# Patient Record
Sex: Female | Born: 1945
Health system: Southern US, Community
[De-identification: ages and names within clinical notes are randomized; demographics above are authoritative.]

## PROBLEM LIST (undated history)

## (undated) DIAGNOSIS — C801 Malignant (primary) neoplasm, unspecified: Secondary | ICD-10-CM

## (undated) DIAGNOSIS — K219 Gastro-esophageal reflux disease without esophagitis: Secondary | ICD-10-CM

## (undated) DIAGNOSIS — F32A Depression, unspecified: Secondary | ICD-10-CM

## (undated) DIAGNOSIS — R7303 Prediabetes: Secondary | ICD-10-CM

## (undated) DIAGNOSIS — J449 Chronic obstructive pulmonary disease, unspecified: Secondary | ICD-10-CM

## (undated) DIAGNOSIS — N189 Chronic kidney disease, unspecified: Secondary | ICD-10-CM

## (undated) DIAGNOSIS — J40 Bronchitis, not specified as acute or chronic: Secondary | ICD-10-CM

## (undated) DIAGNOSIS — R0600 Dyspnea, unspecified: Secondary | ICD-10-CM

## (undated) DIAGNOSIS — M5432 Sciatica, left side: Principal | ICD-10-CM

## (undated) DIAGNOSIS — F329 Major depressive disorder, single episode, unspecified: Secondary | ICD-10-CM

## (undated) DIAGNOSIS — I7090 Unspecified atherosclerosis: Secondary | ICD-10-CM

## (undated) DIAGNOSIS — I341 Nonrheumatic mitral (valve) prolapse: Secondary | ICD-10-CM

## (undated) DIAGNOSIS — I10 Essential (primary) hypertension: Secondary | ICD-10-CM

## (undated) DIAGNOSIS — Z720 Tobacco use: Secondary | ICD-10-CM

## (undated) DIAGNOSIS — F419 Anxiety disorder, unspecified: Secondary | ICD-10-CM

## (undated) DIAGNOSIS — J189 Pneumonia, unspecified organism: Secondary | ICD-10-CM

## (undated) DIAGNOSIS — C3490 Malignant neoplasm of unspecified part of unspecified bronchus or lung: Secondary | ICD-10-CM

## (undated) DIAGNOSIS — A809 Acute poliomyelitis, unspecified: Secondary | ICD-10-CM

## (undated) HISTORY — PX: BACK SURGERY: SHX140

## (undated) HISTORY — DX: Sciatica, left side: M54.32

## (undated) HISTORY — PX: TONSILLECTOMY: SUR1361

## (undated) HISTORY — DX: Chronic obstructive pulmonary disease, unspecified: J44.9

## (undated) HISTORY — PX: EYE SURGERY: SHX253

## (undated) HISTORY — PX: OTHER SURGICAL HISTORY: SHX169

---

## 1983-05-24 HISTORY — PX: ABDOMINAL HYSTERECTOMY: SHX81

## 1998-12-14 ENCOUNTER — Emergency Department (HOSPITAL_COMMUNITY): Admission: EM | Admit: 1998-12-14 | Discharge: 1998-12-14 | Payer: Self-pay | Admitting: Emergency Medicine

## 1999-10-27 ENCOUNTER — Emergency Department (HOSPITAL_COMMUNITY): Admission: EM | Admit: 1999-10-27 | Discharge: 1999-10-27 | Payer: Self-pay | Admitting: Emergency Medicine

## 1999-10-28 ENCOUNTER — Encounter: Payer: Self-pay | Admitting: Emergency Medicine

## 2001-01-25 ENCOUNTER — Emergency Department (HOSPITAL_COMMUNITY): Admission: EM | Admit: 2001-01-25 | Discharge: 2001-01-25 | Payer: Self-pay | Admitting: Emergency Medicine

## 2001-10-19 ENCOUNTER — Encounter: Payer: Self-pay | Admitting: Family Medicine

## 2001-10-19 ENCOUNTER — Ambulatory Visit (HOSPITAL_COMMUNITY): Admission: RE | Admit: 2001-10-19 | Discharge: 2001-10-19 | Payer: Self-pay | Admitting: Family Medicine

## 2002-03-10 ENCOUNTER — Emergency Department (HOSPITAL_COMMUNITY): Admission: EM | Admit: 2002-03-10 | Discharge: 2002-03-10 | Payer: Self-pay | Admitting: Emergency Medicine

## 2002-04-26 ENCOUNTER — Ambulatory Visit (HOSPITAL_COMMUNITY): Admission: RE | Admit: 2002-04-26 | Discharge: 2002-04-26 | Payer: Self-pay | Admitting: Orthopedic Surgery

## 2002-04-26 ENCOUNTER — Encounter: Payer: Self-pay | Admitting: Orthopedic Surgery

## 2002-05-17 ENCOUNTER — Encounter: Payer: Self-pay | Admitting: Orthopedic Surgery

## 2002-05-17 ENCOUNTER — Encounter: Admission: RE | Admit: 2002-05-17 | Discharge: 2002-05-17 | Payer: Self-pay | Admitting: Orthopedic Surgery

## 2002-05-24 ENCOUNTER — Emergency Department (HOSPITAL_COMMUNITY): Admission: EM | Admit: 2002-05-24 | Discharge: 2002-05-24 | Payer: Self-pay | Admitting: Emergency Medicine

## 2002-05-24 ENCOUNTER — Encounter: Payer: Self-pay | Admitting: Emergency Medicine

## 2002-05-31 ENCOUNTER — Encounter: Admission: RE | Admit: 2002-05-31 | Discharge: 2002-05-31 | Payer: Self-pay | Admitting: Orthopedic Surgery

## 2002-11-22 ENCOUNTER — Encounter: Payer: Self-pay | Admitting: Orthopedic Surgery

## 2002-11-22 ENCOUNTER — Encounter: Admission: RE | Admit: 2002-11-22 | Discharge: 2002-11-22 | Payer: Self-pay | Admitting: Orthopedic Surgery

## 2002-12-15 ENCOUNTER — Ambulatory Visit (HOSPITAL_COMMUNITY): Admission: RE | Admit: 2002-12-15 | Discharge: 2002-12-15 | Payer: Self-pay | Admitting: Orthopedic Surgery

## 2002-12-15 ENCOUNTER — Encounter: Payer: Self-pay | Admitting: Orthopedic Surgery

## 2003-08-13 ENCOUNTER — Other Ambulatory Visit: Admission: RE | Admit: 2003-08-13 | Discharge: 2003-08-13 | Payer: Self-pay | Admitting: Family Medicine

## 2005-05-30 ENCOUNTER — Ambulatory Visit (HOSPITAL_COMMUNITY): Admission: RE | Admit: 2005-05-30 | Discharge: 2005-05-30 | Payer: Self-pay | Admitting: Family Medicine

## 2005-08-12 ENCOUNTER — Encounter: Admission: RE | Admit: 2005-08-12 | Discharge: 2005-08-12 | Payer: Self-pay | Admitting: Orthopedic Surgery

## 2008-02-14 ENCOUNTER — Encounter: Admission: RE | Admit: 2008-02-14 | Discharge: 2008-02-14 | Payer: Self-pay | Admitting: Orthopedic Surgery

## 2009-02-06 ENCOUNTER — Emergency Department (HOSPITAL_COMMUNITY): Admission: EM | Admit: 2009-02-06 | Discharge: 2009-02-06 | Payer: Self-pay | Admitting: Emergency Medicine

## 2009-02-16 ENCOUNTER — Encounter: Admission: RE | Admit: 2009-02-16 | Discharge: 2009-02-16 | Payer: Self-pay | Admitting: Orthopedic Surgery

## 2009-03-06 ENCOUNTER — Ambulatory Visit (HOSPITAL_COMMUNITY): Admission: RE | Admit: 2009-03-06 | Discharge: 2009-03-06 | Payer: Self-pay | Admitting: Orthopedic Surgery

## 2009-04-13 ENCOUNTER — Ambulatory Visit (HOSPITAL_COMMUNITY): Admission: RE | Admit: 2009-04-13 | Discharge: 2009-04-14 | Payer: Self-pay | Admitting: Orthopaedic Surgery

## 2009-08-10 ENCOUNTER — Encounter: Admission: RE | Admit: 2009-08-10 | Discharge: 2009-10-12 | Payer: Self-pay | Admitting: Orthopaedic Surgery

## 2010-08-25 LAB — COMPREHENSIVE METABOLIC PANEL
ALT: 13 U/L (ref 0–35)
Alkaline Phosphatase: 84 U/L (ref 39–117)
BUN: 18 mg/dL (ref 6–23)
Chloride: 102 mEq/L (ref 96–112)
Creatinine, Ser: 1.11 mg/dL (ref 0.4–1.2)
GFR calc Af Amer: 60 mL/min — ABNORMAL LOW (ref >60)
Potassium: 4.2 mEq/L (ref 3.5–5.1)

## 2010-08-25 LAB — CBC
HCT: 39.3 % (ref 36.0–46.0)
Hemoglobin: 13.5 g/dL (ref 12.0–15.0)
MCHC: 34.4 g/dL (ref 30.0–36.0)
Platelets: 292 10*3/uL (ref 150–400)
WBC: 7.2 10*3/uL (ref 4.0–10.5)

## 2010-08-25 LAB — DIFFERENTIAL
Eosinophils Absolute: 0.3 10*3/uL (ref 0.0–0.7)
Lymphocytes Relative: 23 % (ref 12–46)
Monocytes Absolute: 0.8 10*3/uL (ref 0.1–1.0)
Monocytes Relative: 11 % (ref 3–12)
Neutro Abs: 4.5 10*3/uL (ref 1.7–7.7)

## 2010-12-16 ENCOUNTER — Ambulatory Visit
Admission: RE | Admit: 2010-12-16 | Discharge: 2010-12-16 | Disposition: A | Discharge status: Home / Self Care | Payer: Medicare Other | Source: Ambulatory Visit | Attending: Family Medicine | Admitting: Family Medicine

## 2010-12-16 ENCOUNTER — Other Ambulatory Visit: Payer: Self-pay | Admitting: Family Medicine

## 2010-12-16 DIAGNOSIS — R29898 Other symptoms and signs involving the musculoskeletal system: Secondary | ICD-10-CM

## 2011-02-18 ENCOUNTER — Other Ambulatory Visit (HOSPITAL_COMMUNITY): Payer: Medicare Other

## 2011-02-18 ENCOUNTER — Emergency Department (HOSPITAL_COMMUNITY): Payer: Medicare Other

## 2011-02-18 ENCOUNTER — Inpatient Hospital Stay (HOSPITAL_COMMUNITY): Payer: Medicare Other

## 2011-02-18 ENCOUNTER — Inpatient Hospital Stay (HOSPITAL_COMMUNITY)
Admission: EM | Admit: 2011-02-18 | Discharge: 2011-02-18 | DRG: 313 | Disposition: A | Discharge status: Home / Self Care | Payer: Medicare Other | Attending: Internal Medicine | Admitting: Internal Medicine

## 2011-02-18 DIAGNOSIS — F3289 Other specified depressive episodes: Secondary | ICD-10-CM | POA: Diagnosis present

## 2011-02-18 DIAGNOSIS — I10 Essential (primary) hypertension: Secondary | ICD-10-CM | POA: Diagnosis present

## 2011-02-18 DIAGNOSIS — Z79899 Other long term (current) drug therapy: Secondary | ICD-10-CM

## 2011-02-18 DIAGNOSIS — Z7982 Long term (current) use of aspirin: Secondary | ICD-10-CM

## 2011-02-18 DIAGNOSIS — R0789 Other chest pain: Principal | ICD-10-CM | POA: Diagnosis present

## 2011-02-18 DIAGNOSIS — F17200 Nicotine dependence, unspecified, uncomplicated: Secondary | ICD-10-CM | POA: Diagnosis present

## 2011-02-18 DIAGNOSIS — K219 Gastro-esophageal reflux disease without esophagitis: Secondary | ICD-10-CM | POA: Diagnosis present

## 2011-02-18 DIAGNOSIS — Z8612 Personal history of poliomyelitis: Secondary | ICD-10-CM

## 2011-02-18 DIAGNOSIS — E669 Obesity, unspecified: Secondary | ICD-10-CM | POA: Diagnosis present

## 2011-02-18 DIAGNOSIS — F329 Major depressive disorder, single episode, unspecified: Secondary | ICD-10-CM | POA: Diagnosis present

## 2011-02-18 LAB — CBC
HCT: 34.5 % — ABNORMAL LOW (ref 36.0–46.0)
MCH: 29.8 pg (ref 26.0–34.0)
MCV: 86.5 fL (ref 78.0–100.0)
Platelets: 276 10*3/uL (ref 150–400)
RBC: 3.99 MIL/uL (ref 3.87–5.11)
RDW: 14.2 % (ref 11.5–15.5)

## 2011-02-18 LAB — CARDIAC PANEL(CRET KIN+CKTOT+MB+TROPI)
CK, MB: 3.5 ng/mL (ref 0.3–4.0)
Total CK: 113 U/L (ref 7–177)
Troponin I: <0.3 ng/mL (ref <0.30)

## 2011-02-18 LAB — DIFFERENTIAL
Eosinophils Absolute: 0.2 10*3/uL (ref 0.0–0.7)
Lymphocytes Relative: 30 % (ref 12–46)
Monocytes Absolute: 0.6 10*3/uL (ref 0.1–1.0)
Neutrophils Relative %: 57 % (ref 43–77)

## 2011-02-18 LAB — LIPID PANEL
LDL Cholesterol: 177 mg/dL — ABNORMAL HIGH (ref 0–99)
Total CHOL/HDL Ratio: 6 RATIO

## 2011-02-18 LAB — LIPASE, BLOOD: Lipase: 40 U/L (ref 11–59)

## 2011-02-18 LAB — POCT I-STAT TROPONIN I: Troponin i, poc: 0.02 ng/mL (ref 0.00–0.08)

## 2011-02-18 LAB — COMPREHENSIVE METABOLIC PANEL
Albumin: 3.5 g/dL (ref 3.5–5.2)
BUN: 20 mg/dL (ref 6–23)
Chloride: 104 mEq/L (ref 96–112)
Creatinine, Ser: 1.2 mg/dL — ABNORMAL HIGH (ref 0.50–1.10)
GFR calc Af Amer: 54 mL/min — ABNORMAL LOW (ref >60)
Glucose, Bld: 93 mg/dL (ref 70–99)

## 2011-02-18 LAB — CK TOTAL AND CKMB (NOT AT ARMC)
Relative Index: 3.1 — ABNORMAL HIGH (ref 0.0–2.5)
Total CK: 113 U/L (ref 7–177)

## 2011-02-18 LAB — D-DIMER, QUANTITATIVE: D-Dimer, Quant: 0.65 ug/mL-FEU — ABNORMAL HIGH (ref 0.00–0.48)

## 2011-02-18 MED ORDER — TECHNETIUM TO 99M ALBUMIN AGGREGATED
6.0000 | Freq: Once | INTRAVENOUS | Status: AC | PRN
  Administered 2011-02-18: 6 via INTRAVENOUS

## 2011-02-18 MED ORDER — XENON XE 133 GAS
10.0000 | GAS_FOR_INHALATION | Freq: Once | RESPIRATORY_TRACT | Status: AC | PRN
  Administered 2011-02-18: 10 via RESPIRATORY_TRACT

## 2011-03-03 NOTE — Discharge Summary (Signed)
  [name], [name]                ACCOUNT NO.:  [id number]  MEDICAL RECORD NO.:  [id number]  LOCATION:  3735                         FACILITY:  [facility]  PHYSICIAN:  Gordy Savers, MDDATE OF BIRTH:  1945/08/30  DATE OF ADMISSION:  02/18/2011 DATE OF DISCHARGE:  02/18/2011                              DISCHARGE SUMMARY   FINAL DIAGNOSIS:  Atypical chest pain.  ADDITIONAL DIAGNOSES: 1. Hypertension. 2. Tobacco abuse. 3. History of gastroesophageal reflux disease.  DISCHARGE MEDICATIONS: 1. Ambien 20 mg at bedtime p.r.n. sleep. 2. Aspirin 320 mg daily. 3. Lisinopril/hydrochlorothiazide 20/25 one daily. 4. Potassium 20 mEq daily. 5. Prozac 20 mg every morning. 6. NicoDerm patch 21 sq. cm daily.  HISTORY OF PRESENT ILLNESS:  The patient is a 65 year old African American female who has history of hypertension and ongoing tobacco use. She also has a history of gastroesophageal reflux disease.  The patient was stable until 5 p.m. of the day prior to admission when she began having intermittent left-sided chest pain.  This was described as very sharp, fleeting sensation that was brief, intermittent, and occurred without provocative factors.  Pain was not particularly worse with inspiration.  The pain occurred throughout the afternoon and evening to the point where she was very anxious about sleeping.  Denies any nausea, vomiting, or shortness of breath.  Because of persistent pain, she was seen in the ED for evaluation.  LABORATORY DATA AND HOSPITAL COURSE:  The patient was initially evaluated in the ED where D-dimer was slightly elevated at 0.65. Electrocardiogram revealed sinus bradycardia and no acute abnormalities. Chest x-ray revealed no active disease.  The patient underwent a V/Q lung scan that was very low probability for pulmonary embolism.  The patient was admitted to telemetry setting and cardiac enzymes were cycled.  These were unremarkable.  Throughout the day,  she did quite well and had no significant recurrent chest pain.  While in the hospital, she received tobacco cessation counseling.  Laboratory studies were fairly unremarkable.  Lipase was 40.  Chemistries were unremarkable, and as mentioned cardiac enzymes were normal.  Creatinine was slightly elevated 1.2 with a BUN of 20, hemoglobin 11.9, hematocrit 34.5, white count normal 6.1.  DISPOSITION:  The patient was discharged today.  She was instructed to follow up with her primary care provider next week.  Cessation of smoking encouraged.  She will be treated with a NicoDerm patch 21 sq. cm daily.  She will be discharged on her preadmission medications as mentioned above.  CONDITION ON DISCHARGE:  Stable.     Gordy Savers, MD     PFK/MEDQ  D:  02/18/2011  T:  02/18/2011  Job:  [id number]  Electronically Signed by Eleonore Chiquito MD on 03/03/2011 09:15:02 AM

## 2011-03-03 NOTE — H&P (Signed)
NAMEED, RAYSON NO.:  [id number]  MEDICAL RECORD NO.:  [id number]  LOCATION:  [facility]                         FACILITY:  [facility]  PHYSICIAN:  Gordy Savers, MDDATE OF BIRTH:  1946-01-21  DATE OF ADMISSION:  02/18/2011 DATE OF DISCHARGE:                             HISTORY & PHYSICAL   CHIEF COMPLAINTS:  Chest pain.  HISTORY OF PRESENT ILLNESS:  The patient is a 65 year old African American female who was stable until 5:00 p.m. yesterday at that time she started having intermittent left-sided chest pain.  Pain was described as very sharp, fleeting and intermittent.  Pain was fluctuating throughout the afternoon and evening.  The patient states that she slept very little through the night due to pain and also anxiety about the discomfort.  She denies any symptoms of a URI.  Denies any chest congestion, productive cough or recent fever.  Pain seems paroxysmal and is described as very sharp and fleeting.  Pain comes on at rest and is not definitely aggravated by deep inspiration.  Cardiovascular risk factors include hypertension and ongoing tobacco use.  ED evaluation included cardiac enzymes.  Initial troponin 1 normal.  D-dimer was slightly elevated at 0.65.  Electrocardiogram revealed no acute changes.  Chest x-ray revealed no active disease.  The patient is now admitted for further evaluation and treatment of her atypical left-sided chest pain.  PAST MEDICAL HISTORY:  Medical problems include treated hypertension. She has a history of depression, gastroesophageal reflux disease and also history of mitral valve prolapse.  She has a history of ongoing tobacco use and has a one and one half pack daily smoker.  She has a history of polio as a child and has had operations involving her distal lower extremities.  She has been hospitalized in the past for a community-acquired pneumonia.  She has a history also of exogenous obesity.  SURGICAL  PROCEDURES:  Have included the following, she is status post left L4-L5 herniated disk, status post laminectomy by Dr. Ophelia Charter in November 2010.  She has had remote hysterectomy for fibroids, as mentioned, she has had surgery involving both legs to correct complications of polio.  SOCIAL HISTORY:  The patient is married, one and one half pack per day smoker, retired.  No regular exercise.  No history of drug or alcohol use.  FAMILY HISTORY:  Her mother died at age 49 of complications of throat cancer.  Father died in his late 30s of atherosclerotic complications. She has three siblings.  One sister deceased from breast cancer. Another sister deceased from chronic kidney disease, one living sister also has end-stage renal disease.  REVIEW OF SYSTEMS:  No fever, chills, sweats or recent change in weight. HEENT:  No history of headaches, vertigo, vision or hearing loss.  She is edentulous.  SKIN:  No rash or lesions.  CARDIOPULMONARY:  See history of present illness for history of left-sided sharp, intermittent chest pain.  Denies any cough and shortness of breath, dyspnea on exertion.  Denies any palpitations, orthopnea or PND.  No history of syncope.  She does have a history of mitral valve prolapse.  GI:  No history of nausea, vomiting or change in  her bowel habits. GENITOURINARY:  No urinary frequency, urgency or dysuria.  No hematuria. ENDOCRINE:  No history of diabetes, heat or cold intolerance.  She does state, however, that her right foot due to complications of polio is always slightly cool to touch.  PHYSICAL EXAMINATION:  GENERAL:  Revealed a well-developed healthy- appearing female in no acute distress. VITAL SIGNS:  Temperature 98.1, blood pressure 125/70, heart rate 56-64, respiratory rate 16, O2 saturation 98-100% on room air.  SKIN:  Warm and dry without rash. HEENT:  Revealed normal pupil responses.  Conjunctiva clear.  ENT unremarkable.  The patient was  edentulous. NECK:  No bruits, adenopathy or neck vein distention. CHEST:  Clear.  The patient had some slight tenderness over the left costochondral junction.  This did not reproduce her more lateral sharp, intermittent chest pain. CARDIOVASCULAR:  Normal S1-S2.  No murmurs or gallops.  No clicks noted. No irregularity. ABDOMEN:  Soft, mildly obese, soft, nontender.  No organomegaly. EXTREMITIES:  Revealed some atrophy and joint deformity of her right foot.  Pedal pulses were full on the left, but not easily palpable on her slightly atrophic right foot.  Surgical scars has been noted involving the anterior right lower leg and foot.  MUSCULOSKELETAL: Otherwise unremarkable. SKIN:  No rashes or lesions. NEURO:  Alert and oriented with normal affect and normal speech. Strength was normal.  LABORATORY STUDIES:  Included a chest x-ray that revealed no active disease.  EKG no acute changes.  Laboratory studies revealed normal chemistries, creatinine was 1.2, BUN 20, white count normal at 6.1, hemoglobin 11.9, hematocrit 34.5, D-dimer slightly elevated at 0.65, lipase normal at 40.  Initial troponin 1 of 0.01.  ASSESSMENT/PLAN: 1. Atypical chest pain, this does not sound ischemic.  We will obtain     a VQ lung scan to rule out unlikely pulmonary embolism.  The     patient has a slightly elevated creatinine of 1.2.  We will     continue to cycle enzymes.  If the above is normal we will consider     for early discharge later today or in the a.m. if stable. 2. Hypertension.  We will continue present outpatient management with     combination of diuretic and ACE inhibition. 3. Tobacco abuse.  We will obtain inpatient counseling for smoking     cessation.     Gordy Savers, MD     PFK/MEDQ  D:  02/18/2011  T:  02/18/2011  Job:  [id number]  Electronically Signed by Eleonore Chiquito MD on 03/03/2011 09:15:05 AM

## 2011-03-09 ENCOUNTER — Inpatient Hospital Stay (INDEPENDENT_AMBULATORY_CARE_PROVIDER_SITE_OTHER)
Admission: RE | Admit: 2011-03-09 | Discharge: 2011-03-09 | Disposition: A | Discharge status: Home / Self Care | Payer: Medicare Other | Source: Ambulatory Visit | Attending: Emergency Medicine | Admitting: Emergency Medicine

## 2011-03-09 DIAGNOSIS — R071 Chest pain on breathing: Secondary | ICD-10-CM

## 2011-03-10 ENCOUNTER — Other Ambulatory Visit: Payer: Self-pay | Admitting: Gastroenterology

## 2011-03-16 ENCOUNTER — Ambulatory Visit
Admission: RE | Admit: 2011-03-16 | Discharge: 2011-03-16 | Disposition: A | Discharge status: Home / Self Care | Payer: Medicare Other | Source: Ambulatory Visit | Attending: Gastroenterology | Admitting: Gastroenterology

## 2012-03-09 ENCOUNTER — Telehealth: Payer: Self-pay | Admitting: Family Medicine

## 2012-03-09 NOTE — Telephone Encounter (Signed)
error 

## 2013-04-29 ENCOUNTER — Other Ambulatory Visit: Payer: Self-pay | Admitting: Family Medicine

## 2013-04-29 ENCOUNTER — Ambulatory Visit
Admission: RE | Admit: 2013-04-29 | Discharge: 2013-04-29 | Disposition: A | Discharge status: Home / Self Care | Payer: Medicare PPO | Source: Ambulatory Visit | Attending: Family Medicine | Admitting: Family Medicine

## 2013-04-29 DIAGNOSIS — J40 Bronchitis, not specified as acute or chronic: Secondary | ICD-10-CM

## 2014-01-01 ENCOUNTER — Other Ambulatory Visit: Payer: Self-pay | Admitting: Orthopaedic Surgery

## 2014-01-01 DIAGNOSIS — M545 Low back pain, unspecified: Secondary | ICD-10-CM

## 2014-01-10 ENCOUNTER — Ambulatory Visit
Admission: RE | Admit: 2014-01-10 | Discharge: 2014-01-10 | Disposition: A | Discharge status: Home / Self Care | Payer: Medicare PPO | Source: Ambulatory Visit | Attending: Orthopaedic Surgery | Admitting: Orthopaedic Surgery

## 2014-01-10 DIAGNOSIS — M545 Low back pain, unspecified: Secondary | ICD-10-CM

## 2014-01-10 MED ORDER — GADOBENATE DIMEGLUMINE 529 MG/ML IV SOLN
15.0000 mL | Freq: Once | INTRAVENOUS | Status: AC | PRN
  Administered 2014-01-10: 15 mL via INTRAVENOUS

## 2014-01-29 ENCOUNTER — Other Ambulatory Visit (HOSPITAL_COMMUNITY): Payer: Self-pay | Admitting: Orthopaedic Surgery

## 2014-02-18 ENCOUNTER — Encounter (HOSPITAL_COMMUNITY): Payer: Self-pay | Admitting: Pharmacy Technician

## 2014-02-18 NOTE — H&P (Signed)
[name] is an 68 y.o. female.   Chief Complaint: low back and left leg pain  HPI: Pt with  low back pain  and left buttocks pain that radiates down to her left foot.  It is a burning pain.  She had previous microdiskectomy back in 2010.  She got relief with a prednisone pack back in 2013.Recent prednisone pack did not give her as much relief as she had in the previous year.  She states the pain has gotten to the point where it is unbearable.  It wakes her up at night.  She has had weakness, burning in her feet.   IMAGING:  MRI scan on 01/13/2014 with and without contrast lumbar is reviewed.  This shows some tiny disk bulges at T11-12, L1-2.  A small protrusion at L2-3 without compression.  Previous left hemilaminotomy, a small amount of scar.  No evidence of a disk recurrence.  She does have either some tiny osteophyte or a small disk bulge adjacent to the nerve root but not causing compression.  She does have a synovial cyst present at L5-S1 on the left causing mass effect on the left side of the thecal sac with compression.  She has moderate to severe lateral recess stenosis, moderate left foraminal stenosis, and combination of a facet cyst as well as facet hypertrophy.  No past medical history on file.  No past surgical history on file.  No family history on file. Social History:  has no tobacco, alcohol, and drug history on file.  Allergies: No Known Allergies  No prescriptions prior to admission    No results found for this or any previous visit (from the past 48 hour(s)). No results found.  Review of Systems  Constitutional: Negative for fever and chills.  HENT: Negative for hearing loss.   Eyes:       Glasses  Musculoskeletal: Positive for back pain and neck pain.  Skin: Negative for itching and rash.  Psychiatric/Behavioral: Negative for suicidal ideas.    There were no vitals taken for this visit. Physical Exam  Constitutional: She is oriented to person, place, and  time. She appears well-developed and well-nourished.  HENT:  Head: Normocephalic and atraumatic.  Eyes: EOM are normal. Pupils are equal, round, and reactive to light.  Cardiovascular: Normal rate.   Respiratory: Effort normal.  GI: Soft.  Musculoskeletal:  The patient is 5 feet 2 inches, 170 pounds.  She has some pain with straight leg raising on the left.  No quad, anterior tib or gastrocsoleus weakness.  Normal heel/toe gaitReflexes are 2+ and symmetrical.  Lumbar incision is well healed.  She has some sciatic notch tenderness on the left.  Negative popliteal compression test.  Distal pulses are 2+.  Neurological: She is alert and oriented to person, place, and time.  Skin: Skin is warm and dry.     Assessment/Plan Synovial cyst left L5-S1, intraspinal, extradural with nerve root compression.    Discussed options of epidural steroid injection versus laminotomy and removal of the left extradural facet cyst for decompression of the nerve root. Discussed with her at this point with the absence of instability, not recommending a fusion procedure, and she should do well with lateral recess decompression and removal of the cyst.  We discussed there is a 10% to 20% chance that cysts can sometimes recur in the future, and if it did recur over a short period of time, then fusion would be recommended.    VERNON,SHEILA M 02/18/2014, 2:50 PM

## 2014-02-20 ENCOUNTER — Encounter (HOSPITAL_COMMUNITY)
Admission: RE | Admit: 2014-02-20 | Discharge: 2014-02-20 | Disposition: A | Discharge status: Home / Self Care | Payer: Medicare PPO | Source: Ambulatory Visit | Attending: Orthopaedic Surgery | Admitting: Orthopaedic Surgery

## 2014-02-20 ENCOUNTER — Ambulatory Visit (HOSPITAL_COMMUNITY)
Admission: RE | Admit: 2014-02-20 | Discharge: 2014-02-20 | Disposition: A | Discharge status: Home / Self Care | Payer: Medicare PPO | Source: Ambulatory Visit | Attending: Orthopaedic Surgery | Admitting: Orthopaedic Surgery

## 2014-02-20 ENCOUNTER — Encounter (HOSPITAL_COMMUNITY): Payer: Self-pay

## 2014-02-20 HISTORY — DX: Gastro-esophageal reflux disease without esophagitis: K21.9

## 2014-02-20 HISTORY — DX: Nonrheumatic mitral (valve) prolapse: I34.1

## 2014-02-20 HISTORY — DX: Acute poliomyelitis, unspecified: A80.9

## 2014-02-20 HISTORY — DX: Depression, unspecified: F32.A

## 2014-02-20 HISTORY — DX: Bronchitis, not specified as acute or chronic: J40

## 2014-02-20 HISTORY — DX: Pneumonia, unspecified organism: J18.9

## 2014-02-20 HISTORY — DX: Anxiety disorder, unspecified: F41.9

## 2014-02-20 HISTORY — DX: Tobacco use: Z72.0

## 2014-02-20 HISTORY — DX: Essential (primary) hypertension: I10

## 2014-02-20 HISTORY — DX: Major depressive disorder, single episode, unspecified: F32.9

## 2014-02-20 LAB — URINALYSIS, ROUTINE W REFLEX MICROSCOPIC
Bilirubin Urine: NEGATIVE
Glucose, UA: NEGATIVE mg/dL
Hgb urine dipstick: NEGATIVE
Ketones, ur: NEGATIVE mg/dL
Nitrite: NEGATIVE
Protein, ur: NEGATIVE mg/dL
SPECIFIC GRAVITY, URINE: 1.016 (ref 1.005–1.030)
UROBILINOGEN UA: 0.2 mg/dL (ref 0.0–1.0)
pH: 5.5 (ref 5.0–8.0)

## 2014-02-20 LAB — COMPREHENSIVE METABOLIC PANEL
ALT: 14 U/L (ref 0–35)
ANION GAP: 12 (ref 5–15)
AST: 17 U/L (ref 0–37)
Albumin: 3.8 g/dL (ref 3.5–5.2)
Alkaline Phosphatase: 94 U/L (ref 39–117)
BUN: 26 mg/dL — ABNORMAL HIGH (ref 6–23)
CO2: 25 mEq/L (ref 19–32)
Calcium: 9.7 mg/dL (ref 8.4–10.5)
Chloride: 107 mEq/L (ref 96–112)
Creatinine, Ser: 1.31 mg/dL — ABNORMAL HIGH (ref 0.50–1.10)
GFR calc Af Amer: 47 mL/min — ABNORMAL LOW (ref >90)
GFR calc non Af Amer: 41 mL/min — ABNORMAL LOW (ref >90)
Glucose, Bld: 72 mg/dL (ref 70–99)
Potassium: 4.3 mEq/L (ref 3.7–5.3)
SODIUM: 144 meq/L (ref 137–147)
TOTAL PROTEIN: 7.6 g/dL (ref 6.0–8.3)
Total Bilirubin: 0.2 mg/dL — ABNORMAL LOW (ref 0.3–1.2)

## 2014-02-20 LAB — CBC
HCT: 37.7 % (ref 36.0–46.0)
HEMOGLOBIN: 12.8 g/dL (ref 12.0–15.0)
MCH: 29.6 pg (ref 26.0–34.0)
MCHC: 34 g/dL (ref 30.0–36.0)
MCV: 87.1 fL (ref 78.0–100.0)
Platelets: 304 10*3/uL (ref 150–400)
RBC: 4.33 MIL/uL (ref 3.87–5.11)
RDW: 14.4 % (ref 11.5–15.5)
WBC: 5.9 10*3/uL (ref 4.0–10.5)

## 2014-02-20 LAB — SURGICAL PCR SCREEN
MRSA, PCR: NEGATIVE
STAPHYLOCOCCUS AUREUS: NEGATIVE

## 2014-02-20 LAB — URINE MICROSCOPIC-ADD ON

## 2014-02-20 MED ORDER — CEFAZOLIN SODIUM-DEXTROSE 2-3 GM-% IV SOLR
2.0000 g | INTRAVENOUS | Status: AC
  Administered 2014-02-21: 2 g via INTRAVENOUS
  Filled 2014-02-20: qty 50

## 2014-02-20 NOTE — Pre-Procedure Instructions (Signed)
Colleen LinerLinda S Bennett  02/20/2014   Your procedure is scheduled on:  Friday February 21, 2014 at 0958 AM  [facility] at [phone] AM.  Call this number if you have problems the morning of surgery: [phone]   Remember:   Do not eat food or drink liquids after midnight.   Take these medicines the morning of surgery with A SIP OF WATER: Tylenol-codeine if needed for pain, and Prilosec.  Stop Aspirin, Mobic, Naproxen, Nsaids and herbal meds 5 days prior to surgery.   Do not wear jewelry, make-up or nail polish.  Do not wear lotions, powders, or perfumes. You may wear deodorant.  Do not shave 48 hours prior to surgery.   Do not bring valuables to the hospital.  [facility] is not responsible for any belongings or valuables.               Contacts, dentures or bridgework may not be worn into surgery.  Leave suitcase in the car. After surgery it may be brought to your room.  For patients admitted to the hospital, discharge time is determined by your  treatment team.               Patients discharged the day of surgery will not be allowed to drive home.    Special Instructions: [place] - Preparing for Surgery  Before surgery, you can play an important role.  Because skin is not sterile, your skin needs to be as free of germs as possible.  You can reduce the number of germs on you skin by washing with CHG (chlorahexidine gluconate) soap before surgery.  CHG is an antiseptic cleaner which kills germs and bonds with the skin to continue killing germs even after washing.  Please DO NOT use if you have an allergy to CHG or antibacterial soaps.  If your skin becomes reddened/irritated stop using the CHG and inform your nurse when you arrive at Short Stay.  Do not shave (including legs and underarms) for at least 48 hours prior to the first CHG shower.  You may shave your face.  Please follow these instructions carefully:   1.  Shower with CHG Soap the night before  surgery and the                                morning of Surgery.  2.  If you choose to wash your hair, wash your hair first as usual with your       normal shampoo.  3.  After you shampoo, rinse your hair and body thoroughly to remove the                      Shampoo.  4.  Use CHG as you would any other liquid soap.  You can apply chg directly       to the skin and wash gently with scrungie or a clean washcloth.  5.  Apply the CHG Soap to your body ONLY FROM THE NECK DOWN.        Do not use on open wounds or open sores.  Avoid contact with your eyes,       ears, mouth and genitals (private parts).  Wash genitals (private parts)       with your normal soap.  6.  Wash thoroughly, paying special attention to the area where your surgery  will be performed.  7.  Thoroughly rinse your body with warm water from the neck down.  8.  DO NOT shower/wash with your normal soap after using and rinsing off       the CHG Soap.  9.  Pat yourself dry with a clean towel.            10.  Wear clean pajamas.            11.  Place clean sheets on your bed the night of your first shower and do not        sleep with pets.  Day of Surgery  Do not apply any lotions/deoderants the morning of surgery.  Please wear clean clothes to the hospital/surgery center.      Please read over the following fact sheets that you were given: Pain Booklet, Coughing and Deep Breathing, MRSA Information and Surgical Site Infection Prevention

## 2014-02-21 ENCOUNTER — Inpatient Hospital Stay (HOSPITAL_COMMUNITY): Payer: Medicare PPO | Admitting: Anesthesiology

## 2014-02-21 ENCOUNTER — Encounter (HOSPITAL_COMMUNITY)
Admission: RE | Disposition: A | Discharge status: Home Health | Payer: Self-pay | Source: Ambulatory Visit | Attending: Orthopaedic Surgery

## 2014-02-21 ENCOUNTER — Encounter (HOSPITAL_COMMUNITY): Payer: Self-pay | Admitting: *Deleted

## 2014-02-21 ENCOUNTER — Encounter (HOSPITAL_COMMUNITY): Payer: Medicare PPO | Admitting: Anesthesiology

## 2014-02-21 ENCOUNTER — Inpatient Hospital Stay (HOSPITAL_COMMUNITY)
Admission: RE | Admit: 2014-02-21 | Discharge: 2014-02-22 | DRG: 520 | Disposition: A | Discharge status: Home Health | Payer: Medicare PPO | Source: Ambulatory Visit | Attending: Orthopaedic Surgery | Admitting: Orthopaedic Surgery

## 2014-02-21 ENCOUNTER — Inpatient Hospital Stay (HOSPITAL_COMMUNITY): Payer: Medicare PPO

## 2014-02-21 DIAGNOSIS — M4807 Spinal stenosis, lumbosacral region: Secondary | ICD-10-CM | POA: Diagnosis present

## 2014-02-21 DIAGNOSIS — M7138 Other bursal cyst, other site: Secondary | ICD-10-CM | POA: Diagnosis present

## 2014-02-21 DIAGNOSIS — G9619 Other disorders of meninges, not elsewhere classified: Secondary | ICD-10-CM | POA: Diagnosis present

## 2014-02-21 DIAGNOSIS — M79605 Pain in left leg: Secondary | ICD-10-CM | POA: Diagnosis present

## 2014-02-21 DIAGNOSIS — M5126 Other intervertebral disc displacement, lumbar region: Secondary | ICD-10-CM | POA: Diagnosis present

## 2014-02-21 DIAGNOSIS — M5127 Other intervertebral disc displacement, lumbosacral region: Secondary | ICD-10-CM | POA: Diagnosis present

## 2014-02-21 DIAGNOSIS — M48061 Spinal stenosis, lumbar region without neurogenic claudication: Secondary | ICD-10-CM | POA: Diagnosis present

## 2014-02-21 DIAGNOSIS — IMO0002 Reserved for concepts with insufficient information to code with codable children: Secondary | ICD-10-CM | POA: Diagnosis present

## 2014-02-21 HISTORY — PX: LUMBAR LAMINECTOMY: SHX95

## 2014-02-21 SURGERY — MICRODISCECTOMY LUMBAR LAMINECTOMY
Anesthesia: General | Site: Back | Laterality: Left

## 2014-02-21 MED ORDER — METHOCARBAMOL 500 MG PO TABS
500.0000 mg | ORAL_TABLET | Freq: Four times a day (QID) | ORAL | Status: DC | PRN
  Administered 2014-02-21 – 2014-02-22 (×2): 500 mg via ORAL
  Filled 2014-02-21 (×2): qty 1

## 2014-02-21 MED ORDER — HYDROMORPHONE HCL 1 MG/ML IJ SOLN
INTRAMUSCULAR | Status: AC
  Filled 2014-02-21: qty 1

## 2014-02-21 MED ORDER — CEFAZOLIN SODIUM 1-5 GM-% IV SOLN
1.0000 g | Freq: Three times a day (TID) | INTRAVENOUS | Status: AC
  Administered 2014-02-21 – 2014-02-22 (×2): 1 g via INTRAVENOUS
  Filled 2014-02-21 (×2): qty 50

## 2014-02-21 MED ORDER — GLYCOPYRROLATE 0.2 MG/ML IJ SOLN
INTRAMUSCULAR | Status: DC | PRN
  Administered 2014-02-21: 0.6 mg via INTRAVENOUS

## 2014-02-21 MED ORDER — ARTIFICIAL TEARS OP OINT
TOPICAL_OINTMENT | OPHTHALMIC | Status: DC | PRN
  Administered 2014-02-21: 1 via OPHTHALMIC

## 2014-02-21 MED ORDER — LIDOCAINE HCL (CARDIAC) 20 MG/ML IV SOLN
INTRAVENOUS | Status: DC | PRN
  Administered 2014-02-21: 70 mg via INTRAVENOUS

## 2014-02-21 MED ORDER — SODIUM CHLORIDE 0.9 % IJ SOLN: 3.0000 mL | INTRAMUSCULAR | Status: DC | PRN

## 2014-02-21 MED ORDER — ROCURONIUM BROMIDE 50 MG/5ML IV SOLN
INTRAVENOUS | Status: AC
  Filled 2014-02-21: qty 1

## 2014-02-21 MED ORDER — PROPOFOL 10 MG/ML IV BOLUS
INTRAVENOUS | Status: DC | PRN
  Administered 2014-02-21: 30 mg via INTRAVENOUS
  Administered 2014-02-21: 160 mg via INTRAVENOUS
  Administered 2014-02-21: 40 mg via INTRAVENOUS

## 2014-02-21 MED ORDER — ACETAMINOPHEN 325 MG PO TABS: 650.0000 mg | ORAL_TABLET | ORAL | Status: DC | PRN

## 2014-02-21 MED ORDER — SODIUM CHLORIDE 0.9 % IV SOLN: 250.0000 mL | INTRAVENOUS | Status: DC

## 2014-02-21 MED ORDER — POLYETHYLENE GLYCOL 3350 17 G PO PACK: 17.0000 g | PACK | Freq: Every day | ORAL | Status: DC | PRN

## 2014-02-21 MED ORDER — FENTANYL CITRATE 0.05 MG/ML IJ SOLN
INTRAMUSCULAR | Status: AC
  Filled 2014-02-21: qty 5

## 2014-02-21 MED ORDER — ONDANSETRON HCL 4 MG/2ML IJ SOLN
INTRAMUSCULAR | Status: AC
  Filled 2014-02-21: qty 2

## 2014-02-21 MED ORDER — SODIUM CHLORIDE 0.9 % IJ SOLN
3.0000 mL | Freq: Two times a day (BID) | INTRAMUSCULAR | Status: DC
  Administered 2014-02-21 – 2014-02-22 (×2): 3 mL via INTRAVENOUS

## 2014-02-21 MED ORDER — ACETAMINOPHEN 650 MG RE SUPP: 650.0000 mg | RECTAL | Status: DC | PRN

## 2014-02-21 MED ORDER — DEXTROSE 5 % IV SOLN
500.0000 mg | Freq: Four times a day (QID) | INTRAVENOUS | Status: DC | PRN
  Filled 2014-02-21: qty 5

## 2014-02-21 MED ORDER — MIDAZOLAM HCL 5 MG/5ML IJ SOLN
INTRAMUSCULAR | Status: DC | PRN
  Administered 2014-02-21 (×2): 1 mg via INTRAVENOUS

## 2014-02-21 MED ORDER — ZOLPIDEM TARTRATE 5 MG PO TABS: 10.0000 mg | ORAL_TABLET | Freq: Every evening | ORAL | Status: DC | PRN

## 2014-02-21 MED ORDER — EPHEDRINE SULFATE 50 MG/ML IJ SOLN
INTRAMUSCULAR | Status: DC | PRN
  Administered 2014-02-21 (×2): 10 mg via INTRAVENOUS

## 2014-02-21 MED ORDER — KETOROLAC TROMETHAMINE 30 MG/ML IJ SOLN
INTRAMUSCULAR | Status: AC
  Filled 2014-02-21: qty 1

## 2014-02-21 MED ORDER — ARTIFICIAL TEARS OP OINT
TOPICAL_OINTMENT | OPHTHALMIC | Status: AC
  Filled 2014-02-21: qty 3.5

## 2014-02-21 MED ORDER — DEXAMETHASONE SODIUM PHOSPHATE 4 MG/ML IJ SOLN
INTRAMUSCULAR | Status: DC | PRN
  Administered 2014-02-21: 4 mg via INTRAVENOUS

## 2014-02-21 MED ORDER — ONDANSETRON HCL 4 MG/2ML IJ SOLN: 4.0000 mg | INTRAMUSCULAR | Status: DC | PRN

## 2014-02-21 MED ORDER — HYDROCHLOROTHIAZIDE 25 MG PO TABS
25.0000 mg | ORAL_TABLET | Freq: Every day | ORAL | Status: DC
  Administered 2014-02-21 – 2014-02-22 (×2): 25 mg via ORAL
  Filled 2014-02-21 (×2): qty 1

## 2014-02-21 MED ORDER — LIDOCAINE HCL (CARDIAC) 20 MG/ML IV SOLN
INTRAVENOUS | Status: AC
  Filled 2014-02-21: qty 5

## 2014-02-21 MED ORDER — LOSARTAN POTASSIUM 50 MG PO TABS
100.0000 mg | ORAL_TABLET | Freq: Every day | ORAL | Status: DC
  Administered 2014-02-21 – 2014-02-22 (×2): 100 mg via ORAL
  Filled 2014-02-21 (×2): qty 2

## 2014-02-21 MED ORDER — LACTATED RINGERS IV SOLN
INTRAVENOUS | Status: DC | PRN
  Administered 2014-02-21 (×2): via INTRAVENOUS

## 2014-02-21 MED ORDER — HYDROMORPHONE HCL 1 MG/ML IJ SOLN
0.2500 mg | INTRAMUSCULAR | Status: DC | PRN
  Administered 2014-02-21: 0.5 mg via INTRAVENOUS

## 2014-02-21 MED ORDER — MENTHOL 3 MG MT LOZG: 1.0000 | LOZENGE | OROMUCOSAL | Status: DC | PRN

## 2014-02-21 MED ORDER — KETOROLAC TROMETHAMINE 30 MG/ML IJ SOLN
30.0000 mg | Freq: Four times a day (QID) | INTRAMUSCULAR | Status: AC
  Administered 2014-02-21 – 2014-02-22 (×4): 30 mg via INTRAVENOUS
  Filled 2014-02-21 (×3): qty 1

## 2014-02-21 MED ORDER — ASPIRIN EC 81 MG PO TBEC
81.0000 mg | DELAYED_RELEASE_TABLET | Freq: Every day | ORAL | Status: DC
  Administered 2014-02-22: 81 mg via ORAL
  Filled 2014-02-21: qty 1

## 2014-02-21 MED ORDER — MORPHINE SULFATE 2 MG/ML IJ SOLN: 1.0000 mg | INTRAMUSCULAR | Status: DC | PRN

## 2014-02-21 MED ORDER — METHOCARBAMOL 500 MG PO TABS: 500.0000 mg | ORAL_TABLET | Freq: Four times a day (QID) | ORAL | Status: DC | PRN

## 2014-02-21 MED ORDER — LOSARTAN POTASSIUM-HCTZ 100-25 MG PO TABS: 1.0000 | ORAL_TABLET | Freq: Every day | ORAL | Status: DC

## 2014-02-21 MED ORDER — DOCUSATE SODIUM 100 MG PO CAPS
100.0000 mg | ORAL_CAPSULE | Freq: Two times a day (BID) | ORAL | Status: DC
  Administered 2014-02-21 – 2014-02-22 (×3): 100 mg via ORAL
  Filled 2014-02-21 (×3): qty 1

## 2014-02-21 MED ORDER — PROPOFOL 10 MG/ML IV BOLUS
INTRAVENOUS | Status: AC
  Filled 2014-02-21: qty 20

## 2014-02-21 MED ORDER — ONDANSETRON HCL 4 MG/2ML IJ SOLN
INTRAMUSCULAR | Status: DC | PRN
  Administered 2014-02-21: 4 mg via INTRAVENOUS

## 2014-02-21 MED ORDER — KCL IN DEXTROSE-NACL 20-5-0.45 MEQ/L-%-% IV SOLN
INTRAVENOUS | Status: DC
  Administered 2014-02-21: 16:00:00 via INTRAVENOUS
  Filled 2014-02-21 (×3): qty 1000

## 2014-02-21 MED ORDER — OXYCODONE-ACETAMINOPHEN 5-325 MG PO TABS
1.0000 | ORAL_TABLET | ORAL | Status: DC | PRN
  Administered 2014-02-21 – 2014-02-22 (×3): 2 via ORAL
  Filled 2014-02-21 (×3): qty 2

## 2014-02-21 MED ORDER — ROCURONIUM BROMIDE 100 MG/10ML IV SOLN
INTRAVENOUS | Status: DC | PRN
  Administered 2014-02-21: 40 mg via INTRAVENOUS

## 2014-02-21 MED ORDER — FLEET ENEMA 7-19 GM/118ML RE ENEM
1.0000 | ENEMA | Freq: Once | RECTAL | Status: AC | PRN
Start: 2014-02-21 — End: 2014-02-21

## 2014-02-21 MED ORDER — LACTATED RINGERS IV SOLN
INTRAVENOUS | Status: DC
  Administered 2014-02-21: 09:00:00 via INTRAVENOUS

## 2014-02-21 MED ORDER — MIDAZOLAM HCL 2 MG/2ML IJ SOLN
INTRAMUSCULAR | Status: AC
  Filled 2014-02-21: qty 2

## 2014-02-21 MED ORDER — PHENYLEPHRINE HCL 10 MG/ML IJ SOLN
INTRAMUSCULAR | Status: DC | PRN
  Administered 2014-02-21: 80 ug via INTRAVENOUS
  Administered 2014-02-21: 40 ug via INTRAVENOUS
  Administered 2014-02-21: 80 ug via INTRAVENOUS
  Administered 2014-02-21: 40 ug via INTRAVENOUS

## 2014-02-21 MED ORDER — FENTANYL CITRATE 0.05 MG/ML IJ SOLN
INTRAMUSCULAR | Status: DC | PRN
  Administered 2014-02-21: 25 ug via INTRAVENOUS
  Administered 2014-02-21: 100 ug via INTRAVENOUS
  Administered 2014-02-21: 25 ug via INTRAVENOUS
  Administered 2014-02-21 (×2): 50 ug via INTRAVENOUS

## 2014-02-21 MED ORDER — BISACODYL 10 MG RE SUPP: 10.0000 mg | Freq: Every day | RECTAL | Status: DC | PRN

## 2014-02-21 MED ORDER — BUPIVACAINE-EPINEPHRINE (PF) 0.25% -1:200000 IJ SOLN
INTRAMUSCULAR | Status: AC
  Filled 2014-02-21: qty 30

## 2014-02-21 MED ORDER — HYDROCODONE-ACETAMINOPHEN 5-325 MG PO TABS
1.0000 | ORAL_TABLET | ORAL | Status: DC | PRN
  Filled 2014-02-21: qty 2

## 2014-02-21 MED ORDER — PANTOPRAZOLE SODIUM 40 MG PO TBEC
40.0000 mg | DELAYED_RELEASE_TABLET | Freq: Every day | ORAL | Status: DC
  Administered 2014-02-21 – 2014-02-22 (×2): 40 mg via ORAL
  Filled 2014-02-21 (×2): qty 1

## 2014-02-21 MED ORDER — 0.9 % SODIUM CHLORIDE (POUR BTL) OPTIME
TOPICAL | Status: DC | PRN
  Administered 2014-02-21: 1000 mL

## 2014-02-21 MED ORDER — OXYCODONE-ACETAMINOPHEN 5-325 MG PO TABS: 1.0000 | ORAL_TABLET | ORAL | Status: DC | PRN

## 2014-02-21 MED ORDER — PHENOL 1.4 % MT LIQD: 1.0000 | OROMUCOSAL | Status: DC | PRN

## 2014-02-21 MED ORDER — ZOLPIDEM TARTRATE 5 MG PO TABS
5.0000 mg | ORAL_TABLET | Freq: Every evening | ORAL | Status: DC | PRN
  Administered 2014-02-22: 5 mg via ORAL
  Filled 2014-02-21: qty 1

## 2014-02-21 MED ORDER — PHENYLEPHRINE 40 MCG/ML (10ML) SYRINGE FOR IV PUSH (FOR BLOOD PRESSURE SUPPORT)
PREFILLED_SYRINGE | INTRAVENOUS | Status: AC
  Filled 2014-02-21: qty 10

## 2014-02-21 MED ORDER — NEOSTIGMINE METHYLSULFATE 10 MG/10ML IV SOLN
INTRAVENOUS | Status: DC | PRN
  Administered 2014-02-21: 5 mg via INTRAVENOUS

## 2014-02-21 SURGICAL SUPPLY — 45 items
BUR ROUND FLUTED 4 SOFT TCH (BURR) ×2 IMPLANT
CANISTER SUCTION WELLS/JOHNSON (MISCELLANEOUS) ×2 IMPLANT
CORDS BIPOLAR (ELECTRODE) ×2 IMPLANT
COVER SURGICAL LIGHT HANDLE (MISCELLANEOUS) ×2 IMPLANT
DERMABOND ADVANCED (GAUZE/BANDAGES/DRESSINGS) ×1
DERMABOND ADVANCED .7 DNX12 (GAUZE/BANDAGES/DRESSINGS) ×1 IMPLANT
DRAPE MICROSCOPE LEICA (MISCELLANEOUS) ×2 IMPLANT
DRAPE PROXIMA HALF (DRAPES) IMPLANT
DRSG EMULSION OIL 3X3 NADH (GAUZE/BANDAGES/DRESSINGS) IMPLANT
DRSG MEPILEX BORDER 4X4 (GAUZE/BANDAGES/DRESSINGS) ×2 IMPLANT
DRSG MEPILEX BORDER 4X8 (GAUZE/BANDAGES/DRESSINGS) IMPLANT
DURAPREP 26ML APPLICATOR (WOUND CARE) ×2 IMPLANT
DURASEAL SPINE SEALANT 3ML (MISCELLANEOUS) IMPLANT
ELECT REM PT RETURN 9FT ADLT (ELECTROSURGICAL) ×2
ELECTRODE REM PT RTRN 9FT ADLT (ELECTROSURGICAL) ×1 IMPLANT
GAUZE SPONGE 4X4 12PLY STRL (GAUZE/BANDAGES/DRESSINGS) IMPLANT
GLOVE BIOGEL PI IND STRL 7.5 (GLOVE) ×1 IMPLANT
GLOVE BIOGEL PI IND STRL 8 (GLOVE) ×1 IMPLANT
GLOVE BIOGEL PI INDICATOR 7.5 (GLOVE) ×1
GLOVE BIOGEL PI INDICATOR 8 (GLOVE) ×1
GLOVE ECLIPSE 7.0 STRL STRAW (GLOVE) ×2 IMPLANT
GLOVE ORTHO TXT STRL SZ7.5 (GLOVE) ×2 IMPLANT
GOWN STRL REUS W/ TWL LRG LVL3 (GOWN DISPOSABLE) ×3 IMPLANT
GOWN STRL REUS W/TWL LRG LVL3 (GOWN DISPOSABLE) ×3
KIT BASIN OR (CUSTOM PROCEDURE TRAY) ×2 IMPLANT
KIT ROOM TURNOVER OR (KITS) ×2 IMPLANT
MANIFOLD NEPTUNE II (INSTRUMENTS) IMPLANT
NDL SUT .5 MAYO 1.404X.05X (NEEDLE) IMPLANT
NEEDLE 22X1 1/2 (OR ONLY) (NEEDLE) ×2 IMPLANT
NEEDLE MAYO TAPER (NEEDLE)
NEEDLE SPNL 18GX3.5 QUINCKE PK (NEEDLE) ×2 IMPLANT
NS IRRIG 1000ML POUR BTL (IV SOLUTION) ×2 IMPLANT
PACK LAMINECTOMY ORTHO (CUSTOM PROCEDURE TRAY) ×2 IMPLANT
PAD ARMBOARD 7.5X6 YLW CONV (MISCELLANEOUS) ×4 IMPLANT
PATTIES SURGICAL .5 X.5 (GAUZE/BANDAGES/DRESSINGS) IMPLANT
PATTIES SURGICAL .75X.75 (GAUZE/BANDAGES/DRESSINGS) IMPLANT
SUT VIC AB 2-0 CT1 27 (SUTURE) ×1
SUT VIC AB 2-0 CT1 TAPERPNT 27 (SUTURE) ×1 IMPLANT
SUT VICRYL 0 TIES 12 18 (SUTURE) IMPLANT
SUT VICRYL 4-0 PS2 18IN ABS (SUTURE) ×2 IMPLANT
SUT VICRYL AB 2 0 TIES (SUTURE) IMPLANT
SYR 20ML ECCENTRIC (SYRINGE) IMPLANT
SYR CONTROL 10ML LL (SYRINGE) IMPLANT
TOWEL OR 17X24 6PK STRL BLUE (TOWEL DISPOSABLE) ×2 IMPLANT
TOWEL OR 17X26 10 PK STRL BLUE (TOWEL DISPOSABLE) ×2 IMPLANT

## 2014-02-21 NOTE — Anesthesia Preprocedure Evaluation (Signed)
Anesthesia Evaluation  Patient identified by MRN, date of birth, ID band Patient awake    Reviewed: Allergy & Precautions, H&P , NPO status , Patient's Chart, lab work & pertinent test results  Airway Mallampati: II      Dental   Pulmonary pneumonia -, former smoker,          Cardiovascular hypertension,     Neuro/Psych    GI/Hepatic Neg liver ROS, GERD-  ,  Endo/Other  negative endocrine ROS  Renal/GU negative Renal ROS     Musculoskeletal   Abdominal   Peds  Hematology   Anesthesia Other Findings   Reproductive/Obstetrics                           Anesthesia Physical Anesthesia Plan  ASA: III  Anesthesia Plan: General   Post-op Pain Management:    Induction: Intravenous  Airway Management Planned: Oral ETT  Additional Equipment:   Intra-op Plan:   Post-operative Plan: Extubation in OR  Informed Consent: I have reviewed the patients History and Physical, chart, labs and discussed the procedure including the risks, benefits and alternatives for the proposed anesthesia with the patient or authorized representative who has indicated his/her understanding and acceptance.   Dental advisory given  Plan Discussed with: CRNA and Anesthesiologist  Anesthesia Plan Comments:         Anesthesia Quick Evaluation

## 2014-02-21 NOTE — Brief Op Note (Signed)
02/21/2014  11:44 AM  PATIENT:  Colleen LinerLinda S Bennett  68 y.o. female  PRE-OPERATIVE DIAGNOSIS:  Left L5-S1 Foraminal Stenosis, Intraspinal Extradural Cyst  POST-OPERATIVE DIAGNOSIS:  Left L5-S1 Foraminal Stenosis, Intraspinal Extradural Cyst  PROCEDURE:  Procedure(s): Left L5-S1 Hemilaminectomy, Lateral Recess Decompression, Removal Synovial Cyst (Left)  SURGEON:  Surgeon(s) and Role:    * Eldred MangesMark C Yates, MD - Primary  PHYSICIAN ASSISTANT: Maud DeedSheila Solash Tullo  PAC  ASSISTANTS: none   ANESTHESIA:   general  EBL:  Total I/O In: 1600 [I.V.:1600] Out: 25 [Blood:25]  BLOOD ADMINISTERED:none  DRAINS: none   LOCAL MEDICATIONS USED:  NONE  SPECIMEN:  No Specimen  DISPOSITION OF SPECIMEN:  N/A  COUNTS:  YES  TOURNIQUET:  * No tourniquets in log *  DICTATION: .Note written in paper chart and Note written in EPIC  PLAN OF CARE: Admit for overnight observation  PATIENT DISPOSITION:  PACU - hemodynamically stable.   Delay start of Pharmacological VTE agent (>24hrs) due to surgical blood loss or risk of bleeding: yes

## 2014-02-21 NOTE — Progress Notes (Signed)
Utilization review completed.  

## 2014-02-21 NOTE — Anesthesia Postprocedure Evaluation (Signed)
  Anesthesia Post-op Note  Patient: Colleen LinerLinda S Brannum  Procedure(s) Performed: Procedure(s): Left L5-S1 Hemilaminectomy, Lateral Recess Decompression, Removal Synovial Cyst (Left)  Patient Location: PACU  Anesthesia Type:General  Level of Consciousness: awake  Airway and Oxygen Therapy: Patient Spontanous Breathing  Post-op Pain: mild  Post-op Assessment: Post-op Vital signs reviewed  Post-op Vital Signs: Reviewed  Last Vitals:  Filed Vitals:   02/21/14 1325  BP: 100/52  Pulse: 62  Temp: 36.4 C  Resp: 16    Complications: No apparent anesthesia complications

## 2014-02-21 NOTE — Interval H&P Note (Signed)
History and Physical Interval Note:  02/21/2014 9:30 AM  Colleen LinerLinda S Busbin  has presented today for surgery, with the diagnosis of Left L5-S1 Foraminal Stenosis, Intraspinal Extradural Cyst  The various methods of treatment have been discussed with the patient and family. After consideration of risks, benefits and other options for treatment, the patient has consented to  Procedure(s): Left L5-S1 Hemilaminectomy, Lateral Recess Decompression, Removal Synovial Cyst (Left) as a surgical intervention .  The patient's history has been reviewed, patient examined, no change in status, stable for surgery.  I have reviewed the patient's chart and labs.  Questions were answered to the patient's satisfaction.     YATES,MARK C

## 2014-02-21 NOTE — Anesthesia Procedure Notes (Signed)
Procedure Name: Intubation Date/Time: 02/21/2014 10:23 AM Performed by: Lovie CholOCK, Graylee Arutyunyan K Pre-anesthesia Checklist: Patient identified, Emergency Drugs available, Suction available, Patient being monitored and Timeout performed Patient Re-evaluated:Patient Re-evaluated prior to inductionOxygen Delivery Method: Circle system utilized Preoxygenation: Pre-oxygenation with 100% oxygen Intubation Type: IV induction Ventilation: Mask ventilation without difficulty and Oral airway inserted - appropriate to patient size Laryngoscope Size: Mac and 3 Grade View: Grade I Tube type: Oral Tube size: 7.0 mm Number of attempts: 1 Airway Equipment and Method: Stylet Placement Confirmation: ETT inserted through vocal cords under direct vision,  positive ETCO2,  CO2 detector and breath sounds checked- equal and bilateral Secured at: 22 cm Tube secured with: Tape Dental Injury: Teeth and Oropharynx as per pre-operative assessment  Comments: By Lorin PicketScott - paramedic student

## 2014-02-21 NOTE — Transfer of Care (Signed)
Immediate Anesthesia Transfer of Care Note  Patient: Colleen LinerLinda S Kashuba  Procedure(s) Performed: Procedure(s): Left L5-S1 Hemilaminectomy, Lateral Recess Decompression, Removal Synovial Cyst (Left)  Patient Location: PACU  Anesthesia Type:General  Level of Consciousness: awake, oriented and patient cooperative  Airway & Oxygen Therapy: Patient Spontanous Breathing and Patient connected to face mask oxygen  Post-op Assessment: Report given to PACU RN and Post -op Vital signs reviewed and stable  Post vital signs: Reviewed  Complications: No apparent anesthesia complications

## 2014-02-21 NOTE — Discharge Instructions (Signed)
No lifting greater than 10 lbs. Avoid bending, stooping and twisting. Walk in house for first week them may start to get out slowly increasing distance up to one mile by 3 weeks post op. Keep incision dry for 3 days, may use tegaderm or similar water impervious dressing. Change dressing daily or as needed. Ice packs daily or as needed for pain and swelling.

## 2014-02-22 MED ORDER — DIAZEPAM 5 MG PO TABS: 5.0000 mg | ORAL_TABLET | Freq: Two times a day (BID) | ORAL | Status: DC | PRN

## 2014-02-22 NOTE — Op Note (Signed)
NAMSkeet Simmer:  Friscia, Geanette                ACCOUNT NO.:  [id number]  MEDICAL RECORD NO.:  [id number]  LOCATION:  [id number]                        FACILITY:  [facility]  PHYSICIAN:  Marya Lowden C. Ophelia CharterYates, M.D.    DATE OF BIRTH:  [birth year]  DATE OF PROCEDURE:  02/21/2014 DATE OF DISCHARGE:                              OPERATIVE REPORT   PREOPERATIVE DIAGNOSIS:  L5-S1 disk protrusion with intraspinal extradural facet cyst and radiculopathy.  POSTOPERATIVE DIAGNOSIS:  L5-S1 disk protrusion with intraspinal extradural facet cyst and radiculopathy.  PROCEDURE:  Left L5 hemilaminectomy, excision of intraspinal extradural facet cyst, L5-S1 microdiskectomy.  SURGEON:  Amore Ackman C. Ophelia CharterYates, MD  ASSISTANT:  Maud DeedSheila Vernon, PA-C, medically necessary and present for the entire procedure.  ESTIMATED BLOOD LOSS:  Minimal.  COMPLICATIONS:  None.  DRAINS:  None.  DESCRIPTION OF PROCEDURE:  After induction of general anesthesia, the patient was placed prone on chest rolls, careful padding, ulnar nerve, shoulder rolls, and pillows underneath the ankles.  Calf bumpers were applied.  Back was prepped with DuraPrep.  Area was squared with towels, Betadine, Steri-Drape applied.  Needle localization, cross-table lateral confirmed, needle at the appropriate level at 5-1, she had already had previous procedure at L4-5.  MRI scan from last month showed a facet cyst that causing compression with chronic endplate changes and disk protrusion at L5-S1.  She recently had increased right leg symptoms.  After confirmational x-ray, time-out procedure, midline incision was made subperiosteal dissection out to the facet and placement of Taylor retractor laterally.  Ines BloomerBurr was used to thin the lamina of L5; 2 mm Kerrison, and 3 mm Kerrison was used to remove remaining deeper portion of the lamina performing a hemilaminectomy on the left at L5.  Top portion of S1 was removed.  There were overhanging facets, these were carefully removed.   Thick chunks of ligament which were removed.  Cyst was present more cephalad than the disk space and appeared yellowish brown.  It was attached to the dura and not attached to the facet. Overhanging spur has been removed at the level of the pedicle and __________ was visualized.  Some veins were coagulated with the bipolar. There was concern that the patient's previous injections that might have been durotomy with partial tear of the dura with small bubble present that appeared to be the cyst.  There were some chunks of ligament and a portion of the cyst could be peeled away from the dura and was removed. This was large protruding.  Anulus was incised.  Passes were made with the Epstein curette, micropituitary, regular pituitary, removing chunks of disk until the nerve root was decompressed.  Some of the disk protrusion was off to the left and this was pushed down with the Epstein curette and then fragments of disk were removed.  Continued looking at the cyst and with the __________ carefully protecting some remaining. Lateral ligament was removed and the cyst decompressed itself.  Portions peeled off the dura.  Had a typical ganglion type fluid present.  Hockey stick was placed anterior to the dura.  Palpation of the lateral gutter and caudad and out underneath the pedicle of S1, there were no areas of remaining compression.  Nerve root was free, and with the cyst decompressed, this relieved the L5 nerve root pressure. Lateral recess was decompressed.  Irrigation with saline solution, closure with 0 Vicryl in the deep fascia, 2-0 Vicryl, subcuticular closure.  Dermabond on the skin.  Postop dressing and transferred to the recovery room.  Instrument count and needle count was correct.     Rainelle Sulewski C. Ophelia Charter, M.D.     MCY/MEDQ  D:  02/21/2014  T:  02/21/2014  Job:  [id number]

## 2014-02-22 NOTE — Progress Notes (Signed)
Subjective: 1 Day Post-Op Procedure(s) (LRB): Left L5-S1 Hemilaminectomy, Lateral Recess Decompression, Removal Synovial Cyst (Left) Patient reports pain as mild.    Objective: Vital signs in last 24 hours: Temp:  [97.4 F (36.3 C)-98.6 F (37 C)] 98.6 F (37 C) (10/03 0539) Pulse Rate:  [62-91] 72 (10/03 0539) Resp:  [13-20] 18 (10/03 0539) BP: (100-127)/(50-62) 125/62 mmHg (10/03 0539) SpO2:  [92 %-100 %] 95 % (10/03 0539) Weight:  [78.019 kg (172 lb)] 78.019 kg (172 lb) (10/02 2047)  Intake/Output from previous day: 10/02 0701 - 10/03 0700 In: 2920 [P.O.:600; I.V.:2320] Out: 27 [Urine:2; Blood:25] Intake/Output this shift:     Recent Labs  02/20/14 1352  HGB 12.8    Recent Labs  02/20/14 1352  WBC 5.9  RBC 4.33  HCT 37.7  PLT 304    Recent Labs  02/20/14 1352  NA 144  K 4.3  CL 107  CO2 25  BUN 26*  CREATININE 1.31*  GLUCOSE 72  CALCIUM 9.7   No results found for this basename: LABPT, INR,  in the last 72 hours  Neurologically intact ABD soft Neurovascular intact Sensation intact distally Incision: dressing C/D/I Compartment soft  Assessment/Plan: 1 Day Post-Op Procedure(s) (LRB): Left L5-S1 Hemilaminectomy, Lateral Recess Decompression, Removal Synovial Cyst (Left) Advance diet Up with therapy Discharge home with home health as needed Valium rx written  [facility] Hospital[facility]ARCA,Latori Beggs 02/22/2014, 9:43 AM

## 2014-02-23 ENCOUNTER — Encounter (HOSPITAL_COMMUNITY): Payer: Self-pay | Admitting: Emergency Medicine

## 2014-02-23 ENCOUNTER — Emergency Department (HOSPITAL_COMMUNITY): Payer: Medicare PPO

## 2014-02-23 ENCOUNTER — Emergency Department (HOSPITAL_COMMUNITY)
Admission: EM | Admit: 2014-02-23 | Discharge: 2014-02-23 | Disposition: A | Discharge status: Home / Self Care | Payer: Medicare PPO | Attending: Emergency Medicine | Admitting: Emergency Medicine

## 2014-02-23 DIAGNOSIS — K219 Gastro-esophageal reflux disease without esophagitis: Secondary | ICD-10-CM | POA: Diagnosis not present

## 2014-02-23 DIAGNOSIS — Z8701 Personal history of pneumonia (recurrent): Secondary | ICD-10-CM | POA: Diagnosis not present

## 2014-02-23 DIAGNOSIS — Z8679 Personal history of other diseases of the circulatory system: Secondary | ICD-10-CM | POA: Insufficient documentation

## 2014-02-23 DIAGNOSIS — Z4801 Encounter for change or removal of surgical wound dressing: Secondary | ICD-10-CM | POA: Diagnosis not present

## 2014-02-23 DIAGNOSIS — R918 Other nonspecific abnormal finding of lung field: Secondary | ICD-10-CM | POA: Diagnosis not present

## 2014-02-23 DIAGNOSIS — Z90710 Acquired absence of both cervix and uterus: Secondary | ICD-10-CM | POA: Diagnosis not present

## 2014-02-23 DIAGNOSIS — Z87891 Personal history of nicotine dependence: Secondary | ICD-10-CM | POA: Diagnosis not present

## 2014-02-23 DIAGNOSIS — G8918 Other acute postprocedural pain: Secondary | ICD-10-CM | POA: Diagnosis not present

## 2014-02-23 DIAGNOSIS — Z9089 Acquired absence of other organs: Secondary | ICD-10-CM | POA: Diagnosis not present

## 2014-02-23 DIAGNOSIS — M545 Low back pain: Secondary | ICD-10-CM | POA: Insufficient documentation

## 2014-02-23 DIAGNOSIS — Z8612 Personal history of poliomyelitis: Secondary | ICD-10-CM | POA: Diagnosis not present

## 2014-02-23 DIAGNOSIS — Z7982 Long term (current) use of aspirin: Secondary | ICD-10-CM | POA: Insufficient documentation

## 2014-02-23 DIAGNOSIS — Z8709 Personal history of other diseases of the respiratory system: Secondary | ICD-10-CM | POA: Diagnosis not present

## 2014-02-23 DIAGNOSIS — R0782 Intercostal pain: Secondary | ICD-10-CM | POA: Diagnosis present

## 2014-02-23 DIAGNOSIS — I10 Essential (primary) hypertension: Secondary | ICD-10-CM | POA: Insufficient documentation

## 2014-02-23 DIAGNOSIS — Z5189 Encounter for other specified aftercare: Secondary | ICD-10-CM

## 2014-02-23 DIAGNOSIS — F419 Anxiety disorder, unspecified: Secondary | ICD-10-CM | POA: Insufficient documentation

## 2014-02-23 DIAGNOSIS — Z79899 Other long term (current) drug therapy: Secondary | ICD-10-CM | POA: Insufficient documentation

## 2014-02-23 DIAGNOSIS — F329 Major depressive disorder, single episode, unspecified: Secondary | ICD-10-CM | POA: Diagnosis not present

## 2014-02-23 DIAGNOSIS — R079 Chest pain, unspecified: Secondary | ICD-10-CM

## 2014-02-23 LAB — CBC
HCT: 34 % — ABNORMAL LOW (ref 36.0–46.0)
HEMOGLOBIN: 11.4 g/dL — AB (ref 12.0–15.0)
MCH: 29.4 pg (ref 26.0–34.0)
MCHC: 33.5 g/dL (ref 30.0–36.0)
MCV: 87.6 fL (ref 78.0–100.0)
PLATELETS: 280 10*3/uL (ref 150–400)
RBC: 3.88 MIL/uL (ref 3.87–5.11)
RDW: 14.7 % (ref 11.5–15.5)
WBC: 7 10*3/uL (ref 4.0–10.5)

## 2014-02-23 LAB — I-STAT TROPONIN, ED: TROPONIN I, POC: 0.02 ng/mL (ref 0.00–0.08)

## 2014-02-23 LAB — BASIC METABOLIC PANEL
ANION GAP: 12 (ref 5–15)
BUN: 29 mg/dL — ABNORMAL HIGH (ref 6–23)
CALCIUM: 9.5 mg/dL (ref 8.4–10.5)
CHLORIDE: 105 meq/L (ref 96–112)
CO2: 24 meq/L (ref 19–32)
Creatinine, Ser: 1.39 mg/dL — ABNORMAL HIGH (ref 0.50–1.10)
GFR calc Af Amer: 44 mL/min — ABNORMAL LOW (ref >90)
GFR calc non Af Amer: 38 mL/min — ABNORMAL LOW (ref >90)
Glucose, Bld: 87 mg/dL (ref 70–99)
POTASSIUM: 4.1 meq/L (ref 3.7–5.3)
SODIUM: 141 meq/L (ref 137–147)

## 2014-02-23 MED ORDER — VANCOMYCIN HCL IN DEXTROSE 750-5 MG/150ML-% IV SOLN: 750.0000 mg | Freq: Two times a day (BID) | INTRAVENOUS | Status: DC

## 2014-02-23 MED ORDER — HYDROMORPHONE HCL 1 MG/ML IJ SOLN
1.0000 mg | Freq: Once | INTRAMUSCULAR | Status: AC
  Administered 2014-02-23: 1 mg via INTRAVENOUS
  Filled 2014-02-23: qty 1

## 2014-02-23 MED ORDER — DEXTROSE 5 % IV SOLN: 1.0000 g | Freq: Once | INTRAVENOUS | Status: DC

## 2014-02-23 MED ORDER — IOHEXOL 350 MG/ML SOLN
70.0000 mL | Freq: Once | INTRAVENOUS | Status: AC | PRN
  Administered 2014-02-23: 70 mL via INTRAVENOUS

## 2014-02-23 MED ORDER — SODIUM CHLORIDE 0.9 % IV SOLN
1500.0000 mg | Freq: Once | INTRAVENOUS | Status: DC
  Filled 2014-02-23: qty 1500

## 2014-02-23 NOTE — ED Notes (Signed)
Patient transported to X-ray 

## 2014-02-23 NOTE — ED Notes (Signed)
CT called to inform that patient was having difficulty laying flat for the CT scan due to back pain from her recent surgery.  Called Dr Effie ShyWentz who ordered 1 mg dilaudid, gave this to the patient who seems to be tolerating procedure better now.  Updated family on delay because patient had been gone for procedure longer than expected.

## 2014-02-23 NOTE — ED Notes (Signed)
PER EMS: pt from home, reports non-radiating substernal 6/10 chest pain that started about 12 hours ago while she was sleeping. Pepto bismol did not help. EMS gave 324 asa and 2 SL nitro with relief, pain now 4/10. 88-90% RA and placed on 2L [place]. A&Ox4. Denies N/V/D/SOB. Reports lumbar back pain but had lumbar diskectomy with removal of synovial cyst on Friday. IV 20LAC.

## 2014-02-23 NOTE — Discharge Instructions (Signed)
Your surgical wound, looks good. The evaluation for severe causes of chest pain was negative. Your pain may be related to esophageal reflux. Try taking an antacid, before meals and at bedtime. Make sure you have your primary care doctor followup the, pulmonary nodules, with a CT scan in 6 months.     Chest Pain (Nonspecific) It is often hard to give a specific diagnosis for the cause of chest pain. There is always a chance that your pain could be related to something serious, such as a heart attack or a blood clot in the lungs. You need to follow up with your health care provider for further evaluation. CAUSES   Heartburn.  Pneumonia or bronchitis.  Anxiety or stress.  Inflammation around your heart (pericarditis) or lung (pleuritis or pleurisy).  A blood clot in the lung.  A collapsed lung (pneumothorax). It can develop suddenly on its own (spontaneous pneumothorax) or from trauma to the chest.  Shingles infection (herpes zoster virus). The chest wall is composed of bones, muscles, and cartilage. Any of these can be the source of the pain.  The bones can be bruised by injury.  The muscles or cartilage can be strained by coughing or overwork.  The cartilage can be affected by inflammation and become sore (costochondritis). DIAGNOSIS  Lab tests or other studies may be needed to find the cause of your pain. Your health care provider may have you take a test called an ambulatory electrocardiogram (ECG). An ECG records your heartbeat patterns over a 24-hour period. You may also have other tests, such as:  Transthoracic echocardiogram (TTE). During echocardiography, sound waves are used to evaluate how blood flows through your heart.  Transesophageal echocardiogram (TEE).  Cardiac monitoring. This allows your health care provider to monitor your heart rate and rhythm in real time.  Holter monitor. This is a portable device that records your heartbeat and can help diagnose heart  arrhythmias. It allows your health care provider to track your heart activity for several days, if needed.  Stress tests by exercise or by giving medicine that makes the heart beat faster. TREATMENT   Treatment depends on what may be causing your chest pain. Treatment may include:  Acid blockers for heartburn.  Anti-inflammatory medicine.  Pain medicine for inflammatory conditions.  Antibiotics if an infection is present.  You may be advised to change lifestyle habits. This includes stopping smoking and avoiding alcohol, caffeine, and chocolate.  You may be advised to keep your head raised (elevated) when sleeping. This reduces the chance of acid going backward from your stomach into your esophagus. Most of the time, nonspecific chest pain will improve within 2-3 days with rest and mild pain medicine.  HOME CARE INSTRUCTIONS   If antibiotics were prescribed, take them as directed. Finish them even if you start to feel better.  For the next few days, avoid physical activities that bring on chest pain. Continue physical activities as directed.  Do not use any tobacco products, including cigarettes, chewing tobacco, or electronic cigarettes.  Avoid drinking alcohol.  Only take medicine as directed by your health care provider.  Follow your health care provider's suggestions for further testing if your chest pain does not go away.  Keep any follow-up appointments you made. If you do not go to an appointment, you could develop lasting (chronic) problems with pain. If there is any problem keeping an appointment, call to reschedule. SEEK MEDICAL CARE IF:   Your chest pain does not go away, even after  treatment.  You have a rash with blisters on your chest.  You have a fever. SEEK IMMEDIATE MEDICAL CARE IF:   You have increased chest pain or pain that spreads to your arm, neck, jaw, back, or abdomen.  You have shortness of breath.  You have an increasing cough, or you cough up  blood.  You have severe back or abdominal pain.  You feel nauseous or vomit.  You have severe weakness.  You faint.  You have chills. This is an emergency. Do not wait to see if the pain will go away. Get medical help at once. Call your local emergency services (911 in [place] Do not drive yourself to the hospital. MAKE SURE YOU:   Understand these instructions.  Will watch your condition.  Will get help right away if you are not doing well or get worse. Document Released: 02/16/2005 Document Revised: 05/14/2013 Document Reviewed: 12/13/2007 [facility] Patient Information 2015 [place], [place]. This information is not intended to replace advice given to you by your health care provider. Make sure you discuss any questions you have with your health care provider.

## 2014-02-23 NOTE — ED Notes (Signed)
Patient transported to CT 

## 2014-02-23 NOTE — ED Provider Notes (Signed)
CSN: [id number]     Arrival date & time 02/23/14  0645 History   First MD Initiated Contact with Patient 02/23/14 [phone]     Chief Complaint  Patient presents with  . Chest Pain     (Consider location/radiation/quality/duration/timing/severity/associated sxs/prior Treatment) HPI  [name] is a 68 y.o. female presents for evaluation of chest discomfort, intermittent since last night. The discomfort feels like "a knot." It started at 7 PM last night, and he improved after she took Pepto-Bismol. She is able to sleep all night, but the discomfort awoke her at 5 AM. She again took Pepto-Bismol, without relief. She was evaluated by EMS in her home, for the discomfort, and they treated her with nitroglycerin and aspirin. At the worst. The pain was 8/10. Prior to EMS, giving nitroglycerin. The pain was 6/10, then improved to 4/10 after the nitroglycerin. On evaluation at 0750 hours, she has no chest discomfort. There's been no associated fever, diaphoresis, nausea, vomiting, weakness, or dizziness. She has chronic reflux symptoms, for which she takes omeprazole. She had lumbar surgery 3 days ago, for a "cyst". She is taking her usual medications. There are no other known modifying factors.  Past Medical History  Diagnosis Date  . Polio     age 68 years old  . Hypertension   . Mitral valve prolapse   . Pneumonia     years ago  . Bronchitis due to tobacco use     quit smoking 3 months ago  . Depression   . Anxiety   . GERD (gastroesophageal reflux disease)    Past Surgical History  Procedure Laterality Date  . Abdominal hysterectomy    . Polio Bilateral 1950's    surgery on feet and legs for polio  . Tonsillectomy    . Appendectomy    . Colonoscopy     No family history on file. History  Substance Use Topics  . Smoking status: Former Smoker -- 1.50 packs/day for 50 years    Types: Cigarettes    Quit date: 10/21/2013  . Smokeless tobacco: Never Used  . Alcohol Use: No   OB  History   Grav Para Term Preterm Abortions TAB SAB Ect Mult Living                 Review of Systems  All other systems reviewed and are negative.     Allergies  Review of patient's allergies indicates no known allergies.  Home Medications   Prior to Admission medications   Medication Sig Start Date End Date Taking? Authorizing Provider  aspirin EC 81 MG tablet Take 81 mg by mouth daily.   Yes Historical Provider, MD  BIOTIN PO Take 1 tablet by mouth daily.    Yes Historical Provider, MD  diazepam (VALIUM) 5 MG tablet Take 1 tablet (5 mg total) by mouth every 12 (twelve) hours as needed for anxiety. 02/22/14  Yes Jacqualine CodeBrian Petrarca, PA-C  DULoxetine (CYMBALTA) 60 MG capsule Take 60 mg by mouth daily. 12/25/13  Yes Historical Provider, MD  losartan-hydrochlorothiazide (HYZAAR) 100-25 MG per tablet Take 1 tablet by mouth daily.   Yes Historical Provider, MD  methocarbamol (ROBAXIN) 500 MG tablet Take 1 tablet (500 mg total) by mouth every 6 (six) hours as needed for muscle spasms (spasm). 02/21/14  Yes Wende NeighborsSheila M Vernon, PA-C  omeprazole (PRILOSEC) 20 MG capsule Take 20 mg by mouth daily.   Yes Historical Provider, MD  Red Yeast Rice Extract (GNP RED YEAST RICE PO) Take 1  tablet by mouth daily.    Yes Historical Provider, MD  zolpidem (AMBIEN) 10 MG tablet Take 10 mg by mouth at bedtime as needed for sleep.   Yes Historical Provider, MD   BP 177/61  Pulse 71  Temp(Src) 97.7 F (36.5 C) (Oral)  Resp 19  SpO2 94% Physical Exam  Nursing note and vitals reviewed. Constitutional: She is oriented to person, place, and time. She appears well-developed and well-nourished.  HENT:  Head: Normocephalic and atraumatic.  Right Ear: External ear normal.  Left Ear: External ear normal.  Eyes: Conjunctivae and EOM are normal. Pupils are equal, round, and reactive to light.  Neck: Normal range of motion and phonation normal. Neck supple.  Cardiovascular: Normal rate, regular rhythm and normal heart  sounds.   Pulmonary/Chest: Effort normal and breath sounds normal. She exhibits no bony tenderness.  Abdominal: Soft. There is no tenderness.  Musculoskeletal: Normal range of motion.  Mild lumbar tenderness. Surgical wound appears normal.  Neurological: She is alert and oriented to person, place, and time. No cranial nerve deficit or sensory deficit. She exhibits normal muscle tone. Coordination normal.  Skin: Skin is warm, dry and intact.  Psychiatric: She has a normal mood and affect. Her behavior is normal. Judgment and thought content normal.    ED Course  Procedures (including critical care time)  Medications  iohexol (OMNIPAQUE) 350 MG/ML injection 70 mL (70 mLs Intravenous Contrast Given 02/23/14 1012)  HYDROmorphone (DILAUDID) injection 1 mg (1 mg Intravenous Given 02/23/14 1036)    Patient Vitals for the past 24 hrs:  BP Temp Temp src Pulse Resp SpO2  02/23/14 1110 - - - 71 19 94 %  02/23/14 1054 177/61 mmHg - - 74 21 92 %  02/23/14 0930 132/74 mmHg - - 62 19 96 %  02/23/14 0915 144/67 mmHg - - 69 13 96 %  02/23/14 0900 128/61 mmHg - - 69 17 91 %  02/23/14 0845 135/62 mmHg - - 71 24 94 %  02/23/14 0715 133/71 mmHg - - 66 19 99 %  02/23/14 0704 150/47 mmHg 97.7 F (36.5 C) Oral 64 18 95 %    12:03 PM Reevaluation with update and discussion. After initial assessment and treatment, an updated evaluation reveals she is comfortable. Now. Findings discussed with patient and family members, all questions answered. Hamlin Devine L    Medications  iohexol (OMNIPAQUE) 350 MG/ML injection 70 mL (70 mLs Intravenous Contrast Given 02/23/14 1012)  HYDROmorphone (DILAUDID) injection 1 mg (1 mg Intravenous Given 02/23/14 1036)    Patient Vitals for the past 24 hrs:  BP Temp Temp src Pulse Resp SpO2  02/23/14 1110 - - - 71 19 94 %  02/23/14 1054 177/61 mmHg - - 74 21 92 %  02/23/14 0930 132/74 mmHg - - 62 19 96 %  02/23/14 0915 144/67 mmHg - - 69 13 96 %  02/23/14 0900 128/61 mmHg -  - 69 17 91 %  02/23/14 0845 135/62 mmHg - - 71 24 94 %  02/23/14 0715 133/71 mmHg - - 66 19 99 %  02/23/14 0704 150/47 mmHg 97.7 F (36.5 C) Oral 64 18 95 %    11:56 AM Reevaluation with update and discussion. After initial assessment and treatment, an updated evaluation reveals . She is comfortable now, and has no further complaints. Findings discussed with patient's family, all questions answered. Shera Laubach L    Labs Review Labs Reviewed  CBC - Abnormal; Notable for the following:    Hemoglobin  11.4 (*)    HCT 34.0 (*)    All other components within normal limits  BASIC METABOLIC PANEL - Abnormal; Notable for the following:    BUN 29 (*)    Creatinine, Ser 1.39 (*)    GFR calc non Af Amer 38 (*)    GFR calc Af Amer 44 (*)    All other components within normal limits  I-STAT TROPOININ, ED    Imaging Review Dg Chest 2 View  02/23/2014   CLINICAL DATA:  One day history of chest pain ; 2 days status post lumbar spine surgery; history of hypertension, tobacco use, and polio  EXAM: CHEST  2 VIEW  COMPARISON:  PA and lateral chest x-ray of February 20, 2014  FINDINGS: The lungs are adequately inflated. There are increased densities noted in the right perihilar and retrosternal regions consistent with atelectasis or developing pneumonia. On the lateral film slightly increased density projects over the lower thoracic spine in the right lower lobe. The heart and pulmonary vascularity are normal. The mediastinum is normal in width. There is tortuosity of the descending thoracic aorta. The bony thorax is unremarkable.  IMPRESSION: New right middle and lower lobe atelectasis or early pneumonia. This may be related to aspiration. There is no evidence of CHF.   Electronically Signed   By: David  [place]   On: 02/23/2014 07:50   Ct Angio Chest Pe W/cm &/or Wo Cm  02/23/2014   CLINICAL DATA:  Back surgery last Friday, now with chest pain. Evaluate for pulmonary embolism.  EXAM: CT ANGIOGRAPHY CHEST  WITH CONTRAST  TECHNIQUE: Multidetector CT imaging of the chest was performed using the standard protocol during bolus administration of intravenous contrast. Multiplanar CT image reconstructions and MIPs were obtained to evaluate the vascular anatomy.  CONTRAST:  70 mL OMNIPAQUE IOHEXOL 350 MG/ML SOLN - an additional 50 cc of Omnipaque 350 was administered secondary to injector malfunction with the initial contrast bolus.  COMPARISON:  Chest radiograph - 02/23/2014; 02/20/2014  FINDINGS: Vascular Findings:  There is adequate opacification of the pulmonary arterial system with the main pulmonary artery measuring 335 Hounsfield units. No discrete filling defects are seen within the pulmonary arterial tree to suggest pulmonary embolism. Normal caliber the main pulmonary artery.  Normal heart size. No pericardial effusion. Scattered minimal mixed calcified and noncalcified atherosclerotic plaque within a normal caliber thoracic aorta.  Incidental note is made of an aberrant right subclavian artery, coursing posterior to the trachea and esophagus. The branch vessels of the aortic arch appear widely patent throughout their imaged course. No definite perivascular stranding.  Review of the MIP images confirms the above findings.   ----------------------------------------------------------------------------------  Nonvascular Findings:  Advanced emphysematous change, primarily affecting the bilateral upper lobes. Grossly symmetric biapical pleural parenchymal thickening. Minimal grossly symmetric dependent ground-glass atelectasis. There is minimal pleural parenchymal thickening about the posterior aspect of the right major fissure. No discrete focal airspace opacities. No pleural effusion or pneumothorax. The central pulmonary airways are widely patent.  There is a calcified approximately 1.0 x 0.8 cm granuloma within the right upper lobe (image 38, series 11).  There are scattered bilateral punctate (4-5 mm) noncalcified  nodules within the subpleural aspect of the left upper lobe (image 32, series 11), the superior segment of the lingula (image 42) and within the right upper lobe (image 25).  Scattered shotty mediastinal and hilar lymph nodes are not enlarged by size criteria with index left hilar nodal conglomeration measuring 0.8 cm in greatest short axis  diameter (image 38, series 9). No axillary lymphadenopathy.  Early arterial phase evaluation of the upper abdomen is normal.  No acute or aggressive osseus abnormalities. Mild multilevel DDD within the lower thoracic spine. Normal appearance of the thyroid gland.  IMPRESSION: 1. No acute cardiopulmonary disease. Specifically, no evidence of pulmonary embolism. 2. Incidentally noted aberrant right subclavian artery. 3. Advanced emphysematous change. 4. Indeterminate bilateral pulmonary nodules, largest of which within the right upper lobe measures 5 mm in diameter. Given risk factors for bronchogenic carcinoma, follow-up chest CT at 6 - 12 months is recommended. This recommendation follows the consensus statement: Guidelines for Management of Small Pulmonary Nodules Detected on CT Scans: A Statement from the Fleischner Society as published in Radiology 2005;237:395-400.   Electronically Signed   By: Simonne Come M.D.   On: 02/23/2014 11:13     EKG Interpretation   Date/Time:  Sunday February 23 2014 07:06:22 EDT Ventricular Rate:  60 PR Interval:  127 QRS Duration: 77 QT Interval:  433 QTC Calculation: 433 R Axis:   21 Text Interpretation:  Sinus rhythm Since last tracing Nonspecific T wave  abnormality has resolved Confirmed by Effie Shy  MD, Josaphine Shimamoto ([id number]) on  02/23/2014 9:00:47 AM      MDM   Final diagnoses:  Chest pain, unspecified chest pain type  Encounter for wound re-check  Lung nodules    Nonspecific chest pain, with negative evaluation in the emergency for PE, pneumonia, metabolic instability, or serious bacterial infection. She is not having fever.  There is no concern for postoperative infection, or complication.  Nursing Notes Reviewed/ Care Coordinated Applicable Imaging Reviewed Interpretation of Laboratory Data incorporated into ED treatment  The patient appears reasonably screened and/or stabilized for discharge and I doubt any other medical condition or other [facility] requiring further screening, evaluation, or treatment in the ED at this time prior to discharge.  Plan: Home Medications- usual; Home Treatments- rest; return here if the recommended treatment, does not improve the symptoms; Recommended follow up- PCP prn, surgery as scheduled  Flint Melter, MD 02/23/14 1203

## 2014-02-23 NOTE — Progress Notes (Signed)
ANTIBIOTIC CONSULT NOTE - INITIAL  Pharmacy Consult for vancomycin Indication: pneumonia  No Known Allergies  Patient Measurements:   Body Weight: 78 kg  Vital Signs: Temp: 97.7 F (36.5 C) (10/04 0704) Temp Source: Oral (10/04 0704) BP: 150/47 mmHg (10/04 0704) Pulse Rate: 64 (10/04 0704) Intake/Output from previous day:   Intake/Output from this shift:    Labs:  Recent Labs  02/20/14 1352  WBC 5.9  HGB 12.8  PLT 304  CREATININE 1.31*   The CrCl is unknown because both a height and weight (above a minimum accepted value) are required for this calculation. No results found for this basename: VANCOTROUGH, Leodis BinetVANCOPEAK, VANCORANDOM, GENTTROUGH, GENTPEAK, GENTRANDOM, TOBRATROUGH, TOBRAPEAK, TOBRARND, AMIKACINPEAK, AMIKACINTROU, AMIKACIN,  in the last 72 hours   Microbiology: Recent Results (from the past 720 hour(s))  SURGICAL PCR SCREEN     Status: None   Collection Time    02/20/14  1:52 PM      Result Value Ref Range Status   MRSA, PCR NEGATIVE  NEGATIVE Final   Staphylococcus aureus NEGATIVE  NEGATIVE Final   Comment:            The Xpert SA Assay (FDA     approved for NASAL specimens     in patients over 69 years of age),     is one component of     a comprehensive surveillance     program.  Test performance has     been validated by [facility] for patients greater     than or equal to 69 year old.     It is not intended     to diagnose infection nor to     guide or monitor treatment.    Medical History: Past Medical History  Diagnosis Date  . Polio     age 68 years old  . Hypertension   . Mitral valve prolapse   . Pneumonia     years ago  . Bronchitis due to tobacco use     quit smoking 3 months ago  . Depression   . Anxiety   . GERD (gastroesophageal reflux disease)     Medications:  See medication history  Assessment: 68 yo lady with recent surgery to start broad spectrum antibiotics for PNA.  Goal of Therapy:  Vancomycin trough  level 15-20 mcg/ml  Plan:  Vancomycin 1500 mg IV X 1 then 750 mg IV q12 hours. F/u renal function, cultures and clinical course  Thanks for allowing pharmacy to be a part of this patient's care.  Talbert CageLora Vicent Febles, PharmD Clinical Pharmacist, [phone] 02/23/2014,7:25 AM

## 2014-02-24 ENCOUNTER — Encounter (HOSPITAL_COMMUNITY): Payer: Self-pay | Admitting: Orthopaedic Surgery

## 2014-03-12 NOTE — Discharge Summary (Signed)
Physician Discharge Summary  Patient ID: [name] MRN: [id number] DOB/AGE: 69/05/1945 69 y.o.  Admit date: 02/21/2014 Discharge date: 02/22/2014  Admission Diagnoses:  HNP (herniated nucleus pulposus), lumbar Left L5-S1  Discharge Diagnoses:  Principal Problem:   HNP (herniated nucleus pulposus), lumbar Active Problems:   Synovial cyst of lumbar spine   HNP (herniated nucleus pulposus)   Past Medical History  Diagnosis Date  . Polio     age 68 years old  . Hypertension   . Mitral valve prolapse   . Pneumonia     years ago  . Bronchitis due to tobacco use     quit smoking 3 months ago  . Depression   . Anxiety   . GERD (gastroesophageal reflux disease)     Surgeries: Procedure(s): Left L5-S1 Hemilaminectomy, Lateral Recess Decompression, Removal Synovial Cyst on 02/21/2014   Consultants (if any):    Discharged Condition: Improved  Hospital Course: [name] is an 68 y.o. female who was admitted 02/21/2014 with a diagnosis of HNP (herniated nucleus pulposus), lumbar and went to the operating room on 02/21/2014 and underwent the above named procedures.    She was given perioperative antibiotics:  Anti-infectives   Start     Dose/Rate Route Frequency Ordered Stop   02/21/14 1800  ceFAZolin (ANCEF) IVPB 1 g/50 mL premix     1 g 100 mL/hr over 30 Minutes Intravenous Every 8 hours 02/21/14 1312 02/22/14 0203   02/21/14 0600  ceFAZolin (ANCEF) IVPB 2 g/50 mL premix     2 g 100 mL/hr over 30 Minutes Intravenous On call to O.R. 02/20/14 1408 02/21/14 1019    .  She was given sequential compression devices, early ambulation for DVT prophylaxis.  She benefited maximally from the hospital stay and there were no complications.    Recent vital signs:  Filed Vitals:   02/22/14 0539  BP: 125/62  Pulse: 72  Temp: 98.6 F (37 C)  Resp: 18    Recent laboratory studies:  Lab Results  Component Value Date   HGB 11.4* 02/23/2014   HGB 12.8 02/20/2014   HGB 11.9*  02/18/2011   Lab Results  Component Value Date   WBC 7.0 02/23/2014   PLT 280 02/23/2014   No results found for this basename: INR   Lab Results  Component Value Date   NA 141 02/23/2014   K 4.1 02/23/2014   CL 105 02/23/2014   CO2 24 02/23/2014   BUN 29* 02/23/2014   CREATININE 1.39* 02/23/2014   GLUCOSE 87 02/23/2014    Discharge Medications:     Medication List         aspirin EC 81 MG tablet  Take 81 mg by mouth daily.     BIOTIN PO  Take 1 tablet by mouth daily.     diazepam 5 MG tablet  Commonly known as:  VALIUM  Take 1 tablet (5 mg total) by mouth every 12 (twelve) hours as needed for anxiety.     GNP RED YEAST RICE PO  Take 1 tablet by mouth daily.     losartan-hydrochlorothiazide 100-25 MG per tablet  Commonly known as:  HYZAAR  Take 1 tablet by mouth daily.     methocarbamol 500 MG tablet  Commonly known as:  ROBAXIN  Take 1 tablet (500 mg total) by mouth every 6 (six) hours as needed for muscle spasms (spasm).     omeprazole 20 MG capsule  Commonly known as:  PRILOSEC  Take 20 mg  by mouth daily.     zolpidem 10 MG tablet  Commonly known as:  AMBIEN  Take 10 mg by mouth at bedtime as needed for sleep.        Diagnostic Studies: Dg Chest 2 View  02/23/2014   CLINICAL DATA:  One day history of chest pain ; 2 days status post lumbar spine surgery; history of hypertension, tobacco use, and polio  EXAM: CHEST  2 VIEW  COMPARISON:  PA and lateral chest x-ray of February 20, 2014  FINDINGS: The lungs are adequately inflated. There are increased densities noted in the right perihilar and retrosternal regions consistent with atelectasis or developing pneumonia. On the lateral film slightly increased density projects over the lower thoracic spine in the right lower lobe. The heart and pulmonary vascularity are normal. The mediastinum is normal in width. There is tortuosity of the descending thoracic aorta. The bony thorax is unremarkable.  IMPRESSION: New right middle  and lower lobe atelectasis or early pneumonia. This may be related to aspiration. There is no evidence of CHF.   Electronically Signed   By: David  Swazil[place]   On: 02/23/2014 07:50   Dg Chest 2 View  02/20/2014   CLINICAL DATA:  Preop L5-S1 foraminal stenosis, intraspinal extradural cyst.  EXAM: CHEST  2 VIEW  COMPARISON:  04/29/2013 and 02/18/2011  FINDINGS: Lungs are adequately inflated without consolidation or effusion. Stable nodular opacity lateral to the right hilum. Cardiomediastinal silhouette and remainder of the exam is unchanged.  IMPRESSION: No active cardiopulmonary disease.   Electronically Signed   By: Elberta Fortisaniel  Boyle M.D.   On: 02/20/2014 15:37   Ct Angio Chest Pe W/cm &/or Wo Cm  02/23/2014   CLINICAL DATA:  Back surgery last Friday, now with chest pain. Evaluate for pulmonary embolism.  EXAM: CT ANGIOGRAPHY CHEST WITH CONTRAST  TECHNIQUE: Multidetector CT imaging of the chest was performed using the standard protocol during bolus administration of intravenous contrast. Multiplanar CT image reconstructions and MIPs were obtained to evaluate the vascular anatomy.  CONTRAST:  70 mL OMNIPAQUE IOHEXOL 350 MG/ML SOLN - an additional 50 cc of Omnipaque 350 was administered secondary to injector malfunction with the initial contrast bolus.  COMPARISON:  Chest radiograph - 02/23/2014; 02/20/2014  FINDINGS: Vascular Findings:  There is adequate opacification of the pulmonary arterial system with the main pulmonary artery measuring 335 Hounsfield units. No discrete filling defects are seen within the pulmonary arterial tree to suggest pulmonary embolism. Normal caliber the main pulmonary artery.  Normal heart size. No pericardial effusion. Scattered minimal mixed calcified and noncalcified atherosclerotic plaque within a normal caliber thoracic aorta.  Incidental note is made of an aberrant right subclavian artery, coursing posterior to the trachea and esophagus. The branch vessels of the aortic arch appear  widely patent throughout their imaged course. No definite perivascular stranding.  Review of the MIP images confirms the above findings.   ----------------------------------------------------------------------------------  Nonvascular Findings:  Advanced emphysematous change, primarily affecting the bilateral upper lobes. Grossly symmetric biapical pleural parenchymal thickening. Minimal grossly symmetric dependent ground-glass atelectasis. There is minimal pleural parenchymal thickening about the posterior aspect of the right major fissure. No discrete focal airspace opacities. No pleural effusion or pneumothorax. The central pulmonary airways are widely patent.  There is a calcified approximately 1.0 x 0.8 cm granuloma within the right upper lobe (image 38, series 11).  There are scattered bilateral punctate (4-5 mm) noncalcified nodules within the subpleural aspect of the left upper lobe (image 32, series 11), the  superior segment of the lingula (image 42) and within the right upper lobe (image 25).  Scattered shotty mediastinal and hilar lymph nodes are not enlarged by size criteria with index left hilar nodal conglomeration measuring 0.8 cm in greatest short axis diameter (image 38, series 9). No axillary lymphadenopathy.  Early arterial phase evaluation of the upper abdomen is normal.  No acute or aggressive osseus abnormalities. Mild multilevel DDD within the lower thoracic spine. Normal appearance of the thyroid gland.  IMPRESSION: 1. No acute cardiopulmonary disease. Specifically, no evidence of pulmonary embolism. 2. Incidentally noted aberrant right subclavian artery. 3. Advanced emphysematous change. 4. Indeterminate bilateral pulmonary nodules, largest of which within the right upper lobe measures 5 mm in diameter. Given risk factors for bronchogenic carcinoma, follow-up chest CT at 6 - 12 months is recommended. This recommendation follows the consensus statement: Guidelines for Management of Small  Pulmonary Nodules Detected on CT Scans: A Statement from the Fleischner Society as published in Radiology 2005;237:395-400.   Electronically Signed   By: Simonne Come M.D.   On: 02/23/2014 11:13   Dg Lumbar Spine 1 View  02/21/2014   CLINICAL DATA:  Intraoperative localization for microdiscectomy  EXAM: LUMBAR SPINE - 1 VIEW  COMPARISON:  01/10/2014  FINDINGS: Five lumbar type vertebral bodies are again well visualized. Vertebral body height is well maintained. A needle is noted in the posterior soft tissues at the L5-S1 level. Disc space narrowing is noted at L4-5 and L5-S1.  IMPRESSION: Intraoperative localization at L5-S1.   Electronically Signed   By: Alcide Clever M.D.   On: 02/21/2014 11:50    Disposition: 01-Home or Self Care  DISCHARGE INSTRUCTIONS:  No lifting greater than 10 lbs. Avoid bending, stooping and twisting. Walk in house for first week them may start to get out slowly increasing distance up to one mile by 3 weeks post op. Keep incision dry for 3 days, may use tegaderm or similar water impervious dressing. Change dressing daily or as needed. Ice packs daily or as needed for pain and swelling.      Follow-up Information   Follow up with Eldred Manges, MD. Schedule an appointment as soon as possible for a visit in 2 weeks.   Specialty:  Orthopedic Surgery   Contact information:   [address] [place] [address] [phone]        Signed: Wende Neighbors 03/12/2014, 3:26 PM

## 2014-04-11 ENCOUNTER — Other Ambulatory Visit: Payer: Self-pay | Admitting: Orthopaedic Surgery

## 2014-04-11 DIAGNOSIS — M545 Low back pain: Secondary | ICD-10-CM

## 2014-04-24 ENCOUNTER — Ambulatory Visit
Admission: RE | Admit: 2014-04-24 | Discharge: 2014-04-24 | Disposition: A | Discharge status: Home / Self Care | Payer: Medicare PPO | Source: Ambulatory Visit | Attending: Orthopaedic Surgery | Admitting: Orthopaedic Surgery

## 2014-04-24 DIAGNOSIS — M545 Low back pain: Secondary | ICD-10-CM

## 2014-04-24 MED ORDER — GADOBENATE DIMEGLUMINE 529 MG/ML IV SOLN: 8.0000 mL | Freq: Once | INTRAVENOUS | Status: AC | PRN

## 2014-06-04 ENCOUNTER — Ambulatory Visit: Payer: Commercial Managed Care - HMO | Admitting: Podiatry

## 2014-08-07 ENCOUNTER — Other Ambulatory Visit: Payer: Self-pay | Admitting: Family Medicine

## 2014-10-15 ENCOUNTER — Other Ambulatory Visit: Payer: Self-pay | Admitting: Orthopaedic Surgery

## 2014-10-15 DIAGNOSIS — R0989 Other specified symptoms and signs involving the circulatory and respiratory systems: Secondary | ICD-10-CM

## 2014-10-31 ENCOUNTER — Other Ambulatory Visit: Payer: Medicare Other

## 2014-11-04 ENCOUNTER — Ambulatory Visit
Admission: RE | Admit: 2014-11-04 | Discharge: 2014-11-04 | Disposition: A | Discharge status: Home / Self Care | Payer: Medicare PPO | Source: Ambulatory Visit | Attending: Orthopaedic Surgery | Admitting: Orthopaedic Surgery

## 2014-11-04 DIAGNOSIS — R0989 Other specified symptoms and signs involving the circulatory and respiratory systems: Secondary | ICD-10-CM

## 2014-12-03 ENCOUNTER — Other Ambulatory Visit: Payer: Self-pay | Admitting: Orthopaedic Surgery

## 2014-12-03 DIAGNOSIS — M545 Low back pain: Secondary | ICD-10-CM

## 2014-12-16 ENCOUNTER — Ambulatory Visit
Admission: RE | Admit: 2014-12-16 | Discharge: 2014-12-16 | Disposition: A | Discharge status: Home / Self Care | Payer: Medicare PPO | Source: Ambulatory Visit | Attending: Orthopaedic Surgery | Admitting: Orthopaedic Surgery

## 2014-12-16 DIAGNOSIS — M545 Low back pain: Secondary | ICD-10-CM

## 2014-12-16 MED ORDER — GADOBENATE DIMEGLUMINE 529 MG/ML IV SOLN
16.0000 mL | Freq: Once | INTRAVENOUS | Status: AC | PRN
  Administered 2014-12-16: 16 mL via INTRAVENOUS

## 2015-02-11 ENCOUNTER — Ambulatory Visit: Payer: Commercial Managed Care - HMO | Admitting: Podiatry

## 2015-03-24 ENCOUNTER — Ambulatory Visit: Payer: Medicare PPO | Admitting: Neurology

## 2015-03-26 ENCOUNTER — Ambulatory Visit: Payer: Medicare PPO | Admitting: Neurology

## 2015-04-01 ENCOUNTER — Encounter: Payer: Self-pay | Admitting: Neurology

## 2015-04-01 ENCOUNTER — Ambulatory Visit (INDEPENDENT_AMBULATORY_CARE_PROVIDER_SITE_OTHER): Payer: Medicare PPO | Admitting: Neurology

## 2015-04-01 VITALS — BP 128/81 | HR 76 | Ht 62.0 in | Wt 184.0 lb

## 2015-04-01 DIAGNOSIS — M5432 Sciatica, left side: Secondary | ICD-10-CM

## 2015-04-01 HISTORY — DX: Sciatica, left side: M54.32

## 2015-04-01 MED ORDER — GABAPENTIN 300 MG PO CAPS: ORAL_CAPSULE | ORAL | Status: DC

## 2015-04-01 NOTE — Progress Notes (Signed)
Reason for visit: Back and left leg pain  Referring physician: Dr. Devoria Glassing is a 69 y.o. female  History of present illness:  [name] is a 69 year old left-handed black female with a history of polio that has affected the right foot primarily. The patient indicates that beginning around May 2015 she began having some discomfort in the back, with pain down into the left knee and into the left foot, primarily affecting the great toe. The patient was found to have a synovial cyst affecting the L5 nerve root, and she underwent surgery in October 2015. Unfortunately, the pain if anything worsened after surgery. The patient has had 2 MRI evaluations of the low back since surgery, the last one was done in July 2016. The MRI report questions some scarring around the S1 nerve root, but there is some evidence of possible compression of the L5 nerve root on the left as well. The patient has been given Tylenol # 3 with modest benefit with the pain. She denies any definite weakness of the left leg, but she feels weak in both legs at times, as the knees may collapse. The patient has fallen on occasion. She denies issues controlling the bowels or the bladder. She does have some tingling sensations into the left knee and into the great toe on the left foot. She has chronic problems with insomnia. She indicates that her leg pain will worsen when she is up on her feet, she feels better when she is sitting or lying down. She comes to this office for an evaluation.  Past Medical History  Diagnosis Date  . Polio     age 13 years old  . Hypertension   . Mitral valve prolapse   . Pneumonia     years ago  . Bronchitis due to tobacco use (HCC)     quit smoking 3 months ago  . Depression   . Anxiety   . GERD (gastroesophageal reflux disease)   . Chronic sciatica of left side 04/01/2015    Past Surgical History  Procedure Laterality Date  . Abdominal hysterectomy    . Polio Bilateral 1950's   surgery on feet and legs for polio  . Tonsillectomy    . Appendectomy    . Colonoscopy    . Lumbar laminectomy Left 02/21/2014    Procedure: Left L5-S1 Hemilaminectomy, Lateral Recess Decompression, Removal Synovial Cyst;  Surgeon: Eldred Manges, MD;  Location: [facility] OR;  Service: Orthopedics;  Laterality: Left;    Family History  Problem Relation Age of Onset  . Cancer Mother     thyroid  . Heart attack Father   . Cancer Sister     breast  . Kidney disease Sister     ESRD  . Cancer Sister     thyroid    Social history:  reports that she quit smoking about 17 months ago. Her smoking use included Cigarettes. She has a 75 pack-year smoking history. She has never used smokeless tobacco. She reports that she drinks alcohol. She reports that she does not use illicit drugs.  Medications:  Prior to Admission medications   Medication Sig Start Date End Date Taking? Authorizing Provider  aspirin EC 81 MG tablet Take 81 mg by mouth daily.   Yes Historical Provider, MD  BIOTIN PO Take 1 tablet by mouth daily.    Yes Historical Provider, MD  diazepam (VALIUM) 5 MG tablet Take 1 tablet (5 mg total) by mouth every 12 (twelve)  hours as needed for anxiety. 02/22/14  Yes Oris Drone Petrarca, PA-C  DULoxetine (CYMBALTA) 60 MG capsule Take 60 mg by mouth daily. 12/25/13  Yes Historical Provider, MD  losartan-hydrochlorothiazide (HYZAAR) 100-25 MG per tablet Take 1 tablet by mouth daily.   Yes Historical Provider, MD  omeprazole (PRILOSEC) 20 MG capsule Take 20 mg by mouth daily.   Yes Historical Provider, MD  zolpidem (AMBIEN) 10 MG tablet Take 10 mg by mouth at bedtime as needed for sleep.   Yes Historical Provider, MD     No Known Allergies  ROS:  Out of a complete 14 system review of symptoms, the patient complains only of the following symptoms, and all other reviewed systems are negative.  Weight gain, fatigue Swelling in the legs Difficulty swallowing Shortness of breath,  wheezing Constipation Feeling hot Joint pain, muscle cramps, aching muscles Weakness Anxiety, depression, sleeping during the day, not enough sleep at night, decreased appetite, racing thoughts Restless legs  Blood pressure 128/81, pulse 76, height  (1.575 m), weight 184 lb (83.462 kg).  Physical Exam  General: The patient is alert and cooperative at the time of the examination. The patient is moderately obese.  Eyes: Pupils are equal, round, and reactive to light. Discs are flat bilaterally.  Neck: The neck is supple, no carotid bruits are noted.  Respiratory: The respiratory examination is clear.  Cardiovascular: The cardiovascular examination reveals a regular rate and rhythm, no obvious murmurs or rubs are noted.  Skin: Extremities are without significant edema. The right foot is smaller than the left. Some atrophy of the calf muscles are noted on the right.  Neurologic Exam  Mental status: The patient is alert and oriented x 3 at the time of the examination. The patient has apparent normal recent and remote memory, with an apparently normal attention span and concentration ability.  Cranial nerves: Facial symmetry is present. There is good sensation of the face to pinprick and soft touch bilaterally. The strength of the facial muscles and the muscles to head turning and shoulder shrug are normal bilaterally. Speech is well enunciated, no aphasia or dysarthria is noted. Extraocular movements are full. Visual fields are full. The tongue is midline, and the patient has symmetric elevation of the soft palate. No obvious hearing deficits are noted.  Motor: The motor testing reveals 5 over 5 strength of all 4 extremities, with exception that there is weakness with dorsi and plantar flexion, inversion and eversion of the right foot. Good symmetric motor tone is noted throughout.  Sensory: Sensory testing is intact to pinprick, soft touch, vibration sensation, and position sense on  all 4 extremities, with exception of some decrease in position sense of the right foot. No evidence of extinction is noted.  Coordination: Cerebellar testing reveals good finger-nose-finger and heel-to-shin bilaterally.  Gait and station: Gait is normal. Tandem gait was not tested. Romberg is negative. No drift is seen.  Reflexes: Deep tendon reflexes are symmetric and normal bilaterally in the arms, decreased on the right ankle jerk reflex relative to the left. Toes are downgoing bilaterally.   MRI lumbar 12/16/14:  IMPRESSION: Potentially symptomatic left-sided neural impingement most likely at L5-S1 in the foraminal and extraforaminal compartment affecting the LEFT L5 nerve root.  No recurrent LEFT L5-S1 synovial cyst.  Uncomplicated appearance following LEFT L4-5 and LEFT L5-S1 Discectomies.  * MRI scan images were reviewed online. I agree with the written report.   Assessment/Plan:  1. History of polio, right foot  2. Low  back pain, left leg discomfort  The pain and sensory symptoms of the patient seemed to simulate a L5 nerve root distribution. The patient has some evidence of impingement of the left L5 nerve root by MRI. The patient will be placed on gabapentin, I have recommended nerve conduction study and EMG evaluation, but she does not wish to pursue this at this time. If the gabapentin is not effective, she is to contact our office, otherwise we will follow-up in 4 months. The patient has had epidural steroid injections in the past with some benefit lasting up to 6 weeks.  Marlan Palau. Keith Willis MD 04/01/2015 6:14 PM  [facility] [address] [place], [place] [id number]  Phone [phone] Fax [phone]

## 2015-04-01 NOTE — Patient Instructions (Addendum)
We will try gabapentin for the back and leg pain, if this is not effective, call and we will set up the EMG and NCV study.   Sciatica Sciatica is pain, weakness, numbness, or tingling along the path of the sciatic nerve. The nerve starts in the lower back and runs down the back of each leg. The nerve controls the muscles in the lower leg and in the back of the knee, while also providing sensation to the back of the thigh, lower leg, and the sole of your foot. Sciatica is a symptom of another medical condition. For instance, nerve damage or certain conditions, such as a herniated disk or bone spur on the spine, pinch or put pressure on the sciatic nerve. This causes the pain, weakness, or other sensations normally associated with sciatica. Generally, sciatica only affects one side of the body. CAUSES   Herniated or slipped disc.  Degenerative disk disease.  A pain disorder involving the narrow muscle in the buttocks (piriformis syndrome).  Pelvic injury or fracture.  Pregnancy.  Tumor (rare). SYMPTOMS  Symptoms can vary from mild to very severe. The symptoms usually travel from the low back to the buttocks and down the back of the leg. Symptoms can include:  Mild tingling or dull aches in the lower back, leg, or hip.  Numbness in the back of the calf or sole of the foot.  Burning sensations in the lower back, leg, or hip.  Sharp pains in the lower back, leg, or hip.  Leg weakness.  Severe back pain inhibiting movement. These symptoms may get worse with coughing, sneezing, laughing, or prolonged sitting or standing. Also, being overweight may worsen symptoms. DIAGNOSIS  Your caregiver will perform a physical exam to look for common symptoms of sciatica. He or she may ask you to do certain movements or activities that would trigger sciatic nerve pain. Other tests may be performed to find the cause of the sciatica. These may include:  Blood tests.  X-rays.  Imaging tests, such  as an MRI or CT scan. TREATMENT  Treatment is directed at the cause of the sciatic pain. Sometimes, treatment is not necessary and the pain and discomfort goes away on its own. If treatment is needed, your caregiver may suggest:  Over-the-counter medicines to relieve pain.  Prescription medicines, such as anti-inflammatory medicine, muscle relaxants, or narcotics.  Applying heat or ice to the painful area.  Steroid injections to lessen pain, irritation, and inflammation around the nerve.  Reducing activity during periods of pain.  Exercising and stretching to strengthen your abdomen and improve flexibility of your spine. Your caregiver may suggest losing weight if the extra weight makes the back pain worse.  Physical therapy.  Surgery to eliminate what is pressing or pinching the nerve, such as a bone spur or part of a herniated disk. HOME CARE INSTRUCTIONS   Only take over-the-counter or prescription medicines for pain or discomfort as directed by your caregiver.  Apply ice to the affected area for 20 minutes, 3-4 times a day for the first 48-72 hours. Then try heat in the same way.  Exercise, stretch, or perform your usual activities if these do not aggravate your pain.  Attend physical therapy sessions as directed by your caregiver.  Keep all follow-up appointments as directed by your caregiver.  Do not wear high heels or shoes that do not provide proper support.  Check your mattress to see if it is too soft. A firm mattress may lessen your pain  and discomfort. SEEK IMMEDIATE MEDICAL CARE IF:   You lose control of your bowel or bladder (incontinence).  You have increasing weakness in the lower back, pelvis, buttocks, or legs.  You have redness or swelling of your back.  You have a burning sensation when you urinate.  You have pain that gets worse when you lie down or awakens you at night.  Your pain is worse than you have experienced in the past.  Your pain is  lasting longer than 4 weeks.  You are suddenly losing weight without reason. MAKE SURE YOU:  Understand these instructions.  Will watch your condition.  Will get help right away if you are not doing well or get worse.   This information is not intended to replace advice given to you by your health care provider. Make sure you discuss any questions you have with your health care provider.   Document Released: 05/03/2001 Document Revised: 01/28/2015 Document Reviewed: 09/18/2011 Elsevier Interactive Patient Education [employer]2016 [employer] Inc.

## 2015-04-08 ENCOUNTER — Emergency Department (HOSPITAL_COMMUNITY)
Admission: EM | Admit: 2015-04-08 | Discharge: 2015-04-08 | Disposition: A | Discharge status: Home / Self Care | Payer: Medicare PPO | Attending: Emergency Medicine | Admitting: Emergency Medicine

## 2015-04-08 ENCOUNTER — Encounter (HOSPITAL_COMMUNITY): Payer: Self-pay | Admitting: Emergency Medicine

## 2015-04-08 ENCOUNTER — Emergency Department (HOSPITAL_COMMUNITY): Payer: Medicare PPO

## 2015-04-08 DIAGNOSIS — F329 Major depressive disorder, single episode, unspecified: Secondary | ICD-10-CM | POA: Diagnosis not present

## 2015-04-08 DIAGNOSIS — Z8701 Personal history of pneumonia (recurrent): Secondary | ICD-10-CM | POA: Diagnosis not present

## 2015-04-08 DIAGNOSIS — Z8612 Personal history of poliomyelitis: Secondary | ICD-10-CM | POA: Insufficient documentation

## 2015-04-08 DIAGNOSIS — F419 Anxiety disorder, unspecified: Secondary | ICD-10-CM | POA: Diagnosis not present

## 2015-04-08 DIAGNOSIS — R42 Dizziness and giddiness: Secondary | ICD-10-CM | POA: Insufficient documentation

## 2015-04-08 DIAGNOSIS — F1310 Sedative, hypnotic or anxiolytic abuse, uncomplicated: Secondary | ICD-10-CM | POA: Insufficient documentation

## 2015-04-08 DIAGNOSIS — Z87891 Personal history of nicotine dependence: Secondary | ICD-10-CM | POA: Diagnosis not present

## 2015-04-08 DIAGNOSIS — Z79899 Other long term (current) drug therapy: Secondary | ICD-10-CM | POA: Insufficient documentation

## 2015-04-08 DIAGNOSIS — I10 Essential (primary) hypertension: Secondary | ICD-10-CM | POA: Insufficient documentation

## 2015-04-08 DIAGNOSIS — Z7982 Long term (current) use of aspirin: Secondary | ICD-10-CM | POA: Diagnosis not present

## 2015-04-08 DIAGNOSIS — K219 Gastro-esophageal reflux disease without esophagitis: Secondary | ICD-10-CM | POA: Insufficient documentation

## 2015-04-08 LAB — DIFFERENTIAL
BASOS ABS: 0 10*3/uL (ref 0.0–0.1)
BASOS PCT: 0 %
EOS ABS: 0.2 10*3/uL (ref 0.0–0.7)
Eosinophils Relative: 4 %
Lymphocytes Relative: 23 %
Lymphs Abs: 1.5 10*3/uL (ref 0.7–4.0)
MONOS PCT: 8 %
Monocytes Absolute: 0.5 10*3/uL (ref 0.1–1.0)
NEUTROS PCT: 65 %
Neutro Abs: 4.1 10*3/uL (ref 1.7–7.7)

## 2015-04-08 LAB — URINALYSIS, ROUTINE W REFLEX MICROSCOPIC
Bilirubin Urine: NEGATIVE
GLUCOSE, UA: NEGATIVE mg/dL
HGB URINE DIPSTICK: NEGATIVE
Ketones, ur: NEGATIVE mg/dL
Nitrite: NEGATIVE
PH: 6 (ref 5.0–8.0)
PROTEIN: NEGATIVE mg/dL
SPECIFIC GRAVITY, URINE: 1.017 (ref 1.005–1.030)

## 2015-04-08 LAB — COMPREHENSIVE METABOLIC PANEL
ALBUMIN: 3.4 g/dL — AB (ref 3.5–5.0)
ALT: 13 U/L — ABNORMAL LOW (ref 14–54)
ANION GAP: 11 (ref 5–15)
AST: 18 U/L (ref 15–41)
Alkaline Phosphatase: 81 U/L (ref 38–126)
BUN: 12 mg/dL (ref 6–20)
CHLORIDE: 106 mmol/L (ref 101–111)
CO2: 23 mmol/L (ref 22–32)
Calcium: 10 mg/dL (ref 8.9–10.3)
Creatinine, Ser: 1.13 mg/dL — ABNORMAL HIGH (ref 0.44–1.00)
GFR calc Af Amer: 56 mL/min — ABNORMAL LOW (ref >60)
GFR, EST NON AFRICAN AMERICAN: 48 mL/min — AB (ref >60)
Glucose, Bld: 97 mg/dL (ref 65–99)
POTASSIUM: 3.8 mmol/L (ref 3.5–5.1)
Sodium: 140 mmol/L (ref 135–145)
Total Bilirubin: 0.5 mg/dL (ref 0.3–1.2)
Total Protein: 6.2 g/dL — ABNORMAL LOW (ref 6.5–8.1)

## 2015-04-08 LAB — URINE MICROSCOPIC-ADD ON
BACTERIA UA: NONE SEEN
RBC / HPF: NONE SEEN RBC/hpf (ref 0–5)

## 2015-04-08 LAB — I-STAT CHEM 8, ED
BUN: 15 mg/dL (ref 6–20)
CALCIUM ION: 1.27 mmol/L (ref 1.13–1.30)
Chloride: 104 mmol/L (ref 101–111)
Creatinine, Ser: 1.1 mg/dL — ABNORMAL HIGH (ref 0.44–1.00)
GLUCOSE: 97 mg/dL (ref 65–99)
HCT: 41 % (ref 36.0–46.0)
Hemoglobin: 13.9 g/dL (ref 12.0–15.0)
Potassium: 3.6 mmol/L (ref 3.5–5.1)
SODIUM: 141 mmol/L (ref 135–145)
TCO2: 23 mmol/L (ref 0–100)

## 2015-04-08 LAB — RAPID URINE DRUG SCREEN, HOSP PERFORMED
Amphetamines: NOT DETECTED
BARBITURATES: NOT DETECTED
BENZODIAZEPINES: POSITIVE — AB
COCAINE: NOT DETECTED
OPIATES: NOT DETECTED
Tetrahydrocannabinol: NOT DETECTED

## 2015-04-08 LAB — I-STAT TROPONIN, ED: TROPONIN I, POC: 0 ng/mL (ref 0.00–0.08)

## 2015-04-08 LAB — PROTIME-INR
INR: 1.11 (ref 0.00–1.49)
PROTHROMBIN TIME: 14.5 s (ref 11.6–15.2)

## 2015-04-08 LAB — CBC
HCT: 38.7 % (ref 36.0–46.0)
Hemoglobin: 12.7 g/dL (ref 12.0–15.0)
MCH: 29.1 pg (ref 26.0–34.0)
MCHC: 32.8 g/dL (ref 30.0–36.0)
MCV: 88.6 fL (ref 78.0–100.0)
Platelets: 261 10*3/uL (ref 150–400)
RBC: 4.37 MIL/uL (ref 3.87–5.11)
RDW: 15.4 % (ref 11.5–15.5)
WBC: 6.3 10*3/uL (ref 4.0–10.5)

## 2015-04-08 LAB — APTT: aPTT: 33 seconds (ref 24–37)

## 2015-04-08 LAB — ETHANOL

## 2015-04-08 MED ORDER — LORAZEPAM 2 MG/ML IJ SOLN
1.0000 mg | Freq: Once | INTRAMUSCULAR | Status: AC | PRN
  Administered 2015-04-08: 1 mg via INTRAVENOUS
  Filled 2015-04-08: qty 1

## 2015-04-08 NOTE — ED Notes (Signed)
Patient undressed, in gown, on continuous pulse oximetry and blood pressure cuff 

## 2015-04-08 NOTE — ED Notes (Signed)
Patient placed on monitor

## 2015-04-08 NOTE — ED Provider Notes (Signed)
CSN: [id number]     Arrival date & time 04/08/15  0831 History   First MD Initiated Contact with Patient 04/08/15 [phone]     Chief Complaint  Patient presents with  . Lightheaded/off balance    HPI Patient presents to the emergency room for evaluation of intermittent lightheadedness and imbalance issues for the last week. Patient states symptoms have been coming and going. He is experiencing sensation that she is floating or feeling like she is intoxicated. She is also noticed that her balance is off. She has history of polio and normally has some difficulties when she is walking but the symptoms are worse. She has also noticed this odd sensation in her left arm. It's not specifically weak or numb though. She mentions some vision issues to the nurse denied any vision issues to me. No trouble with her speech. No focal numbness or weakness. She is nonicteric appearance any symptoms right now other than the continued trouble when she walks. Patient denies any trouble with any chest pain. No vomiting or diarrhea. No headache. No fevers or chills.  Past Medical History  Diagnosis Date  . Polio     age 89 years old  . Hypertension   . Mitral valve prolapse   . Pneumonia     years ago  . Bronchitis due to tobacco use (HCC)     quit smoking 3 months ago  . Depression   . Anxiety   . GERD (gastroesophageal reflux disease)   . Chronic sciatica of left side 04/01/2015   Past Surgical History  Procedure Laterality Date  . Abdominal hysterectomy    . Polio Bilateral 1950's    surgery on feet and legs for polio  . Tonsillectomy    . Appendectomy    . Colonoscopy    . Lumbar laminectomy Left 02/21/2014    Procedure: Left L5-S1 Hemilaminectomy, Lateral Recess Decompression, Removal Synovial Cyst;  Surgeon: Eldred MangesMark C Yates, MD;  Location: [facility] OR;  Service: Orthopedics;  Laterality: Left;   Family History  Problem Relation Age of Onset  . Cancer Mother     thyroid  . Heart attack Father   . Cancer  Sister     breast  . Kidney disease Sister     ESRD  . Cancer Sister     thyroid   Social History  Substance Use Topics  . Smoking status: Former Smoker -- 1.50 packs/day for 50 years    Types: Cigarettes    Quit date: 10/21/2013  . Smokeless tobacco: Never Used  . Alcohol Use: 0.0 oz/week    0 Standard drinks or equivalent per week     Comment: occasional   OB History    No data available     Review of Systems  All other systems reviewed and are negative.     Allergies  Review of patient's allergies indicates no known allergies.  Home Medications   Prior to Admission medications   Medication Sig Start Date End Date Taking? Authorizing Provider  aspirin EC 81 MG tablet Take 81 mg by mouth daily.   Yes Historical Provider, MD  BIOTIN PO Take 1 tablet by mouth daily.    Yes Historical Provider, MD  diazepam (VALIUM) 5 MG tablet Take 1 tablet (5 mg total) by mouth every 12 (twelve) hours as needed for anxiety. 02/22/14  Yes Oris DroneBrian D Petrarca, PA-C  DULoxetine (CYMBALTA) 60 MG capsule Take 60 mg by mouth daily. 12/25/13  Yes Historical Provider, MD  losartan-hydrochlorothiazide (HYZAAR) 100-25  MG per tablet Take 1 tablet by mouth daily.   Yes Historical Provider, MD  omeprazole (PRILOSEC) 20 MG capsule Take 20 mg by mouth daily.   Yes Historical Provider, MD  vitamin C (ASCORBIC ACID) 500 MG tablet Take 500 mg by mouth daily.   Yes Historical Provider, MD  zolpidem (AMBIEN) 10 MG tablet Take 10 mg by mouth at bedtime as needed for sleep.   Yes Historical Provider, MD  gabapentin (NEURONTIN) 300 MG capsule One capsule twice a day for one week, then take one in the morning and two in the evening Patient not taking: Reported on 04/08/2015 04/01/15   York Spaniel, MD   BP 138/79 mmHg  Pulse 67  Temp(Src) 97.9 F (36.6 C) (Oral)  Resp 17  SpO2 97% Physical Exam  Constitutional: She is oriented to person, place, and time. She appears well-developed and well-nourished. No  distress.  HENT:  Head: Normocephalic and atraumatic.  Right Ear: External ear normal.  Left Ear: External ear normal.  Mouth/Throat: Oropharynx is clear and moist.  Eyes: Conjunctivae are normal. Right eye exhibits no discharge. Left eye exhibits no discharge. No scleral icterus.  Neck: Neck supple. No tracheal deviation present.  Cardiovascular: Normal rate, regular rhythm and intact distal pulses.   Pulmonary/Chest: Effort normal and breath sounds normal. No stridor. No respiratory distress. She has no wheezes. She has no rales.  Abdominal: Soft. Bowel sounds are normal. She exhibits no distension. There is no tenderness. There is no rebound and no guarding.  Musculoskeletal: She exhibits no edema or tenderness.  Neurological: She is alert and oriented to person, place, and time. She has normal strength. No cranial nerve deficit (No facial droop, extraocular movements intact, tongue midline ) or sensory deficit. She exhibits normal muscle tone. She displays no seizure activity. Coordination normal.  No pronator drift bilateral upper extrem, able to hold both legs off bed for 5 seconds, sensation intact in all extremities, no visual field cuts, no left or right sided neglect, normal finger-nose exam bilaterally, no nystagmus noted   Skin: Skin is warm and dry. No rash noted.  Psychiatric: She has a normal mood and affect.  Nursing note and vitals reviewed.   ED Course  Procedures (including critical care time) Labs Review Labs Reviewed  COMPREHENSIVE METABOLIC PANEL - Abnormal; Notable for the following:    Creatinine, Ser 1.13 (*)    Total Protein 6.2 (*)    Albumin 3.4 (*)    ALT 13 (*)    GFR calc non Af Amer 48 (*)    GFR calc Af Amer 56 (*)    All other components within normal limits  URINE RAPID DRUG SCREEN, HOSP PERFORMED - Abnormal; Notable for the following:    Benzodiazepines POSITIVE (*)    All other components within normal limits  URINALYSIS, ROUTINE W REFLEX  MICROSCOPIC (NOT AT [facility]) - Abnormal; Notable for the following:    APPearance CLOUDY (*)    Leukocytes, UA SMALL (*)    All other components within normal limits  URINE MICROSCOPIC-ADD ON - Abnormal; Notable for the following:    Squamous Epithelial / LPF 0-5 (*)    All other components within normal limits  I-STAT CHEM 8, ED - Abnormal; Notable for the following:    Creatinine, Ser 1.10 (*)    All other components within normal limits  ETHANOL  PROTIME-INR  APTT  CBC  DIFFERENTIAL  Rosezena Sensor, ED    Imaging Review Mr Brain Wo Contrast  04/08/2015  CLINICAL DATA:  Imbalance, dizziness, LEFT hand numbness. Symptoms for 1 week. EXAM: MRI HEAD WITHOUT CONTRAST TECHNIQUE: Multiplanar, multiecho pulse sequences of the brain and surrounding structures were obtained without intravenous contrast. COMPARISON:  CT head 12/16/2010. FINDINGS: No evidence for acute infarction, hemorrhage, mass lesion, hydrocephalus, or extra-axial fluid. Normal for age cerebral volume. No significant white matter disease. Flow voids are maintained throughout the carotid, basilar, and vertebral arteries. There are no areas of chronic hemorrhage. LEFT vertebral dominant. Pituitary, pineal, and cerebellar tonsils unremarkable. No upper cervical lesions. Visualized calvarium, skull base, and upper cervical osseous structures unremarkable. Scalp and extracranial soft tissues, orbits, sinuses, and mastoids show no acute process. IMPRESSION: Negative exam.  No acute or focal intracranial abnormality. Electronically Signed   By: Elsie Stain M.D.   On: 04/08/2015 11:12   I have personally reviewed and evaluated these images and lab results as part of my medical decision-making.   EKG Interpretation   Date/Time:  Wednesday April 08 2015 09:09:58 EST Ventricular Rate:  76 PR Interval:  123 QRS Duration: 70 QT Interval:  393 QTC Calculation: 442 R Axis:   7 Text Interpretation:  Sinus rhythm Atrial premature  complex Baseline  wander in lead(s) III aVF No significant change since last tracing  Confirmed by Leenah Seidner  MD-J, Hitomi Slape ([id number]) on 04/08/2015 9:16:43 AM      MDM   Final diagnoses:  Dizziness    Pt presented to the ED with dizziness.  Concerned about the possibility of occult stroke.  Labs are reassuring.  MRI does not show an acute stroke.  At this time there does not appear to be any evidence of an acute emergency medical condition and the patient appears stable for discharge with appropriate outpatient follow up.     Linwood Dibbles, MD 04/08/15 [id number]

## 2015-04-08 NOTE — Discharge Instructions (Signed)

## 2015-04-08 NOTE — ED Notes (Signed)
Pt from home with c/o intermittent lightheadedness and worsening balance x 1 week.  Pt states she "feels like when I used to drink, or floating on the ocean," with a "funny sensation" in the middle of her forehead.  Pt states it's worsen when her head is not upright and her vision is blurred when her head is not upright.  Denies dizziness, N/V/D, or other complaints. Pt in NAD, A&O.

## 2015-04-08 NOTE — ED Notes (Signed)
Notified Dr Lynelle DoctorKnapp possibility of redrawn blood results showing clotting.

## 2015-04-08 NOTE — ED Notes (Signed)
Patient transported to MRI 

## 2015-04-21 ENCOUNTER — Telehealth: Payer: Self-pay | Admitting: Neurology

## 2015-04-21 MED ORDER — GABAPENTIN 100 MG PO CAPS: 100.0000 mg | ORAL_CAPSULE | Freq: Two times a day (BID) | ORAL | Status: DC

## 2015-04-21 NOTE — Telephone Encounter (Signed)
Pt called sts she started gabapentin (NEURONTIN) 300 MG capsule for the 1st time yesterday as she was "scared of it". She took 1st dose at 1pm and then at 9pm and it "knocked her out". She awoke today at 12:30. Sts she can not take medication if it is going to prohibit her from doing things during the day.

## 2015-04-21 NOTE — Telephone Encounter (Signed)
I called the patient. The gabapentin at 300 mg caused too much drowsiness. I will call in a 100 mg capsule dose.

## 2015-05-24 HISTORY — PX: COLONOSCOPY: SHX174

## 2015-06-16 ENCOUNTER — Emergency Department (INDEPENDENT_AMBULATORY_CARE_PROVIDER_SITE_OTHER): Payer: Medicare PPO

## 2015-06-16 ENCOUNTER — Telehealth: Payer: Self-pay | Admitting: Neurology

## 2015-06-16 ENCOUNTER — Emergency Department (HOSPITAL_COMMUNITY)
Admission: EM | Admit: 2015-06-16 | Discharge: 2015-06-16 | Disposition: A | Discharge status: Home / Self Care | Payer: Medicare PPO | Source: Home / Self Care | Attending: Emergency Medicine | Admitting: Emergency Medicine

## 2015-06-16 ENCOUNTER — Other Ambulatory Visit: Payer: Self-pay

## 2015-06-16 ENCOUNTER — Encounter (HOSPITAL_COMMUNITY): Payer: Self-pay | Admitting: Emergency Medicine

## 2015-06-16 DIAGNOSIS — J441 Chronic obstructive pulmonary disease with (acute) exacerbation: Secondary | ICD-10-CM

## 2015-06-16 MED ORDER — GABAPENTIN 100 MG PO CAPS
100.0000 mg | ORAL_CAPSULE | Freq: Two times a day (BID) | ORAL | Status: DC
Start: 2015-06-16 — End: 2015-07-30

## 2015-06-16 MED ORDER — AMOXICILLIN 500 MG PO CAPS: 500.0000 mg | ORAL_CAPSULE | Freq: Three times a day (TID) | ORAL | Status: DC

## 2015-06-16 MED ORDER — PREDNISONE 50 MG PO TABS: 50.0000 mg | ORAL_TABLET | Freq: Every day | ORAL | Status: DC

## 2015-06-16 NOTE — Telephone Encounter (Signed)
This encounter was already closed when it was sent.  Rx has been sent to [facility] per patient request.

## 2015-06-16 NOTE — Discharge Instructions (Signed)

## 2015-06-16 NOTE — ED Provider Notes (Signed)
CSN: [id number]     Arrival date & time 06/16/15  1504 History   First MD Initiated Contact with Patient 06/16/15 1622     Chief Complaint  Patient presents with  . URI   (Consider location/radiation/quality/duration/timing/severity/associated sxs/prior Treatment) HPI 70 year old female states that she was seen at another urgent care and was advised that she had a URI. She did advise her that she may have a cough. Patient is here today states that she needs an antibiotic. He knows that she has symptoms more serious than a cold. She states that she has tried to use an inhaler in the past and finds it extremely difficult. Patient states that she stopped smoking approximately 3 months ago. Past Medical History  Diagnosis Date  . Polio     age 1 years old  . Hypertension   . Mitral valve prolapse   . Pneumonia     years ago  . Bronchitis due to tobacco use (HCC)     quit smoking 3 months ago  . Depression   . Anxiety   . GERD (gastroesophageal reflux disease)   . Chronic sciatica of left side 04/01/2015   Past Surgical History  Procedure Laterality Date  . Abdominal hysterectomy    . Polio Bilateral 1950's    surgery on feet and legs for polio  . Tonsillectomy    . Appendectomy    . Colonoscopy    . Lumbar laminectomy Left 02/21/2014    Procedure: Left L5-S1 Hemilaminectomy, Lateral Recess Decompression, Removal Synovial Cyst;  Surgeon: Eldred Manges, MD;  Location: [facility] OR;  Service: Orthopedics;  Laterality: Left;   Family History  Problem Relation Age of Onset  . Cancer Mother     thyroid  . Heart attack Father   . Cancer Sister     breast  . Kidney disease Sister     ESRD  . Cancer Sister     thyroid   Social History  Substance Use Topics  . Smoking status: Former Smoker -- 1.50 packs/day for 50 years    Types: Cigarettes    Quit date: 10/21/2013  . Smokeless tobacco: Never Used  . Alcohol Use: 0.0 oz/week    0 Standard drinks or equivalent per week     Comment:  occasional   OB History    No data available     Review of Systems ROS +'ve cough sputum production.  Denies: HEADACHE, NAUSEA, ABDOMINAL PAIN, CHEST PAIN, CONGESTION, DYSURIA, SHORTNESS OF BREATH  Allergies  Review of patient's allergies indicates no known allergies.  Home Medications   Prior to Admission medications   Medication Sig Start Date End Date Taking? Authorizing Provider  aspirin EC 81 MG tablet Take 81 mg by mouth daily.   Yes Historical Provider, MD  DULoxetine (CYMBALTA) 60 MG capsule Take 60 mg by mouth daily. 12/25/13  Yes Historical Provider, MD  losartan-hydrochlorothiazide (HYZAAR) 100-25 MG per tablet Take 1 tablet by mouth daily.   Yes Historical Provider, MD  omeprazole (PRILOSEC) 20 MG capsule Take 20 mg by mouth daily.   Yes Historical Provider, MD  BIOTIN PO Take 1 tablet by mouth daily.     Historical Provider, MD  diazepam (VALIUM) 5 MG tablet Take 1 tablet (5 mg total) by mouth every 12 (twelve) hours as needed for anxiety. 02/22/14   Jetty Peeks, PA-C  gabapentin (NEURONTIN) 100 MG capsule Take 1 capsule (100 mg total) by mouth 2 (two) times daily. 06/16/15   York Spaniel, MD  vitamin C (ASCORBIC ACID) 500 MG tablet Take 500 mg by mouth daily.    Historical Provider, MD  zolpidem (AMBIEN) 10 MG tablet Take 10 mg by mouth at bedtime as needed for sleep.    Historical Provider, MD   Meds Ordered and Administered this Visit  Medications - No data to display  BP 130/76 mmHg  Pulse 88  Temp(Src) 97.6 F (36.4 C) (Oral)  Resp 16  SpO2 97% No data found.   Physical Exam  Constitutional: She is oriented to person, place, and time. She appears well-developed.  HENT:  Head: Normocephalic.  Right Ear: External ear normal.  Left Ear: External ear normal.  Nose: Nose normal.  Mouth/Throat: Oropharynx is clear and moist.  Eyes: Conjunctivae are normal.  Neck: Normal range of motion. Neck supple.  Cardiovascular: Normal rate.   Pulmonary/Chest:  Effort normal and breath sounds normal.  Abdominal: Soft.  Musculoskeletal: Normal range of motion.  Neurological: She is alert and oriented to person, place, and time.  Skin: Skin is warm and dry.  Psychiatric: She has a normal mood and affect. Her behavior is normal. Judgment and thought content normal.  Nursing note and vitals reviewed.   ED Course  Procedures (including critical care time)  Labs Review Labs Reviewed - No data to display  Imaging Review No results found.   Visual Acuity Review  Right Eye Distance:   Left Eye Distance:   Bilateral Distance:    Right Eye Near:   Left Eye Near:    Bilateral Near:        Review of chest x-ray with patient. Changes consistent with COPD no infiltrate. MDM   1. COPD exacerbation (HCC)    Patient is advised to continue home symptomatic treatment. Prescription for amoxicillin, prednisone sent pharmacy patient has indicated. Patient is advised that if there are new or worsening symptoms or attend the emergency department, or contact primary care provider. Instructions of care provided discharged home in stable condition.  THIS NOTE WAS GENERATED USING A VOICE RECOGNITION SOFTWARE PROGRAM. ALL REASONABLE EFFORTS  WERE MADE TO PROOFREAD THIS DOCUMENT FOR ACCURACY.     Tharon Aquas, PA 06/16/15 1724

## 2015-06-16 NOTE — Telephone Encounter (Signed)
Pt called and needs refill on gabapentin (NEURONTIN) 100 MG capsule. Please send to [facility] pharmacy- [phone]

## 2015-06-16 NOTE — ED Notes (Signed)
C/o cold sx onset x1 month Sx include sneezing, wheezing, congestion, runny nose Denies fevers Reports she was treated w/bronchitis x1 month ago Taking OTC cold meds w/no relief.  A&O x4... No acute distress.

## 2015-07-30 ENCOUNTER — Ambulatory Visit (INDEPENDENT_AMBULATORY_CARE_PROVIDER_SITE_OTHER): Payer: Medicare PPO | Admitting: Neurology

## 2015-07-30 ENCOUNTER — Encounter: Payer: Self-pay | Admitting: Neurology

## 2015-07-30 VITALS — BP 120/72 | HR 78 | Resp 20 | Ht 62.0 in | Wt 181.0 lb

## 2015-07-30 DIAGNOSIS — M5432 Sciatica, left side: Secondary | ICD-10-CM

## 2015-07-30 MED ORDER — GABAPENTIN 100 MG PO CAPS: ORAL_CAPSULE | ORAL | Status: DC

## 2015-07-30 NOTE — Patient Instructions (Signed)

## 2015-07-30 NOTE — Progress Notes (Signed)
Reason for visit: Sciatica  Colleen LinerLinda S Bennett is an 70 y.o. female  History of present illness:  [name] is a 70 year old left-handed black female with a history of left-sided sciatica. The patient has had some good improvement with the gabapentin initially, she went for almost 2 months essentially pain-free. Around the middle portion of February 2017, the pain seemed to return. The patient has her usual pain in the left thigh, across the back, and down into the dorsum of the left foot. The patient has a history of polio, she has some weakness of the right leg with a footdrop. The patient has not had any falls, she has not injured the back. She takes Ambien at night for sleep, but the combination of the gabapentin and Ambien at night makes her so drowsy that she has difficulty with awakening in the morning. She has a sensation of some weakness in the left leg. She returns for an evaluation.  Past Medical History  Diagnosis Date  . Polio     age 70 years old  . Hypertension   . Mitral valve prolapse   . Pneumonia     years ago  . Bronchitis due to tobacco use (HCC)     quit smoking 3 months ago  . Depression   . Anxiety   . GERD (gastroesophageal reflux disease)   . Chronic sciatica of left side 04/01/2015    Past Surgical History  Procedure Laterality Date  . Abdominal hysterectomy    . Polio Bilateral 1950's    surgery on feet and legs for polio  . Tonsillectomy    . Appendectomy    . Colonoscopy    . Lumbar laminectomy Left 02/21/2014    Procedure: Left L5-S1 Hemilaminectomy, Lateral Recess Decompression, Removal Synovial Cyst;  Surgeon: Eldred MangesMark C Yates, MD;  Location: [facility] OR;  Service: Orthopedics;  Laterality: Left;    Family History  Problem Relation Age of Onset  . Cancer Mother     thyroid  . Heart attack Father   . Cancer Sister     breast  . Kidney disease Sister     ESRD  . Cancer Sister     thyroid    Social history:  reports that she quit smoking about 21  months ago. Her smoking use included Cigarettes. She has a 75 pack-year smoking history. She has never used smokeless tobacco. She reports that she drinks alcohol. She reports that she does not use illicit drugs.   No Known Allergies  Medications:  Prior to Admission medications   Medication Sig Start Date End Date Taking? Authorizing Provider  aspirin EC 81 MG tablet Take 81 mg by mouth daily.   Yes Historical Provider, MD  BIOTIN PO Take 1 tablet by mouth daily.    Yes Historical Provider, MD  diazepam (VALIUM) 5 MG tablet Take 1 tablet (5 mg total) by mouth every 12 (twelve) hours as needed for anxiety. 02/22/14  Yes Oris DroneBrian D Petrarca, PA-C  DULoxetine (CYMBALTA) 60 MG capsule Take 60 mg by mouth daily. 12/25/13  Yes Historical Provider, MD  gabapentin (NEURONTIN) 100 MG capsule Take 1 capsule (100 mg total) by mouth 2 (two) times daily. 06/16/15  Yes York Spanielharles K Benton Tooker, MD  losartan-hydrochlorothiazide (HYZAAR) 100-25 MG per tablet Take 1 tablet by mouth daily.   Yes Historical Provider, MD  omeprazole (PRILOSEC) 20 MG capsule Take 20 mg by mouth daily.   Yes Historical Provider, MD  zolpidem (AMBIEN) 10 MG tablet Take 10  mg by mouth at bedtime as needed for sleep.   Yes Historical Provider, MD    ROS:  Out of a complete 14 system review of symptoms, the patient complains only of the following symptoms, and all other reviewed systems are negative.  Fatigue Difficulty swallowing Eye pain, blurred vision Wheezing Cold intolerance Insomnia, daytime sleepiness Joint pain, back pain, muscle cramps, walking difficulty Headache Depression, anxiety  Blood pressure 120/72, pulse 78, resp. rate 20, height  (1.575 m), weight 181 lb (82.101 kg).  Physical Exam  General: The patient is alert and cooperative at the time of the examination.  Skin: No significant peripheral edema is noted.   Neurologic Exam  Mental status: The patient is alert and oriented x 3 at the time of the  examination. The patient has apparent normal recent and remote memory, with an apparently normal attention span and concentration ability.   Cranial nerves: Facial symmetry is present. Speech is normal, no aphasia or dysarthria is noted. Extraocular movements are full. Visual fields are full.  Motor: The patient has good strength in all 4 extremities, with exception of a footdrop on the right.  Sensory examination: Soft touch sensation is symmetric on the face, arms, and legs.  Coordination: The patient has good finger-nose-finger and heel-to-shin bilaterally.  Gait and station: The patient has a slightly wide-based gait. Tandem gait was not attempted. Romberg is negative. No drift is seen.  Reflexes: Deep tendon reflexes are symmetric, but are depressed.   Assessment/Plan:  1. Left-sided sciatica  2. History polio, right leg weakness  The patient could not tolerate the 300 mg capsules of gabapentin, but she seemed initially to get benefit with the 100 mg capsules twice daily. The patient will go up on the medication taking 100 mg in the morning, 200 mg in the evening, and stop the Ambien. She is to follow-up in 4 months. We will continue to go up on the dose of the gabapentin as tolerated as long she is having pain. We may consider an epidural steroid injection in the future. The patient feels better when she is moving, prolonged sitting or standing increases the pain.  Marlan Palau MD 07/30/2015 7:46 PM  [facility] [address] [place], [place] [address]  Phone [phone] Fax [phone]

## 2015-11-26 DIAGNOSIS — H9312 Tinnitus, left ear: Secondary | ICD-10-CM | POA: Insufficient documentation

## 2015-12-01 ENCOUNTER — Encounter: Payer: Self-pay | Admitting: Adult Health

## 2015-12-01 ENCOUNTER — Ambulatory Visit (INDEPENDENT_AMBULATORY_CARE_PROVIDER_SITE_OTHER): Payer: Medicare PPO | Admitting: Adult Health

## 2015-12-01 VITALS — BP 121/77 | HR 93 | Resp 22 | Ht 62.0 in | Wt 181.0 lb

## 2015-12-01 DIAGNOSIS — M5432 Sciatica, left side: Secondary | ICD-10-CM

## 2015-12-01 NOTE — Progress Notes (Signed)
I have read the note, and I agree with the clinical assessment and plan.  WILLIS,CHARLES KEITH   

## 2015-12-01 NOTE — Progress Notes (Signed)
PATIENT: [name] DOB: 10/27/1945  REASON FOR VISIT: follow up-left-sided sciatica HISTORY FROM: patient  HISTORY OF PRESENT ILLNESS: [name] is a 70 year old female with a history of left-sided sciatica. She returns today for follow-up. She was restarted on gabapentin at the last visit. She states that she did wean herself back down to only 100 mg daily because she was still sleepy during the day. However she states that with decreasing her dose of gabapentin her daytime sleepiness did not improve. She states that she tried to stop Ambien for 1 week but could not fall asleep at night. She states that she continues to have pain in the lower back radiating down the left leg. She will occasionally have pain in the right leg from polio. She has had epidural steroid injections by Dr. Lonni FixNolan in the past. She reports moderate benefit. She is also tried acupuncture with minimal benefit. She states because of the pain she is more depressed and tearful. She states that she has been in bed for the last 4 days due to increased depression. She returns today for an evaluation.   HISTORY 07/30/15: [name] is a 70 year old left-handed black female with a history of left-sided sciatica. The patient has had some good improvement with the gabapentin initially, she went for almost 2 months essentially pain-free. Around the middle portion of February 2017, the pain seemed to return. The patient has her usual pain in the left thigh, across the back, and down into the dorsum of the left foot. The patient has a history of polio, she has some weakness of the right leg with a footdrop. The patient has not had any falls, she has not injured the back. She takes Ambien at night for sleep, but the combination of the gabapentin and Ambien at night makes her so drowsy that she has difficulty with awakening in the morning. She has a sensation of some weakness in the left leg. She returns for an evaluation.  REVIEW OF  SYSTEMS: Out of a complete 14 system review of symptoms, the patient complains only of the following symptoms, and all other reviewed systems are negative.  Wheezing, constipation, light sensitivity, fatigue, activity change, appetite change, trouble swallowing, daytime sleepiness, sleep talking, back pain, aching muscles, muscle cramps, walking difficulty, dizziness, depression, anxious  ALLERGIES: Allergies  Allergen Reactions  . Codeine Nausea Only    HOME MEDICATIONS: Outpatient Prescriptions Prior to Visit  Medication Sig Dispense Refill  . aspirin EC 81 MG tablet Take 81 mg by mouth daily.    Marland Kitchen. BIOTIN PO Take 1 tablet by mouth daily.     . diazepam (VALIUM) 5 MG tablet Take 1 tablet (5 mg total) by mouth every 12 (twelve) hours as needed for anxiety. 10 tablet 0  . DULoxetine (CYMBALTA) 60 MG capsule Take 60 mg by mouth daily.    Marland Kitchen. gabapentin (NEURONTIN) 100 MG capsule One capsule in the morning and two in the evening 270 capsule 1  . losartan-hydrochlorothiazide (HYZAAR) 100-25 MG per tablet Take 1 tablet by mouth daily.    Marland Kitchen. omeprazole (PRILOSEC) 20 MG capsule Take 20 mg by mouth daily.    Marland Kitchen. zolpidem (AMBIEN) 10 MG tablet Take 10 mg by mouth at bedtime as needed for sleep.     No facility-administered medications prior to visit.    PAST MEDICAL HISTORY: Past Medical History  Diagnosis Date  . Polio     age 70 years old  . Hypertension   . Mitral  valve prolapse   . Pneumonia     years ago  . Bronchitis due to tobacco use (HCC)     quit smoking 3 months ago  . Depression   . Anxiety   . GERD (gastroesophageal reflux disease)   . Chronic sciatica of left side 04/01/2015    PAST SURGICAL HISTORY: Past Surgical History  Procedure Laterality Date  . Abdominal hysterectomy    . Polio Bilateral 1950's    surgery on feet and legs for polio  . Tonsillectomy    . Appendectomy    . Colonoscopy    . Lumbar laminectomy Left 02/21/2014    Procedure: Left L5-S1  Hemilaminectomy, Lateral Recess Decompression, Removal Synovial Cyst;  Surgeon: Eldred Manges, MD;  Location: [facility] OR;  Service: Orthopedics;  Laterality: Left;    FAMILY HISTORY: Family History  Problem Relation Age of Onset  . Cancer Mother     thyroid  . Heart attack Father   . Cancer Sister     breast  . Kidney disease Sister     ESRD  . Cancer Sister     thyroid    SOCIAL HISTORY: Social History   Social History  . Marital Status: Married    Spouse Name: [name]  . Number of Children: 1  . Years of Education: College   Occupational History  . Retired Other   Social History Main Topics  . Smoking status: Former Smoker -- 1.50 packs/day for 50 years    Types: Cigarettes    Quit date: 10/21/2013  . Smokeless tobacco: Never Used  . Alcohol Use: 0.0 oz/week    0 Standard drinks or equivalent per week     Comment: occasional  . Drug Use: No  . Sexual Activity: Not on file   Other Topics Concern  . Not on file   Social History Narrative   Patient lives at home with her spouse.   Caffeine Use: daily      PHYSICAL EXAM  Filed Vitals:   12/01/15 1304  BP: 121/77  Pulse: 93  Resp: 22  Height:  (1.575 m)  Weight: 181 lb (82.101 kg)   Body mass index is 33.1 kg/(m^2).  Generalized: Well developed, in no acute distress   Neurological examination  Mentation: Alert oriented to time, place, history taking. Follows all commands speech and language fluent Cranial nerve II-XII: Pupils were equal round reactive to light. Extraocular movements were full, visual field were full on confrontational test. Facial sensation and strength were normal. Uvula tongue midline. Head turning and shoulder shrug  were normal and symmetric. Motor: The motor testing reveals 5 over 5 strength of all 4 extremities.  Right foot drop. Sensory: Sensory testing is intact to soft touch on all 4 extremities. No evidence of extinction is noted.  Coordination: Cerebellar testing reveals good  finger-nose-finger and heel-to-shin bilaterally.  Gait and station:  Gait is slightly wide-based. Tandem gait not attempted. Romberg is negative. Reflexes: Deep tendon reflexes are symmetric and normal bilaterally.   DIAGNOSTIC DATA (LABS, IMAGING, TESTING) - I reviewed patient records, labs, notes, testing and imaging myself where available.  Lab Results  Component Value Date   WBC 6.3 04/08/2015   HGB 13.9 04/08/2015   HCT 41.0 04/08/2015   MCV 88.6 04/08/2015   PLT 261 04/08/2015      Component Value Date/Time   NA 141 04/08/2015 0940   K 3.6 04/08/2015 0940   CL 104 04/08/2015 0940   CO2 23 04/08/2015 4098  GLUCOSE 97 04/08/2015 0940   BUN 15 04/08/2015 0940   CREATININE 1.10* 04/08/2015 0940   CALCIUM 10.0 04/08/2015 0937   PROT 6.2* 04/08/2015 0937   ALBUMIN 3.4* 04/08/2015 0937   AST 18 04/08/2015 0937   ALT 13* 04/08/2015 0937   ALKPHOS 81 04/08/2015 0937   BILITOT 0.5 04/08/2015 0937   GFRNONAA 48* 04/08/2015 0937   GFRAA 56* 04/08/2015 0937       ASSESSMENT AND PLAN 70 y.o. year old female  has a past medical history of Polio; Hypertension; Mitral valve prolapse; Pneumonia; Bronchitis due to tobacco use (HCC); Depression; Anxiety; GERD (gastroesophageal reflux disease); and Chronic sciatica of left side (04/01/2015). here with:  1. Left-sided sciatica  The patient states that she would like to increase her gabapentin again. She will begin by doing 100 mg in the morning and 200 mg in the evening. If she is tolerating this well we can increase to 300 mg in the evening. Increasing her evening dose may also help with sleep. I have spoken to the patient about weaning off of Ambien to see if this improves her daytime sleepiness. Patient verbalized understanding. She will follow-up in 3 months or sooner if needed.   Butch Penny, MSN, NP-C 12/01/2015, 1:12 PM [facility] [address] [place], [place] [address] [phone]

## 2015-12-01 NOTE — Patient Instructions (Signed)
Try taking gabapentin 100 mg in the morning and 200 mg in the evening. If tolerating can increase gabapentin at bedtime to 300 mg Stop Ambien

## 2015-12-15 ENCOUNTER — Telehealth: Payer: Self-pay | Admitting: Neurology

## 2015-12-15 NOTE — Telephone Encounter (Signed)
Pt called in wanting information on gabapentin (NEURONTIN) 100 MG capsule. She says that she is having a hard time waking up and going to bed. Please call

## 2015-12-15 NOTE — Telephone Encounter (Signed)
Spoke to pt. She reports excessive daytime sleepiness since taking gabapentin 100 mg in the morning and 200 mg in the evening. Says that she is sleeping well at night and has not had to use Ambien at bedtime for the past week. Asked about decreasing gabapentin dose. Will try skipping morning dose and taking medication only at bedtime. Agreed to call back in a few days w/ update.

## 2016-03-03 ENCOUNTER — Encounter (INDEPENDENT_AMBULATORY_CARE_PROVIDER_SITE_OTHER): Payer: Medicare PPO | Admitting: Physical Medicine and Rehabilitation

## 2016-03-08 ENCOUNTER — Encounter (INDEPENDENT_AMBULATORY_CARE_PROVIDER_SITE_OTHER): Payer: Medicare PPO | Admitting: Physical Medicine and Rehabilitation

## 2016-03-08 DIAGNOSIS — M5416 Radiculopathy, lumbar region: Secondary | ICD-10-CM | POA: Diagnosis not present

## 2016-03-16 ENCOUNTER — Ambulatory Visit: Payer: Medicare PPO | Admitting: Adult Health

## 2016-03-30 ENCOUNTER — Ambulatory Visit (INDEPENDENT_AMBULATORY_CARE_PROVIDER_SITE_OTHER): Payer: Medicare PPO | Admitting: Adult Health

## 2016-03-30 ENCOUNTER — Encounter: Payer: Self-pay | Admitting: Adult Health

## 2016-03-30 VITALS — BP 119/72 | HR 80 | Resp 20 | Ht 62.0 in | Wt 181.0 lb

## 2016-03-30 DIAGNOSIS — M545 Low back pain, unspecified: Secondary | ICD-10-CM

## 2016-03-30 DIAGNOSIS — R51 Headache: Secondary | ICD-10-CM | POA: Diagnosis not present

## 2016-03-30 DIAGNOSIS — R400 Somnolence: Secondary | ICD-10-CM

## 2016-03-30 DIAGNOSIS — G4700 Insomnia, unspecified: Secondary | ICD-10-CM

## 2016-03-30 DIAGNOSIS — R0683 Snoring: Secondary | ICD-10-CM | POA: Diagnosis not present

## 2016-03-30 DIAGNOSIS — R519 Headache, unspecified: Secondary | ICD-10-CM

## 2016-03-30 MED ORDER — GABAPENTIN 100 MG PO CAPS: ORAL_CAPSULE | ORAL | 3 refills | Status: DC

## 2016-03-30 NOTE — Progress Notes (Signed)
PATIENT: Colleen LinerLinda S Bennett DOB: 08/10/1945  REASON FOR VISIT: follow up- left-sided sciatica HISTORY FROM: patient  HISTORY OF PRESENT ILLNESS: [name] is a 70 year old female with a history of left-sided sciatica. She returns today for follow-up. She states that she did receive an epidural steroid injection through [facility]. This occurred in October. She finds that it has been beneficial. She states that she does not have pain every day. When she does have pain she rates it a 4 out of 10 on the pain scale. The pain is still in the left lower back. However it does not radiate down the leg. She states occasionally she'll have pain in the left thigh and the left great toe. She describes this pain as sharp /shooting. She states that she continues to have trouble sleeping at night. She states that she tosses and turns most nights. She does state that she snores. She does have daytime sleepiness-- even off of gabapentin. Reports occasionally she does wake up with a dull morning headache. She returns today for an evaluation.  HISTORY 12/01/15: [name] is a 70 year old female with a history of left-sided sciatica. She returns today for follow-up. She was restarted on gabapentin at the last visit. She states that she did wean herself back down to only 100 mg daily because she was still sleepy during the day. However she states that with decreasing her dose of gabapentin her daytime sleepiness did not improve. She states that she tried to stop Ambien for 1 week but could not fall asleep at night. She states that she continues to have pain in the lower back radiating down the left leg. She will occasionally have pain in the right leg from polio. She has had epidural steroid injections by Dr. Lonni FixNolan in the past. She reports moderate benefit. She is also tried acupuncture with minimal benefit. She states because of the pain she is more depressed and tearful. She states that she has been in bed for the  last 4 days due to increased depression. She returns today for an evaluation.   HISTORY 07/30/15: [name] is a 70 year old left-handed black female with a history of left-sided sciatica. The patient has had some good improvement with the gabapentin initially, she went for almost 2 months essentially pain-free. Around the middle portion of February 2017, the pain seemed to return. The patient has her usual pain in the left thigh, across the back, and down into the dorsum of the left foot. The patient has a history of polio, she has some weakness of the right leg with a footdrop. The patient has not had any falls, she has not injured the back. She takes Ambien at night for sleep, but the combination of the gabapentin and Ambien at night makes her so drowsy that she has difficulty with awakening in the morning. She has a sensation of some weakness in the left leg. She returns for an evaluation.;';  REVIEW OF SYSTEMS: Out of a complete 14 system review of symptoms, the patient complains only of the following symptoms, and all other reviewed systems are negative.  See history of present illness  ALLERGIES: Allergies  Allergen Reactions  . Codeine Nausea Only    HOME MEDICATIONS: Outpatient Medications Prior to Visit  Medication Sig Dispense Refill  . aspirin EC 81 MG tablet Take 81 mg by mouth daily.    Marland Kitchen. BIOTIN PO Take 1 tablet by mouth daily.     . diazepam (VALIUM) 5 MG tablet Take  1 tablet (5 mg total) by mouth every 12 (twelve) hours as needed for anxiety. 10 tablet 0  . DULoxetine (CYMBALTA) 60 MG capsule Take 60 mg by mouth daily.    Marland Kitchen gabapentin (NEURONTIN) 100 MG capsule One capsule in the morning and two in the evening 270 capsule 1  . losartan-hydrochlorothiazide (HYZAAR) 100-25 MG per tablet Take 1 tablet by mouth daily.    Marland Kitchen omeprazole (PRILOSEC) 20 MG capsule Take 20 mg by mouth daily.    Marland Kitchen zolpidem (AMBIEN) 10 MG tablet Take 10 mg by mouth at bedtime as needed for sleep.     No  facility-administered medications prior to visit.     PAST MEDICAL HISTORY: Past Medical History:  Diagnosis Date  . Anxiety   . Bronchitis due to tobacco use (HCC)    quit smoking 3 months ago  . Chronic sciatica of left side 04/01/2015  . Depression   . GERD (gastroesophageal reflux disease)   . Hypertension   . Mitral valve prolapse   . Pneumonia    years ago  . Polio    age 17 years old    PAST SURGICAL HISTORY: Past Surgical History:  Procedure Laterality Date  . ABDOMINAL HYSTERECTOMY    . APPENDECTOMY    . COLONOSCOPY    . LUMBAR LAMINECTOMY Left 02/21/2014   Procedure: Left L5-S1 Hemilaminectomy, Lateral Recess Decompression, Removal Synovial Cyst;  Surgeon: Eldred Manges, MD;  Location: [facility] OR;  Service: Orthopedics;  Laterality: Left;  . polio Bilateral 1950's   surgery on feet and legs for polio  . TONSILLECTOMY      FAMILY HISTORY: Family History  Problem Relation Age of Onset  . Cancer Mother     thyroid  . Heart attack Father   . Cancer Sister     breast  . Kidney disease Sister     ESRD  . Cancer Sister     thyroid    SOCIAL HISTORY: Social History   Social History  . Marital status: Married    Spouse name: [name]  . Number of children: 1  . Years of education: College   Occupational History  . Retired Other   Social History Main Topics  . Smoking status: Former Smoker    Packs/day: 1.50    Years: 50.00    Types: Cigarettes    Quit date: 10/21/2013  . Smokeless tobacco: Never Used  . Alcohol use 0.0 oz/week     Comment: occasional  . Drug use: No  . Sexual activity: Not on file   Other Topics Concern  . Not on file   Social History Narrative   Patient lives at home with her spouse.   Caffeine Use: daily      PHYSICAL EXAM  Vitals:   03/30/16 1423  BP: 119/72  Pulse: 80  Resp: 20  Weight: 181 lb (82.1 kg)  Height: 5\' 2"  (1.575 m)   Body mass index is 33.11 kg/m.  Generalized: Well developed, in no acute distress    Neck: Circumference 17 inches, Mallampati 4+  Neurological examination  Mentation: Alert oriented to time, place, history taking. Follows all commands speech and language fluent Cranial nerve II-XII: Pupils were equal round reactive to light. Extraocular movements were full, visual field were full on confrontational test. Facial sensation and strength were normal. Uvula tongue midline. Head turning and shoulder shrug  were normal and symmetric. Motor: The motor testing reveals 5 over 5 strength of all 4 extremities. Good symmetric motor tone  is noted throughout.  Chronic foot drop on the right. Sensory: Sensory testing is intact to soft touch on all 4 extremities. No evidence of extinction is noted.  Coordination: Cerebellar testing reveals good finger-nose-finger and heel-to-shin bilaterally.  Gait and station: Gait is normal. Tandem gait is normal. Romberg is negative. No drift is seen.  Reflexes: Deep tendon reflexes are symmetric and normal bilaterally.   DIAGNOSTIC DATA (LABS, IMAGING, TESTING) - I reviewed patient records, labs, notes, testing and imaging myself where available.  Lab Results  Component Value Date   WBC 6.3 04/08/2015   HGB 13.9 04/08/2015   HCT 41.0 04/08/2015   MCV 88.6 04/08/2015   PLT 261 04/08/2015      Component Value Date/Time   NA 141 04/08/2015 0940   K 3.6 04/08/2015 0940   CL 104 04/08/2015 0940   CO2 23 04/08/2015 0937   GLUCOSE 97 04/08/2015 0940   BUN 15 04/08/2015 0940   CREATININE 1.10 (H) 04/08/2015 0940   CALCIUM 10.0 04/08/2015 0937   PROT 6.2 (L) 04/08/2015 0937   ALBUMIN 3.4 (L) 04/08/2015 0937   AST 18 04/08/2015 0937   ALT 13 (L) 04/08/2015 0937   ALKPHOS 81 04/08/2015 0937   BILITOT 0.5 04/08/2015 0937   GFRNONAA 48 (L) 04/08/2015 0937   GFRAA 56 (L) 04/08/2015 0937   Lab Results  Component Value Date   CHOL 239 (H) 02/18/2011   HDL 40 02/18/2011   LDLCALC 177 (H) 02/18/2011   TRIG 112 02/18/2011   CHOLHDL 6.0 02/18/2011       ASSESSMENT AND PLAN 70 y.o. year old female  has a past medical history of Anxiety; Bronchitis due to tobacco use (HCC); Chronic sciatica of left side (04/01/2015); Depression; GERD (gastroesophageal reflux disease); Hypertension; Mitral valve prolapse; Pneumonia; and Polio. here with:  1. Left- sided sciatica  2. Snoring 3. Excessive daytime sleepiness 4. Morning headache  The patient has recieved good relief with epidural steroid injections. For now she will continue taking gabapentin 100 mg in the morning and 200 mg in the evening. I will send the patient for a sleep evaluation to rule out possible sleep apnea. Patient is amenable to this plan. She will follow-up in 6 months or sooner if needed.   Butch PennyMegan Anya Murphey, MSN, NP-C 03/30/2016, 2:35 PM [facility] [address], [address] [place], [place] [id number] [phone]

## 2016-03-30 NOTE — Patient Instructions (Addendum)
Continue gabapentin 100 mg twice a day. May take an extra tablet if needed Referral to sleep medicine If your symptoms worsen or you develop new symptoms please let us know.

## 2016-03-30 NOTE — Progress Notes (Signed)
I have read the note, and I agree with the clinical assessment and plan.  [name],[name]   

## 2016-04-27 ENCOUNTER — Ambulatory Visit (INDEPENDENT_AMBULATORY_CARE_PROVIDER_SITE_OTHER): Payer: Medicare PPO | Admitting: Neurology

## 2016-04-27 ENCOUNTER — Telehealth (INDEPENDENT_AMBULATORY_CARE_PROVIDER_SITE_OTHER): Payer: Self-pay | Admitting: Orthopaedic Surgery

## 2016-04-27 ENCOUNTER — Encounter: Payer: Self-pay | Admitting: Neurology

## 2016-04-27 VITALS — BP 142/98 | HR 76 | Resp 20 | Ht 62.0 in | Wt 183.0 lb

## 2016-04-27 DIAGNOSIS — F17200 Nicotine dependence, unspecified, uncomplicated: Secondary | ICD-10-CM | POA: Diagnosis not present

## 2016-04-27 DIAGNOSIS — R51 Headache: Secondary | ICD-10-CM

## 2016-04-27 DIAGNOSIS — R400 Somnolence: Secondary | ICD-10-CM

## 2016-04-27 DIAGNOSIS — M5431 Sciatica, right side: Secondary | ICD-10-CM

## 2016-04-27 DIAGNOSIS — E669 Obesity, unspecified: Secondary | ICD-10-CM | POA: Diagnosis not present

## 2016-04-27 DIAGNOSIS — G4759 Other parasomnia: Secondary | ICD-10-CM

## 2016-04-27 DIAGNOSIS — R0683 Snoring: Secondary | ICD-10-CM | POA: Diagnosis not present

## 2016-04-27 DIAGNOSIS — M5432 Sciatica, left side: Secondary | ICD-10-CM

## 2016-04-27 DIAGNOSIS — R519 Headache, unspecified: Secondary | ICD-10-CM

## 2016-04-27 NOTE — Telephone Encounter (Signed)
Ok refill call in thanks

## 2016-04-27 NOTE — Telephone Encounter (Signed)
Please advise 

## 2016-04-27 NOTE — Progress Notes (Signed)
Subjective:    Patient ID: [name] is a 70 y.o. female.  HPI     Huston Foley, MD, PhD [facility] [address] [address] [place], [place] [address]  Dear [name] and [name],   I saw your patient, [name], upon your kind request in my clinic today for initial consultation of her sleep disorder, in particular, concern for underlying obstructive sleep apnea. The patient is unaccompanied today. As you know, Ms. Court is a 70 year old right-handed woman with an underlying medical history of hypertension, smoking, chronic bronchitis, depression, anxiety, mitral valve prolapse, post polio, left-sided sciatica and obesity, who reports snoring and excessive daytime somnolence. I reviewed your office note from 03/30/2016. She has been on generic Ambien 10 mg each night for 7-8 years, per PCP. She has had some eating in the middle of the night, not remembering. Lives with husband, but he works outside the home and is away during the week; truck Hospital doctor.  She has a dull mild HA occasionally in the mornings. She has no nocturia, no RLS symptoms. She had leg surgeries. Has has tendon transfer on the R leg. Lower back surgeries twice, as needed. Tylenol #3.  Has one daughter and one GD. She used to work as a Occupational psychologist for [employer]. She does not drink alcohol. She quit smoking and then restarted smoking. Her Epworth sleepiness score is 14 out of 24 today, her fatigue score is 48 out of 63. She admits that she does not hydrate well enough. She drinks about 16-20 ounces of soda per day, not enough water, maybe one 16 ounce bottle. She also does not eat on a regular basis. She does not keep a good sleep and wake schedule. She has the TV on a timer at night. Bedtime may vary between 9 PM and midnight and sometimes as late as early morning hours. Wakeup time is between 9:30 and 10 but typically is in bed until 11 or 11:30. She would be willing to try a CPAP machine.  She knows that she could lose some weight and has worked on it off and on. She is a restless sleeper.  Her Past Medical History Is Significant For: Past Medical History:  Diagnosis Date  . Anxiety   . Bronchitis due to tobacco use (HCC)    quit smoking 3 months ago  . Chronic sciatica of left side 04/01/2015  . Depression   . GERD (gastroesophageal reflux disease)   . Hypertension   . Mitral valve prolapse   . Pneumonia    years ago  . Polio    age 66 years old    Her Past Surgical History Is Significant For: Past Surgical History:  Procedure Laterality Date  . ABDOMINAL HYSTERECTOMY    . APPENDECTOMY    . COLONOSCOPY    . LUMBAR LAMINECTOMY Left 02/21/2014   Procedure: Left L5-S1 Hemilaminectomy, Lateral Recess Decompression, Removal Synovial Cyst;  Surgeon: Eldred Manges, MD;  Location: [facility] OR;  Service: Orthopedics;  Laterality: Left;  . polio Bilateral 1950's   surgery on feet and legs for polio  . TONSILLECTOMY      Her Family History Is Significant For: Family History  Problem Relation Age of Onset  . Cancer Mother     thyroid  . Heart attack Father   . Cancer Sister     breast  . Kidney disease Sister     ESRD  . Cancer Sister     thyroid  Her Social History Is Significant For: Social History   Social History  . Marital status: Married    Spouse name: Molly MaduroRobert  . Number of children: 1  . Years of education: College   Occupational History  . Retired Other   Social History Main Topics  . Smoking status: Former Smoker    Packs/day: 1.50    Years: 50.00    Types: Cigarettes    Quit date: 10/21/2013  . Smokeless tobacco: Never Used  . Alcohol use 0.0 oz/week     Comment: occasional  . Drug use: No  . Sexual activity: Not Asked   Other Topics Concern  . None   Social History Narrative   Patient lives at home with her spouse.   Caffeine Use: daily    Her Allergies Are:  No Known Allergies:   Her Current Medications Are:  Outpatient Encounter  Prescriptions as of 04/27/2016  Medication Sig  . acetaminophen-codeine (TYLENOL #3) 300-30 MG tablet   . Ascorbic Acid (VITAMIN C) 1000 MG tablet Take 1,000 mg by mouth daily.  Marland Kitchen. aspirin EC 81 MG tablet Take 81 mg by mouth daily.  Marland Kitchen. atorvastatin (LIPITOR) 40 MG tablet Take 40 mg by mouth daily.  Marland Kitchen. BIOTIN PO Take 1 tablet by mouth daily.   . cholecalciferol (VITAMIN D) 1000 units tablet Take 1,000 Units by mouth daily.  . diazepam (VALIUM) 5 MG tablet Take 1 tablet (5 mg total) by mouth every 12 (twelve) hours as needed for anxiety.  . DULoxetine (CYMBALTA) 60 MG capsule Take 60 mg by mouth daily.  Marland Kitchen. gabapentin (NEURONTIN) 100 MG capsule One capsule in the morning and two in the evening  . losartan-hydrochlorothiazide (HYZAAR) 100-25 MG per tablet Take 1 tablet by mouth daily.  Marland Kitchen. omeprazole (PRILOSEC) 20 MG capsule Take 20 mg by mouth daily.  Marland Kitchen. zolpidem (AMBIEN) 10 MG tablet Take 10 mg by mouth at bedtime as needed for sleep.   No facility-administered encounter medications on file as of 04/27/2016.   :  Review of Systems:  Out of a complete 14 point review of systems, all are reviewed and negative with the exception of these symptoms as listed below: Review of Systems  Neurological:       Pt presents today to discuss the possibility of having a sleep study. Pt reports that she does snore. Pt thinks that she has had a sleep study before, but it has been about 30 years ago and she does not remember it.  Epworth Sleepiness Scale 0= would never doze 1= slight chance of dozing 2= moderate chance of dozing 3= high chance of dozing  Sitting and reading: 2 Watching TV: 2 Sitting inactive in a public place (ex. Theater or meeting): 2 As a passenger in a car for an hour without a break: 2 Lying down to rest in the afternoon: 3 Sitting and talking to someone: 1 Sitting quietly after lunch (no alcohol): 2 In a car, while stopped in traffic: 0 Total: 14   Psychiatric/Behavioral: Positive  for sleep disturbance.    Objective:  Neurologic Exam  Physical Exam Physical Examination:   Vitals:   04/27/16 1349  BP: (!) 142/98  Pulse: 76  Resp: 20    General Examination: The patient is a very pleasant 70 y.o. female in no acute distress. She appears well-developed and well-nourished and well groomed.   HEENT: Normocephalic, atraumatic, pupils are equal, round and reactive to light and accommodation. Funduscopic exam is normal with sharp disc margins noted.  S/p b/l cataract repairs. Extraocular tracking is good without limitation to gaze excursion or nystagmus noted. Normal smooth pursuit is noted. Hearing is grossly intact. Face is symmetric with normal facial animation and normal facial sensation. Speech is clear with no dysarthria noted. There is no hypophonia. There is no lip, neck/head, jaw or voice tremor. Neck is supple with full range of passive and active motion. There are no carotid bruits on auscultation. Oropharynx exam reveals: mild mouth dryness, edentulous stable, and moderate airway crowding, due to large tongue and redundant . Mallampati is class III. Tongue protrudes centrally and palate elevates symmetrically. Tonsils are absent. Neck size is 17 inches.    Chest: Clear to auscultation without wheezing, rhonchi or crackles noted.  Heart: S1+S2+0, regular and normal without murmurs, rubs or gallops noted.   Abdomen: Soft, non-tender and non-distended with normal bowel sounds appreciated on auscultation.  Extremities: There is trace pitting edema in the distal lower extremities bilaterally. Pedal pulses are intact.  Skin: Warm and dry without trophic changes noted. There are no varicose veins.  Musculoskeletal: exam reveals no obvious joint deformities, tenderness or joint swelling or erythema. Scar to legs, smaller shoe on R.   Neurologically:  Mental status: The patient is awake, alert and oriented in all 4 spheres. Her immediate and remote memory, attention,  language skills and fund of knowledge are appropriate. There is no evidence of aphasia, agnosia, apraxia or anomia. Speech is clear with normal prosody and enunciation. Thought process is linear. Mood is normal and affect is normal.  Cranial nerves II - XII are as described above under HEENT exam. In addition: shoulder shrug is normal with equal shoulder height noted. Motor exam: Normal bulk, strength and tone is noted, with the exception of mild right leg weakness noted. There is no drift, tremor or rebound. Romberg is negative. Reflexes are 2+ throughout, trace R knee. Fine motor skills and coordination: intact with normal finger taps, normal hand movements, normal rapid alternating patting, normal foot taps and normal foot agility.  Cerebellar testing: No dysmetria or intention tremor on finger to nose testing. Heel to shin is unremarkable bilaterally. There is no truncal or gait ataxia.  Sensory exam: intact to light touch, pinprick, vibration, temperature sense in the upper and lower extremities.  Gait, station and balance: She stands with difficulty. No veering to one side is noted. No leaning to one side is noted. Posture is age-appropriate and stance is slightly wide-based. She walks with a slight limp on the left. She uses a single-point cane. Tandem walk is not possible.  Assessment and Plan:  In summary, KHALEELAH YOWELL is a very pleasant 70 y.o.-year old female with an underlying medical history of hypertension, smoking, chronic bronchitis, depression, anxiety, mitral valve prolapse, post polio, left-sided sciatica and obesity, whose history and physical exam are concerning for obstructive sleep apnea (OSA). I had a long chat with the patient about my findings and the diagnosis of OSA its prognosis and treatment options. We talked about medical treatments, surgical interventions and non-pharmacological approaches. I explained in particular the risks and ramifications of untreated moderate to  severe OSA, especially with respect to developing cardiovascular disease down the Road, including congestive heart failure, difficult to treat hypertension, cardiac arrhythmias, or stroke. Even type 2 diabetes has, in part, been linked to untreated OSA. Symptoms of untreated OSA include daytime sleepiness, memory problems, mood irritability and mood disorder such as depression and anxiety, lack of energy, as well as recurrent headaches, especially morning  headaches. We talked about smoking cessation and trying to maintain a healthy lifestyle in general, as well as the importance of weight control. I encouraged the patient to eat healthy, exercise daily and keep well hydrated, to keep a scheduled bedtime and wake time routine, to not skip any meals and eat healthy snacks in between meals. I advised the patient not to drive when feeling sleepy. She has been on Ambien for years. I am a little worried about her description of sleep related eating disorder. She has woken up in the morning without recollection of eating at night. She has eaten cereal and other foods at night. She attributes this to not eating well enough during the day. She does not describe any other more complex behavior such as leaving the home or driving without recollection. Nevertheless, it is concerning since she is mostly alone at night as her husband is away during the week for work. We talked about ways to improve sleep hygiene. Furthermore, she is advised to eat on a regular interval and keep a better schedule for her sleep and wake times, and drink more water. She is quite knowledgeable.  I recommended the following at this time: sleep study with potential positive airway pressure titration. (We will score hypopneas at 4% and split the sleep study into diagnostic and treatment portion, if the estimated. 2 hour AHI is >15/h).   I explained the sleep test procedure to the patient and also outlined possible surgical and non-surgical treatment  options of OSA, including the use of a custom-made dental device (which would require a referral to a specialist dentist or oral surgeon), upper airway surgical options, such as pillar implants, radiofrequency surgery, tongue base surgery, and UPPP (which would involve a referral to an ENT surgeon). Rarely, jaw surgery such as mandibular advancement may be considered.  I also explained the CPAP treatment option to the patient, who indicated that she would be willing to try CPAP if the need arises. I explained the importance of being compliant with PAP treatment, not only for insurance purposes but primarily to improve Her symptoms, and for the patient's long term health benefit, including to reduce Her cardiovascular risks. I answered all her questions today and the patient was in agreement. I would like to see her back after the sleep study is completed and encouraged her to call with any interim questions, concerns, problems or updates.   Thank you very much for allowing me to participate in the care of this nice patient. If I can be of any further assistance to you please do not hesitate to talk to me.  Sincerely,   Huston FoleySaima Lason Eveland, MD, PhD

## 2016-04-27 NOTE — Telephone Encounter (Signed)
Pt called requesting a refill of her Acetaminophen with Codene, she said she has 4 or 5 pills left. Her callback number is  [phone]

## 2016-04-27 NOTE — Patient Instructions (Addendum)
Based on your symptoms and your exam I believe you are at risk for obstructive sleep apnea or OSA, and I think we should proceed with a sleep study to determine whether you do or do not have OSA and how severe it is. If you have more than mild OSA, I want you to consider treatment with CPAP. Please remember, the risks and ramifications of moderate to severe obstructive sleep apnea or OSA are: Cardiovascular disease, including congestive heart failure, stroke, difficult to control hypertension, arrhythmias, and even type 2 diabetes has been linked to untreated OSA. Sleep apnea causes disruption of sleep and sleep deprivation in most cases, which, in turn, can cause recurrent headaches, problems with memory, mood, concentration, focus, and vigilance. Most people with untreated sleep apnea report excessive daytime sleepiness, which can affect their ability to drive. Please do not drive if you feel sleepy.   I will likely see you back after your sleep study to go over the test results and where to go from there. We will call you after your sleep study to advise about the results (most likely, you will hear from Lafonda Mossesiana, my nurse) and to set up an appointment at the time, as necessary.    Please bring your ambien.   Our sleep lab administrative assistant, Alvis LemmingsDawn will meet with you or call you to schedule your sleep study. If you don't hear back from her by next week please feel free to call her at [phone]. This is her direct line and please leave a message with your phone number to call back if you get the voicemail box. She will call back as soon as possible.

## 2016-04-28 MED ORDER — ACETAMINOPHEN-CODEINE #3 300-30 MG PO TABS: 1.0000 | ORAL_TABLET | Freq: Two times a day (BID) | ORAL | 0 refills | Status: DC

## 2016-04-28 NOTE — Telephone Encounter (Signed)
I called in script and left message for patient advising.

## 2016-05-20 ENCOUNTER — Ambulatory Visit (INDEPENDENT_AMBULATORY_CARE_PROVIDER_SITE_OTHER): Payer: Medicare PPO | Admitting: Neurology

## 2016-05-20 DIAGNOSIS — G4761 Periodic limb movement disorder: Secondary | ICD-10-CM | POA: Diagnosis not present

## 2016-05-20 DIAGNOSIS — G479 Sleep disorder, unspecified: Secondary | ICD-10-CM

## 2016-05-20 DIAGNOSIS — G4720 Circadian rhythm sleep disorder, unspecified type: Secondary | ICD-10-CM

## 2016-05-27 NOTE — Progress Notes (Signed)
Patient referred by Dr. Anne HahnWillis and MM, seen by me on 04/27/16, diagnostic PSG on 05/20/16.   Please call and notify the patient that the recent sleep study did not show any significant obstructive sleep apnea. She had severe leg twitching and poor sleep consolidation, no REM sleep.  Please inform patient that I would like to go over the details of the study during a follow up appointment. Arrange a followup appointment. Also, route or fax report to PCP and referring MD, if other than PCP.  Once you have spoken to patient, you can close this encounter.   Thanks,  Huston FoleySaima Adaia Matthies, MD, PhD [facility])

## 2016-05-27 NOTE — Procedures (Signed)
8PATIENT'S NAME:  [name], [name] DOB:      August 24, 1945      MR#:    [id number]     DATE OF RECORDING: 05/20/2016 REFERRING M.D.:  Elias Else MD Study Performed:   Baseline Polysomnogram HISTORY:  71 year old woman with a history of hypertension, smoking, chronic bronchitis, depression, anxiety, mitral valve prolapse, post polio, left-sided sciatica and obesity, who reports snoring and excessive daytime somnolence. The patient endorsed the Epworth Sleepiness Scale at 14 points. The patient's weight 183 pounds with a height of 62 (inches), resulting in a BMI of 33.7 kg/m2. The patient's neck circumference measured 17 inches.  CURRENT MEDICATIONS: Tylenol, Aspirin, Lipitor, Valium, Cymbalta, Neurontin, Hyzaar, Prilosec, Ambien   PROCEDURE:  This is a multichannel digital polysomnogram utilizing the Somnostar 11.2 system.  Electrodes and sensors were applied and monitored per AASM Specifications.   EEG, EOG, Chin and Limb EMG, were sampled at 200 Hz.  ECG, Snore and Nasal Pressure, Thermal Airflow, Respiratory Effort, CPAP Flow and Pressure, Oximetry was sampled at 50 Hz. Digital video and audio were recorded.      BASELINE STUDY  Lights Out was at 00:04 and Lights On at 08:01.  Total recording time (TRT) was 478 minutes, with a total sleep time (TST) of  293.5 minutes.   The patient's sleep latency was 102 minutes, which is delayed.  REM was absent.  The sleep efficiency was 61.4 %, which is reduced.     SLEEP ARCHITECTURE: WASO (Wake after sleep onset) was 104 minutes with severe sleep fragmentation noted.  There were 91 minutes in Stage N1, 185 minutes Stage N2, 17.5 minutes Stage N3 and 0 minutes in Stage REM.  The percentage of Stage N1 was 31.%, which is markedly increased, Stage N2 was 63.%, which is increased, Stage N3 was 6.%, which is reduced, and Stage R (REM sleep) was absent.   The arousals were noted as: 76 were spontaneous, 106 were associated with PLMs, 17 were associated with respiratory  events.  Audio and video analysis did not show any abnormal or unusual movements, behaviors, phonations or vocalizations, with the exception, that patient appeared restless and was noted to moan at times. She felt cold and requested extra blankets. The patient took 1 bathroom break. Mild intermittent snoring was noted.  The EKG was in keeping with normal sinus rhythm (NSR).  RESPIRATORY ANALYSIS:  There were a total of 13 respiratory events:  1 obstructive apneas, 0 central apneas and 0 mixed apneas with a total of 1 apneas and an apnea index (AI) of .2 /hour. There were 12 hypopneas with a hypopnea index of 2.5 /hour. The patient also had 7 respiratory event related arousals (RERAs).      The total APNEA/HYPOPNEA INDEX (AHI) was 2.7/hour and the total RESPIRATORY DISTURBANCE INDEX was 4.1 /hour.  0 events occurred in REM sleep and 24 events in NREM. The REM AHI was 0 /hour, versus a non-REM AHI of 2.7. The patient spent 23.5 minutes of total sleep time in the supine position and 270 minutes in non-supine.. The supine AHI was 7.7 versus a non-supine AHI of 2.2.  OXYGEN SATURATION & C02:  The Wake baseline 02 saturation was 94%, with the lowest being 81%. Time spent below 89% saturation equaled 136 minutes, but patient was noted to lay on the pulse oximeter and numbers are erroneous.  PERIODIC LIMB MOVEMENTS:   The patient had a total of 474 Periodic Limb Movements.  The Periodic Limb Movement (PLM) index was 96.9 and  the PLM Arousal index was 21.7/hour.  Post-study, the patient indicated that sleep was worse than usual.   IMPRESSION:  1. Periodic Limb Movement Disorder (PLMD) 2. Dysfunctions associated with sleep stages or arousal from sleep 3. Repetitive Intrusions of Sleep  RECOMMENDATIONS:  1. This study does not demonstrate any significant obstructive or central sleep disordered breathing and no significant snoring.  2. Severe PLMs (periodic limb movements of sleep) were noted during the  study with moderate arousals; clinical correlation is recommended. Medication effect from the patient's antidepressant medication should be considered.  3. The patient should be cautioned not to drive, work at heights, or operate dangerous or heavy equipment when tired or sleepy. Review and reiteration of good sleep hygiene measures should be pursued with any patient. 4. This study shows severe sleep fragmentation and abnormal sleep stage percentages, in particular absence of REM sleep; these are nonspecific findings and per se do not signify an intrinsic sleep disorder or a cause for the patient's sleep-related symptoms. Causes include (but are not limited to) the first night effect of the sleep study, circadian rhythm disturbances, medication effect or an underlying mood disorder or medical problem.  5. The patient will be [facility] discussion of the test results and further management strategies. The referring provider will be notified of the test results.  I certify that I have reviewed the entire raw data recording prior to the issuance of this report in accordance with the Standards of Accreditation of the American Academy of Sleep Medicine (AASM)    Colleen FoleySaima Waneda Klammer, MD, PhD Diplomat, American Board of Psychiatry and Neurology (Neurology and Sleep Medicine)

## 2016-05-30 ENCOUNTER — Telehealth: Payer: Self-pay

## 2016-05-30 NOTE — Telephone Encounter (Signed)
I spoke to patient and she is aware of results. I offered her an appt this week but she is not able to make it. Next available is in March. I told patient that I will keep an eye out for sooner opening.

## 2016-05-30 NOTE — Telephone Encounter (Signed)
-----   Message from Huston FoleySaima Athar, MD sent at 05/27/2016  1:56 PM EST ----- Patient referred by Dr. Anne HahnWillis and MM, seen by me on 04/27/16, diagnostic PSG on 05/20/16.   Please call and notify the patient that the recent sleep study did not show any significant obstructive sleep apnea. She had severe leg twitching and poor sleep consolidation, no REM sleep.  Please inform patient that I would like to go over the details of the study during a follow up appointment. Arrange a followup appointment. Also, route or fax report to PCP and referring MD, if other than PCP.  Once you have spoken to patient, you can close this encounter.   Thanks,  Huston FoleySaima Athar, MD, PhD [facility])

## 2016-06-02 NOTE — Telephone Encounter (Signed)
LM that we have an opening on Monday at 3pm as of now. I asked her to call back as soon as possible if she is interested.

## 2016-06-06 NOTE — Telephone Encounter (Signed)
I can talk to [name] about it, if she is available. We usually don't review video during visits.

## 2016-06-06 NOTE — Telephone Encounter (Signed)
I called patient back and she can come to appt on Wednesday. Patient requests to see some of the sleep study video. I am not sure how to arrange this? I will also let Robin know.

## 2016-06-07 NOTE — Telephone Encounter (Signed)
I spoke to [place]. She will not be here tomorrow but Zella BallRobin with show Dawn how to pull it up.

## 2016-06-08 ENCOUNTER — Ambulatory Visit: Payer: Self-pay | Admitting: Neurology

## 2016-06-10 ENCOUNTER — Telehealth: Payer: Self-pay

## 2016-06-10 NOTE — Telephone Encounter (Signed)
I called the patient, from home, and advised the pt that the office is closed and we will call back to reschedule. Patient voiced understanding.

## 2016-06-10 NOTE — Telephone Encounter (Signed)
I spoke to [place] and was able to get her rescheduled.

## 2016-06-10 NOTE — Telephone Encounter (Signed)
(  Due to the snow)

## 2016-07-05 ENCOUNTER — Ambulatory Visit (INDEPENDENT_AMBULATORY_CARE_PROVIDER_SITE_OTHER): Payer: Medicare HMO | Admitting: Neurology

## 2016-07-05 ENCOUNTER — Encounter: Payer: Self-pay | Admitting: Neurology

## 2016-07-05 VITALS — BP 136/83 | HR 87 | Resp 20 | Ht 62.0 in | Wt 180.0 lb

## 2016-07-05 DIAGNOSIS — M545 Low back pain, unspecified: Secondary | ICD-10-CM

## 2016-07-05 DIAGNOSIS — G4761 Periodic limb movement disorder: Secondary | ICD-10-CM

## 2016-07-05 DIAGNOSIS — G479 Sleep disorder, unspecified: Secondary | ICD-10-CM

## 2016-07-05 DIAGNOSIS — R918 Other nonspecific abnormal finding of lung field: Secondary | ICD-10-CM | POA: Diagnosis not present

## 2016-07-05 DIAGNOSIS — J439 Emphysema, unspecified: Secondary | ICD-10-CM | POA: Diagnosis not present

## 2016-07-05 DIAGNOSIS — G4759 Other parasomnia: Secondary | ICD-10-CM | POA: Diagnosis not present

## 2016-07-05 NOTE — Progress Notes (Signed)
Subjective:    Bennett ID: [name] is a 71 y.o. female.  HPI     Interim history:   [name] is a 71 year old right-handed woman with an underlying medical history of hypertension, smoking, chronic bronchitis, depression, anxiety, mitral valve prolapse, post polio, left-sided sciatica and obesity, who presents for follow-up consultation of [name] sleep disturbance, after [name] recent sleep study. [name] is unaccompanied today. [name] on 04/27/2016 at Colleen request of Dr. Jannifer Franklin in [place], at which time [name] reported snoring and excessive daytime somnolence. [name] reported restless sleep, [name] had leg surgeries. [name] reported no significant RLS symptoms but had tendon transfer in Colleen right leg and lower back surgeries twice.  Colleen invited [name] for sleep study. [name] had a baseline sleep study on 05/20/2016. Colleen talked [name] about [name] test results. Sleep efficiency was 65.4%, reduced, REM was absent, sleep latency delayed at 102 minutes. [name] had an increased percentage of stage II sleep. Slow-wave sleep was 6%, REM sleep was absent. [name] had one bathroom break. [name] had mild intermittent snoring. [name] had a total AHI of 2.7 per hour, supine AHI was 7.7 per hour. Average oxygen saturation was 94%, nadir was 81%. [name] did have some erroneous oxygen saturation findings. [name] had a PLM index of 96.9 per hour, PLM associated arousal index was 21.7 per hour. Unfortunately, we were not able to review a video of Colleen PSG at Colleen time of this appointment.   Today, 07/05/2016 (all dictated new, as well as above notes, some dictation done in note pad or Word, outside of chart, may appear as copied):  [name] reports [name] still does sleep well in that, stays groogy in Colleen evenings and not rested in Colleen AMs. [name] has been on Ambien for 7+ years, [name] has had instances of sleep related eating without next day recollection, such as cereal, but thankfully, not eating any non-edible items. [name] feels drugged and not well rested, in  pain from back pain, no RLS symptom. Between Colleen gabapentin and Cymbalta and Colleen Ambien [name] feels very groggy. Still smokes, about a ppd.  [name]'s allergies, current medications, family history, past medical history, past social history, past surgical history and problem list were reviewed and updated as appropriate.   Previously (copied from previous notes for reference):   04/27/2016: [name] reports snoring and excessive daytime somnolence. Colleen reviewed your office note from 03/30/2016. [name] has been on generic Ambien 10 mg each night for 7-8 years, per PCP. [name] has had some eating in Colleen middle of Colleen night, not remembering. Lives with husband, but he works outside Colleen home and is away during Colleen week; truck Geophysicist/field seismologist.  [name] has a dull mild HA occasionally in Colleen mornings. [name] has no nocturia, no RLS symptoms. [name] had leg surgeries. Has has tendon transfer on Colleen R leg. Lower back surgeries twice, as needed. Tylenol #3.  Has one daughter and one GD. 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 [name] does not drink alcohol. [name] quit smoking and then restarted smoking. [name] Epworth sleepiness score is 14 out of 24 today, [name] fatigue score is 48 out of 63. [name] admits that [name] does not hydrate well enough. [name] drinks about 16-20 ounces of soda per day, not enough water, maybe one 16 ounce bottle. [name] also does not eat on a regular basis. [name] does not keep a good sleep and wake schedule. [name] has Colleen TV on a timer at night. Bedtime may  vary between 9 PM and midnight and sometimes as late as early morning hours. Wakeup time is between 9:30 and 10 but typically is in bed until 11 or 11:30. [name] would be willing to try a CPAP machine. [name] knows that [name] could lose some weight and has worked on it off and on. [name] is a restless sleeper.   [name] Past Medical History Is Significant For: Past Medical History:  Diagnosis Date  . Anxiety   . Bronchitis due to tobacco use ([facility])    quit smoking 3 months ago  .  Chronic sciatica of left side 04/01/2015  . Depression   . GERD (gastroesophageal reflux disease)   . Hypertension   . Mitral valve prolapse   . Pneumonia    years ago  . Polio    age 36 years old    [name] Past Surgical History Is Significant For: Past Surgical History:  Procedure Laterality Date  . ABDOMINAL HYSTERECTOMY    . APPENDECTOMY    . COLONOSCOPY    . LUMBAR LAMINECTOMY Left 02/21/2014   Procedure: Left L5-S1 Hemilaminectomy, Lateral Recess Decompression, Removal Synovial Cyst;  Surgeon: Marybelle Killings, MD;  Location: [facility];  Service: Orthopedics;  Laterality: Left;  . polio Bilateral 1950's   surgery on feet and legs for polio  . TONSILLECTOMY      [name] Family History Is Significant For: Family History  Problem Relation Age of Onset  . Cancer Mother     thyroid  . Heart attack Father   . Cancer Sister     breast  . Kidney disease Sister     ESRD  . Cancer Sister     thyroid    [name] Social History Is Significant For: Social History   Social History  . Marital status: Married    Spouse name: Herbie Baltimore  . Number of children: 1  . Years of education: College   Occupational History  . Retired Other   Social History Main Topics  . Smoking status: Former Smoker    Packs/day: 1.50    Years: 50.00    Types: Cigarettes    Quit date: 10/21/2013  . Smokeless tobacco: Never Used  . Alcohol use 0.0 oz/week     Comment: occasional  . Drug use: No  . Sexual activity: Not Asked   Other Topics Concern  . None   Social History Narrative   Bennett lives at home with [name] spouse.   Caffeine Use: daily    [name] Allergies Are:  No Known Allergies:   [name] Current Medications Are:  Outpatient Encounter Prescriptions as of 07/05/2016  Medication Sig  . acetaminophen-codeine (TYLENOL #3) 300-30 MG tablet Take 1 tablet by mouth 2 (two) times daily. Prn pain  . Ascorbic Acid (VITAMIN C) 1000 MG tablet Take 1,000 mg by mouth daily.  Marland Kitchen aspirin EC 81 MG tablet Take 81 mg by  mouth daily.  Marland Kitchen atorvastatin (LIPITOR) 40 MG tablet Take 40 mg by mouth daily.  Marland Kitchen BIOTIN PO Take 1 tablet by mouth daily.   . diazepam (VALIUM) 5 MG tablet Take 1 tablet (5 mg total) by mouth every 12 (twelve) hours as needed for anxiety.  . DULoxetine (CYMBALTA) 60 MG capsule Take 60 mg by mouth daily.  Marland Kitchen gabapentin (NEURONTIN) 100 MG capsule One capsule in Colleen morning and two in Colleen evening  . losartan-hydrochlorothiazide (HYZAAR) 100-25 MG per tablet Take 1 tablet by mouth daily.  Marland Kitchen omeprazole (PRILOSEC) 20 MG capsule Take 20 mg by  mouth daily.  Marland Kitchen zolpidem (AMBIEN) 10 MG tablet Take 10 mg by mouth at bedtime as needed for sleep.  . [DISCONTINUED] cholecalciferol (VITAMIN D) 1000 units tablet Take 1,000 Units by mouth daily.   No facility-administered encounter medications on file as of 07/05/2016.   : Review of Systems:  Out of a complete 14 point review of systems, all are reviewed and negative with Colleen exception of these symptoms as listed below:  Review of Systems  Neurological:       Pt presents today to discuss [name] sleep study results. Pt wants to see Colleen video of [name] sleep study.    Objective:  Neurologic Exam  Physical Exam Physical Examination:   Vitals:   07/05/16 1325  BP: 136/83  Pulse: 87  Resp: 20    General Examination: [name] is a very pleasant 71 y.o. female in no acute distress. [name] appears well-developed and well-nourished and well groomed.   HEENT: Normocephalic, atraumatic, pupils are equal, round and reactive to light and accommodation. Funduscopic exam is normal with sharp disc margins noted. Extraocular tracking is good without limitation to gaze excursion or nystagmus noted. Normal smooth pursuit is noted. Hearing is grossly intact. Tympanic membranes are clear bilaterally. Face is symmetric with normal facial animation and normal facial sensation. Speech is clear with no dysarthria noted. There is no hypophonia. There is no lip, neck/head, jaw or  voice tremor. Neck is supple with full range of passive and active motion. There are no carotid bruits on auscultation. Oropharynx exam reveals: moderate mouth dryness, absence of teeth and moderate airway crowding. Mallampati is class III. Tongue protrudes centrally and palate elevates symmetrically. Tonsils are absent.   Chest: Clear to auscultation without wheezing, rhonchi or crackles noted.  Heart: S1+S2+0, regular and normal without murmurs, rubs or gallops noted.   Abdomen: Soft, non-tender and non-distended with normal bowel sounds appreciated on auscultation.  Extremities: No pain.   Skin: Warm and dry without trophic changes noted.  Musculoskeletal: exam reveals no obvious joint deformities, tenderness or joint swelling or erythema.   Neurologically:  Mental status: [name] is awake, alert and oriented in all 4 spheres. [name] immediate and remote memory, attention, language skills and fund of knowledge are appropriate. There is no evidence of aphasia, agnosia, apraxia or anomia. Speech is clear with normal prosody and enunciation. Thought process is linear. Mood is normal and affect is normal.  Cranial nerves II - XII are as described above under HEENT exam. In addition: shoulder shrug is normal with equal shoulder height noted. Motor exam: Normal bulk, strength and tone is noted except for stable appearing mild right lower leg weakness, reflexes 1-2+ throughout, absent in R knee. Fine motor skills and coordination: intact.  Cerebellar testing: No dysmetria or intention tremor.  Sensory exam: intact to light touch in Colleen upper and lower extremities.  Gait, station and balance: [name] stands with difficulty. Stance little wide-based. Mild limp, tandem walk is not possible.   Assessment and plan:    In summary, [name] is a very pleasant 71 y.o.-year old female with An underlying medical history of low back pain, hypertension, smoking, chronic bronchitis, history of depression  and anxiety, mitral valve prolapse, post polio,  who presents for follow-up consultation of [name] sleep disturbance, after [name] recent sleep study. [name] had a baseline sleep study in late December 2017. We discussed findings today. [name] had significant sleep fragmentation, some poor sleep efficiency, mild intermittent snoring and severe PLMS. [name] had  absence of REM sleep. This could in part be medication effect. [name] does not endorse restless leg syndrome type symptoms. [name] has residual back pain that bothers [name] at night. [name] does not like to be on sedating medications and would like to come off of Colleen Ambien. [name] has been on it for years, at least 7 years. [name] is advised to discuss this with [name] primary care provider and advised to pursue a slow taper. [name] had some parasomnias with Colleen Ambien. Of note, [name] mostly stays alone at night, [name] husband travels quite a bit, as he is a Administrator. [name] was started on gabapentin recently and felt too sedated on 300 mg at night, currently on 100 mg at night. [name] has also been on Cymbalta. We talked about good sleep hygiene, stating that [name] hydrated with water. [name] is furthermore again advised to quit smoking. [name] is encouraged to increase [name] water intake. From Colleen sleep standpoint, [name] did not have significant sleep disordered breathing. As [name] does not have restless leg symptoms [name] can be monitored for RLS and for [name] PLMS. [name] PLMS could be Colleen result of ongoing issues with pain at night and taking Cymbalta which can exacerbate PLMS. At this juncture, Colleen suggested as needed follow-up. Colleen answered all [name] questions today and [name] was in agreement.  Colleen spent 25 minutes in total face-to-face time with [name], more than 50% of which was spent in counseling and coordination of care, reviewing test results, reviewing medication and discussing or reviewing Colleen diagnosis of PLMD, sleep disturbance, parasomnias, Colleen prognosis and treatment options. Pertinent laboratory and imaging  test results that were available during this visit with [name] were reviewed by me and considered in my medical decision making (see chart for details).

## 2016-07-05 NOTE — Patient Instructions (Signed)
Please remember to try to maintain good sleep hygiene, which means: Keep a regular sleep and wake schedule, try not to exercise or have a meal within 2 hours of your bedtime, try to keep your bedroom conducive for sleep, that is, cool and dark, without light distractors such as an illuminated alarm clock, and refrain from watching TV right before sleep or in the middle of the night and do not keep the TV or radio on during the night. Also, try not to use or play on electronic devices at bedtime, such as your cell phone, tablet PC or laptop. If you like to read at bedtime on an electronic device, try to dim the background light as much as possible. Do not eat in the middle of the night.   Please quit smoking. Hydrate well with water.  Your sleep study did not show any significant obstructive sleep apnea, mild snoring was noted.  You had severe leg twitching in sleep, which did  disturb your sleep but could be due to residual back pain and medication effect. You can be monitored for restless legs symptoms.   Talk to Dr. Nicholos Johnseade about slowly tapering off the Ambien, it may take a few months; I would recommend very slow taper. It may help to temporarily increase the gabapentin while your tapering the Ambien.    Please remember to try to maintain good sleep hygiene, which means: Keep a regular sleep and wake schedule, try not to exercise or have a meal within 2 hours of your bedtime, try to keep your bedroom conducive for sleep, that is, cool and dark, without light distractors such as an illuminated alarm clock, and refrain from watching TV right before sleep or in the middle of the night and do not keep the TV or radio on during the night. Also, try not to use or play on electronic devices at bedtime, such as your cell phone, tablet PC or laptop. If you like to read at bedtime on an electronic device, try to dim the background light as much as possible. Do not eat in the middle of the night.   I will see you  back as needed. Keep your follow up with Dr. Nicholos Johnseade and Dr. Anne HahnWillis.

## 2016-07-11 DIAGNOSIS — N951 Menopausal and female climacteric states: Secondary | ICD-10-CM | POA: Diagnosis not present

## 2016-08-03 DIAGNOSIS — K219 Gastro-esophageal reflux disease without esophagitis: Secondary | ICD-10-CM | POA: Diagnosis not present

## 2016-08-03 DIAGNOSIS — G4700 Insomnia, unspecified: Secondary | ICD-10-CM | POA: Diagnosis not present

## 2016-08-03 DIAGNOSIS — N183 Chronic kidney disease, stage 3 (moderate): Secondary | ICD-10-CM | POA: Diagnosis not present

## 2016-08-03 DIAGNOSIS — R69 Illness, unspecified: Secondary | ICD-10-CM | POA: Diagnosis not present

## 2016-08-03 DIAGNOSIS — I10 Essential (primary) hypertension: Secondary | ICD-10-CM | POA: Diagnosis not present

## 2016-08-03 DIAGNOSIS — E7800 Pure hypercholesterolemia, unspecified: Secondary | ICD-10-CM | POA: Diagnosis not present

## 2016-08-05 ENCOUNTER — Telehealth (INDEPENDENT_AMBULATORY_CARE_PROVIDER_SITE_OTHER): Payer: Self-pay | Admitting: Orthopaedic Surgery

## 2016-08-05 NOTE — Telephone Encounter (Signed)
PT CALLED TO REQUEST REFILL ON PAIN MEDS, SHE REQUESTED A DIFFERENT KIND OTHER THEN THE LAST BECAUSE THEY DIDN'T HELP. [phone]

## 2016-08-05 NOTE — Telephone Encounter (Signed)
Schedule ROV

## 2016-08-05 NOTE — Telephone Encounter (Signed)
Can you please put patient on for 10:30am 3/19? Thanks. She already knows appt time.

## 2016-08-05 NOTE — Telephone Encounter (Signed)
Please advise. You had refilled her Tylenol #3 1po bid prn #20 on 04/28/2016 for bilateral sciatica. She is requesting something different.

## 2016-08-05 NOTE — Telephone Encounter (Signed)
DONE

## 2016-08-08 ENCOUNTER — Ambulatory Visit (INDEPENDENT_AMBULATORY_CARE_PROVIDER_SITE_OTHER): Payer: Medicare HMO | Admitting: Orthopaedic Surgery

## 2016-08-08 ENCOUNTER — Encounter (INDEPENDENT_AMBULATORY_CARE_PROVIDER_SITE_OTHER): Payer: Self-pay | Admitting: Orthopaedic Surgery

## 2016-08-08 VITALS — BP 127/73 | HR 71 | Ht 61.0 in | Wt 180.0 lb

## 2016-08-08 DIAGNOSIS — M5432 Sciatica, left side: Secondary | ICD-10-CM | POA: Diagnosis not present

## 2016-08-08 DIAGNOSIS — M7138 Other bursal cyst, other site: Secondary | ICD-10-CM | POA: Diagnosis not present

## 2016-08-08 DIAGNOSIS — M5431 Sciatica, right side: Secondary | ICD-10-CM

## 2016-08-08 MED ORDER — ACETAMINOPHEN-CODEINE #3 300-30 MG PO TABS: 1.0000 | ORAL_TABLET | Freq: Two times a day (BID) | ORAL | 0 refills | Status: DC

## 2016-08-08 NOTE — Progress Notes (Signed)
Office Visit Note   Patient: [name]           Date of Birth: 12/31/1945           MRN: [id number] Visit Date: 08/08/2016              Requested by: Elias Else, MD [phone] [place], [place] [address] PCP: Lolita Patella, MD   Assessment & Plan: Visit Diagnoses:  1. Synovial cyst of lumbar spine   2. Bilateral sciatica   3. Glass fragment right hyperthenar region removed today with sterile technique.  Plan: Offer her repeat MRI scan. She is only taking the Tylenol 3 occasionally. She doesn't want to proceed with MRI scan. I removed the glass fragment from the right hand using an 18-gauge needle sterile technique. Band-Aid applied. Tylenol 3 renewed 20 tablets. Return when necessary  Follow-Up Instructions: Return if symptoms worsen or fail to improve.   Orders:  No orders of the defined types were placed in this encounter.  Meds ordered this encounter  Medications  . acetaminophen-codeine (TYLENOL #3) 300-30 MG tablet    Sig: Take 1 tablet by mouth 2 (two) times daily. Prn pain    Dispense:  20 tablet    Refill:  0      Procedures: No procedures performed   Clinical Data: No additional findings.   Subjective: Chief Complaint  Patient presents with  . Lower Back - Pain    Patient is here for follow up low back pain. She had requested refill on pain medication that she takes occasionally if she has a really bad day. She had a 02/21/2014 left L5 hemilam, excision facet cyst, L5S1 microdiscectomy.  She states that the pain is not getting any worse. She does not have pain everyday, but when she has it, it is very bothersome. She was taking Tylenol #3.     Review of Systems 14 point review of systems updated and unchanged. Previous synovial cyst excision persistent back pain intermittently. Follow-up MRI postop showed no evidence recurrent cyst. She recently got her hand on some broken glass has a foreign body which looks more like a  splinter hypo-thenar region. Painful tender dark-colored. 1 mm in length.   Objective: Vital Signs: BP 127/73   Pulse 71   Ht 5\' 1"  (1.549 m)   Wt 180 lb (81.6 kg)   BMI 34.01 kg/m   Physical Exam  Constitutional: She is oriented to person, place, and time. She appears well-developed.  HENT:  Head: Normocephalic.  Right Ear: External ear normal.  Left Ear: External ear normal.  Eyes: Pupils are equal, round, and reactive to light.  Neck: No tracheal deviation present. No thyromegaly present.  Cardiovascular: Normal rate.   Pulmonary/Chest: Effort normal.  Abdominal: Soft.  Musculoskeletal:  Healed lumbar incision. Aseptic cleaning up the thenar region right hand using any 18-gauge needle splinter is removed Band-Aid applied. She can remove it later this evening wash her hand. Patient has normal heel toe gait negative straight leg raising no lymphadenopathy. No rash of her exposed skin.  Neurological: She is alert and oriented to person, place, and time.  Skin: Skin is warm and dry.  Psychiatric: She has a normal mood and affect. Her behavior is normal.    Ortho Exam  Specialty Comments:  No specialty comments available.  Imaging: No results found.   PMFS History: Patient Active Problem List   Diagnosis Date Noted  . Chronic sciatica of left side 04/01/2015  .  HNP (herniated nucleus pulposus), lumbar 02/21/2014  . Synovial cyst of lumbar spine 02/21/2014  . HNP (herniated nucleus pulposus) 02/21/2014   Past Medical History:  Diagnosis Date  . Anxiety   . Bronchitis due to tobacco use ([facility])    quit smoking 3 months ago  . Chronic sciatica of left side 04/01/2015  . Depression   . GERD (gastroesophageal reflux disease)   . Hypertension   . Mitral valve prolapse   . Pneumonia    years ago  . Polio    age 71 years old    Family History  Problem Relation Age of Onset  . Cancer Mother     thyroid  . Heart attack Father   . Cancer Sister     breast  . Kidney  disease Sister     ESRD  . Cancer Sister     thyroid    Past Surgical History:  Procedure Laterality Date  . ABDOMINAL HYSTERECTOMY    . APPENDECTOMY    . COLONOSCOPY    . LUMBAR LAMINECTOMY Left 02/21/2014   Procedure: Left L5-S1 Hemilaminectomy, Lateral Recess Decompression, Removal Synovial Cyst;  Surgeon: Eldred MangesMark C Yates, MD;  Location: [facility];  Service: Orthopedics;  Laterality: Left;  . polio Bilateral 1950's   surgery on feet and legs for polio  . TONSILLECTOMY     Social History   Occupational History  . Retired Other   Social History Main Topics  . Smoking status: Former Smoker    Packs/day: 1.50    Years: 50.00    Types: Cigarettes    Quit date: 10/21/2013  . Smokeless tobacco: Never Used  . Alcohol use 0.0 oz/week     Comment: occasional  . Drug use: No  . Sexual activity: Not on file

## 2016-09-14 DIAGNOSIS — I10 Essential (primary) hypertension: Secondary | ICD-10-CM | POA: Diagnosis not present

## 2016-09-14 DIAGNOSIS — R05 Cough: Secondary | ICD-10-CM | POA: Diagnosis not present

## 2016-09-19 ENCOUNTER — Telehealth (INDEPENDENT_AMBULATORY_CARE_PROVIDER_SITE_OTHER): Payer: Self-pay | Admitting: Orthopaedic Surgery

## 2016-09-19 DIAGNOSIS — M5432 Sciatica, left side: Secondary | ICD-10-CM

## 2016-09-19 DIAGNOSIS — M5431 Sciatica, right side: Secondary | ICD-10-CM

## 2016-09-19 MED ORDER — ACETAMINOPHEN-CODEINE #3 300-30 MG PO TABS: 1.0000 | ORAL_TABLET | Freq: Two times a day (BID) | ORAL | 0 refills | Status: DC

## 2016-09-19 NOTE — Telephone Encounter (Signed)
Please advise 

## 2016-09-19 NOTE — Addendum Note (Signed)
Addended by: Rogers Seeds on: 09/19/2016 05:35 PM   Modules accepted: Orders

## 2016-09-19 NOTE — Telephone Encounter (Signed)
OK refill Tylenol # 3 and OK for ESI thanks

## 2016-09-19 NOTE — Telephone Encounter (Signed)
Tylenol #3 called in and referral put in for ESI. I called patient and advised.

## 2016-09-19 NOTE — Telephone Encounter (Signed)
Pt request refill of pain meds please.  Also asked if you would recommend a spinal inj because she is still in pain.  [phone]

## 2016-09-28 ENCOUNTER — Encounter (INDEPENDENT_AMBULATORY_CARE_PROVIDER_SITE_OTHER): Payer: Medicare HMO | Admitting: Physical Medicine and Rehabilitation

## 2016-10-05 ENCOUNTER — Ambulatory Visit (INDEPENDENT_AMBULATORY_CARE_PROVIDER_SITE_OTHER): Payer: Medicare HMO | Admitting: Physical Medicine and Rehabilitation

## 2016-10-05 ENCOUNTER — Ambulatory Visit (INDEPENDENT_AMBULATORY_CARE_PROVIDER_SITE_OTHER): Payer: Medicare HMO

## 2016-10-05 ENCOUNTER — Encounter (INDEPENDENT_AMBULATORY_CARE_PROVIDER_SITE_OTHER): Payer: Self-pay | Admitting: Physical Medicine and Rehabilitation

## 2016-10-05 VITALS — BP 126/71 | HR 84

## 2016-10-05 DIAGNOSIS — M961 Postlaminectomy syndrome, not elsewhere classified: Secondary | ICD-10-CM | POA: Diagnosis not present

## 2016-10-05 DIAGNOSIS — M5416 Radiculopathy, lumbar region: Secondary | ICD-10-CM | POA: Diagnosis not present

## 2016-10-05 MED ORDER — LIDOCAINE HCL (PF) 1 % IJ SOLN
2.0000 mL | Freq: Once | INTRAMUSCULAR | Status: AC
  Administered 2016-10-05: 2 mL

## 2016-10-05 MED ORDER — METHYLPREDNISOLONE ACETATE 80 MG/ML IJ SUSP
80.0000 mg | Freq: Once | INTRAMUSCULAR | Status: AC
  Administered 2016-10-05: 80 mg

## 2016-10-05 NOTE — Procedures (Cosign Needed)
Lumbosacral Transforaminal Epidural Steroid Injection - Infraneural Approach with Fluoroscopic Guidance  Patient: Margarito LinerLinda S Sciulli      Date of Birth: 07/21/1945 MRN: [id number] PCP: Elias Elseeade, Robert, MD      Visit Date: 10/05/2016   Mrs. Tiburcio PeaHarris is a 71 year old female who comes in today with left hip and leg radicular pain. She saw Dr. Ophelia CharterYates in follow-up in March and then called back and requesting pain medication and he did refill this but he wonders try an epidural injection. She is status post both hemilaminectomy at L4-5 and L5-S1 for discectomy. The last MRI was in 2016 showing some scar tissue and residual narrowing. This is particularly at the L4-L5 level. This is consistent with her symptoms. We are going to complete a left L4 and L5 transforaminal epidural steroid injection. She also reports some right-sided complaints and right lower leg swelling in the foot. The swelling significantly unrelated to her spine problem. I will ask her to follow up with Dr. Ophelia CharterYates depending on the relief with the injection.  She ambulates with good strength bilaterally without clonus.  Universal Protocol:    Date/Time: 05/16/183:00 PM  Consent Given By: the patient  Position: PRONE   Additional Comments: Vital signs were monitored before and after the procedure. Patient was prepped and draped in the usual sterile fashion. The correct patient, procedure, and site was verified.   Injection Procedure Details:  Procedure Site One Meds Administered:  Meds ordered this encounter  Medications  . lidocaine (PF) (XYLOCAINE) 1 % injection 2 mL  . methylPREDNISolone acetate (DEPO-MEDROL) injection 80 mg      Laterality: Left  Location/Site:  L4-L5 L5-S1  Needle size: 22 G  Needle type: Spinal  Needle Placement: Transforaminal  Findings:  -Contrast Used: 1 mL iohexol 180 mg iodine/mL   -Comments: Excellent flow of contrast along the nerve and into the epidural space.  Procedure Details: After  squaring off the end-plates of the desired vertebral level to get a true AP view, the C-arm was obliqued to the painful side so that the superior articulating process is positioned about 1/3 the length of the inferior endplate.  The needle was aimed toward the junction of the superior articular process and the transverse process of the inferior vertebrae. The needle's initial entry is in the lower third of the foramen through Kambin's triangle. The soft tissues overlying this target were infiltrated with 2-3 ml. of 1% Lidocaine without Epinephrine.  The spinal needle was then inserted and advanced toward the target using a "trajectory" view along the fluoroscope beam.  Under AP and lateral visualization, the needle was advanced so it did not puncture dura and did not traverse medially beyond the 6 o'clock position of the pedicle. Bi-planar projections were used to confirm position. Aspiration was confirmed to be negative for CSF and/or blood. A 1-2 ml. volume of Isovue-250 was injected and flow of contrast was noted at each level. Radiographs were obtained for documentation purposes.   After attaining the desired flow of contrast documented above, a 0.5 to 1.0 ml test dose of 0.25% Marcaine was injected into each respective transforaminal space.  The patient was observed for 90 seconds post injection.  After no sensory deficits were reported, and normal lower extremity motor function was noted,   the above injectate was administered so that equal amounts of the injectate were placed at each foramen (level) into the transforaminal epidural space.   Additional Comments:  The patient tolerated the procedure well Dressing: Band-Aid  Post-procedure details: Patient was observed during the procedure. Post-procedure instructions were reviewed.  Patient left the clinic in stable condition.

## 2016-10-05 NOTE — Progress Notes (Deleted)
Pain mainly in left hip and thigh. Comes and goes. States it seems to be worse with laying. Having numbness, tingling, and burning into toes on both feet. Also having swelling in right lower leg into foot.

## 2016-10-05 NOTE — Patient Instructions (Signed)

## 2016-10-07 ENCOUNTER — Encounter (INDEPENDENT_AMBULATORY_CARE_PROVIDER_SITE_OTHER): Payer: Self-pay | Admitting: Physical Medicine and Rehabilitation

## 2016-10-31 DIAGNOSIS — G4700 Insomnia, unspecified: Secondary | ICD-10-CM | POA: Diagnosis not present

## 2016-10-31 DIAGNOSIS — R062 Wheezing: Secondary | ICD-10-CM | POA: Diagnosis not present

## 2016-10-31 DIAGNOSIS — B369 Superficial mycosis, unspecified: Secondary | ICD-10-CM | POA: Diagnosis not present

## 2016-12-07 DIAGNOSIS — M7989 Other specified soft tissue disorders: Secondary | ICD-10-CM | POA: Diagnosis not present

## 2016-12-07 DIAGNOSIS — R0602 Shortness of breath: Secondary | ICD-10-CM | POA: Diagnosis not present

## 2016-12-07 DIAGNOSIS — I10 Essential (primary) hypertension: Secondary | ICD-10-CM | POA: Diagnosis not present

## 2016-12-07 DIAGNOSIS — M549 Dorsalgia, unspecified: Secondary | ICD-10-CM | POA: Diagnosis not present

## 2016-12-07 DIAGNOSIS — R69 Illness, unspecified: Secondary | ICD-10-CM | POA: Diagnosis not present

## 2016-12-07 DIAGNOSIS — R1310 Dysphagia, unspecified: Secondary | ICD-10-CM | POA: Diagnosis not present

## 2016-12-07 DIAGNOSIS — K219 Gastro-esophageal reflux disease without esophagitis: Secondary | ICD-10-CM | POA: Diagnosis not present

## 2016-12-08 DIAGNOSIS — M7989 Other specified soft tissue disorders: Secondary | ICD-10-CM | POA: Diagnosis not present

## 2017-01-24 ENCOUNTER — Ambulatory Visit (INDEPENDENT_AMBULATORY_CARE_PROVIDER_SITE_OTHER): Payer: Medicare HMO | Admitting: Orthopaedic Surgery

## 2017-01-24 ENCOUNTER — Ambulatory Visit (INDEPENDENT_AMBULATORY_CARE_PROVIDER_SITE_OTHER): Payer: Medicare HMO

## 2017-01-24 ENCOUNTER — Encounter (INDEPENDENT_AMBULATORY_CARE_PROVIDER_SITE_OTHER): Payer: Self-pay | Admitting: Orthopaedic Surgery

## 2017-01-24 VITALS — BP 127/82 | HR 83 | Ht 61.0 in | Wt 180.0 lb

## 2017-01-24 DIAGNOSIS — M25572 Pain in left ankle and joints of left foot: Secondary | ICD-10-CM

## 2017-01-24 DIAGNOSIS — M79661 Pain in right lower leg: Secondary | ICD-10-CM

## 2017-01-24 DIAGNOSIS — M5431 Sciatica, right side: Secondary | ICD-10-CM | POA: Diagnosis not present

## 2017-01-24 DIAGNOSIS — M5432 Sciatica, left side: Secondary | ICD-10-CM

## 2017-01-24 MED ORDER — ACETAMINOPHEN-CODEINE #3 300-30 MG PO TABS: 1.0000 | ORAL_TABLET | Freq: Two times a day (BID) | ORAL | 0 refills | Status: DC

## 2017-01-24 NOTE — Progress Notes (Signed)
Office Visit Note   Patient: [name]           Date of Birth: 04/04/1946           MRN: [id number] Visit Date: 01/24/2017              Requested by: Elias Else, MD [phone] [place], [place] [address] PCP: Elias Else, MD   Assessment & Plan: Visit Diagnoses:  1. Pain in right lower leg   2. Pain in left ankle and joints of left foot     Plan: Lumbar disc degeneration good relief with recent epidural a few months ago. She has some lower extremity edema may be related to the Norvasc she can discuss this with her PCP at their next visit. Swelling is not severe enough to consider a sport stockings. She bought some over-the-counter that she can use. She can elevate her feet intermittently. Once radiculopathy on exam today.  Follow-Up Instructions: No Follow-up on file.   Orders:  Orders Placed This Encounter  Procedures  . XR Tibia/Fibula Right  . XR Ankle Complete Left   No orders of the defined types were placed in this encounter.     Procedures: No procedures performed   Clinical Data: No additional findings.   Subjective: Chief Complaint  Patient presents with  . Leg Pain    right lower extremity painful and swelling    HPI patient turned she states the epidural that she had in May gave her good relief. She's had problems with bilateral lower extremity swelling that didn't seem to start for couple months after the epidural. Her medications for blood pressure and switch used to be on a combination pill that had HCTZ in it. She's tried elevating her feet but that doesn't seem to really help. She is able to ambulate at times has some trouble getting shoes on correctly worse on the right than left foot no history of DVT and both lower extremities have been swelling to some degree. She was recently switched to Norvasc .  Review of Systems impervious systems updated. A few months ago she noticed that'll become with cough or congestion and had  previously been on Hyzaar. She had lumbar disc degeneration with previous 2 level decompression surgery. Good relief recently with an epidural.   Objective: Vital Signs: BP 127/82   Pulse 83   Ht 5\' 1"  (1.549 m)   Wt 180 lb (81.6 kg)   BMI 34.01 kg/m   Physical Exam  Constitutional: She is oriented to person, place, and time. She appears well-developed.  HENT:  Head: Normocephalic.  Right Ear: External ear normal.  Left Ear: External ear normal.  Eyes: Pupils are equal, round, and reactive to light.  Neck: No tracheal deviation present. No thyromegaly present.  Cardiovascular: Normal rate.   Pulmonary/Chest: Effort normal.  Abdominal: Soft.  Musculoskeletal:  Patient has multiple incisions from previous polio surgery. More so in the right than left lower extremity. Some crossover of her toes. She appears to have a tibial transfer on the right leg. Growth arrest scar on the left at the distal femur. Good hip range of motion negative straight leg raising mild edema both lower extremities. No venous stasis changes pulses are palpable.  Neurological: She is alert and oriented to person, place, and time.  Skin: Skin is warm and dry.  Psychiatric: She has a normal mood and affect. Her behavior is normal.    Ortho Exam  Specialty Comments:  No specialty comments available.  Imaging: No results found.   PMFS History: Patient Active Problem List   Diagnosis Date Noted  . Chronic sciatica of left side 04/01/2015  . HNP (herniated nucleus pulposus), lumbar 02/21/2014  . Synovial cyst of lumbar spine 02/21/2014  . HNP (herniated nucleus pulposus) 02/21/2014   Past Medical History:  Diagnosis Date  . Anxiety   . Bronchitis due to tobacco use (HCC)    quit smoking 3 months ago  . Chronic sciatica of left side 04/01/2015  . Depression   . GERD (gastroesophageal reflux disease)   . Hypertension   . Mitral valve prolapse   . Pneumonia    years ago  . Polio    age 71 years old      Family History  Problem Relation Age of Onset  . Cancer Mother        thyroid  . Heart attack Father   . Cancer Sister        breast  . Kidney disease Sister        ESRD  . Cancer Sister        thyroid    Past Surgical History:  Procedure Laterality Date  . ABDOMINAL HYSTERECTOMY    . APPENDECTOMY    . COLONOSCOPY    . LUMBAR LAMINECTOMY Left 02/21/2014   Procedure: Left L5-S1 Hemilaminectomy, Lateral Recess Decompression, Removal Synovial Cyst;  Surgeon: Eldred MangesMark C Earnstine Meinders, MD;  Location: [facility] OR;  Service: Orthopedics;  Laterality: Left;  . polio Bilateral 1950's   surgery on feet and legs for polio  . TONSILLECTOMY     Social History   Occupational History  . Retired Other   Social History Main Topics  . Smoking status: Former Smoker    Packs/day: 1.50    Years: 50.00    Types: Cigarettes    Quit date: 10/21/2013  . Smokeless tobacco: Never Used  . Alcohol use 0.0 oz/week     Comment: occasional  . Drug use: No  . Sexual activity: Not on file

## 2017-01-30 ENCOUNTER — Telehealth (INDEPENDENT_AMBULATORY_CARE_PROVIDER_SITE_OTHER): Payer: Self-pay | Admitting: Orthopaedic Surgery

## 2017-01-30 NOTE — Telephone Encounter (Signed)
Returned call to patient left message to call back    Need clarification on message left for Rx refill  [phone]

## 2017-01-31 ENCOUNTER — Telehealth (INDEPENDENT_AMBULATORY_CARE_PROVIDER_SITE_OTHER): Payer: Self-pay

## 2017-01-31 NOTE — Telephone Encounter (Signed)
Script called to pharmacy. [facility] [address] was in chart. No record of script being called in per pharmacy.  Patient advised.

## 2017-01-31 NOTE — Telephone Encounter (Signed)
Patient seen 1 week ago. She said that Dr Ophelia CharterYates was in process of getting acetaminophen rx for her but he could not get the printer to work. She said that he [facility] [address] But went to pick it up on Friday and it was not there. Please call patient back at [phone]

## 2017-03-13 DIAGNOSIS — R69 Illness, unspecified: Secondary | ICD-10-CM | POA: Diagnosis not present

## 2017-03-23 DIAGNOSIS — Z961 Presence of intraocular lens: Secondary | ICD-10-CM | POA: Diagnosis not present

## 2017-03-23 DIAGNOSIS — H5203 Hypermetropia, bilateral: Secondary | ICD-10-CM | POA: Diagnosis not present

## 2017-03-23 DIAGNOSIS — H524 Presbyopia: Secondary | ICD-10-CM | POA: Diagnosis not present

## 2017-03-28 DIAGNOSIS — R69 Illness, unspecified: Secondary | ICD-10-CM | POA: Diagnosis not present

## 2017-03-28 DIAGNOSIS — Z1231 Encounter for screening mammogram for malignant neoplasm of breast: Secondary | ICD-10-CM | POA: Diagnosis not present

## 2017-03-28 DIAGNOSIS — Z803 Family history of malignant neoplasm of breast: Secondary | ICD-10-CM | POA: Diagnosis not present

## 2017-04-10 DIAGNOSIS — R1310 Dysphagia, unspecified: Secondary | ICD-10-CM | POA: Diagnosis not present

## 2017-04-10 DIAGNOSIS — G4700 Insomnia, unspecified: Secondary | ICD-10-CM | POA: Diagnosis not present

## 2017-04-10 DIAGNOSIS — J449 Chronic obstructive pulmonary disease, unspecified: Secondary | ICD-10-CM | POA: Diagnosis not present

## 2017-04-10 DIAGNOSIS — Z716 Tobacco abuse counseling: Secondary | ICD-10-CM | POA: Diagnosis not present

## 2017-04-10 DIAGNOSIS — I10 Essential (primary) hypertension: Secondary | ICD-10-CM | POA: Diagnosis not present

## 2017-05-02 DIAGNOSIS — F17210 Nicotine dependence, cigarettes, uncomplicated: Secondary | ICD-10-CM | POA: Insufficient documentation

## 2017-05-02 DIAGNOSIS — R490 Dysphonia: Secondary | ICD-10-CM | POA: Insufficient documentation

## 2017-05-02 DIAGNOSIS — K219 Gastro-esophageal reflux disease without esophagitis: Secondary | ICD-10-CM | POA: Insufficient documentation

## 2017-05-02 DIAGNOSIS — R1314 Dysphagia, pharyngoesophageal phase: Secondary | ICD-10-CM | POA: Diagnosis not present

## 2017-05-08 ENCOUNTER — Other Ambulatory Visit: Payer: Self-pay | Admitting: Otolaryngology

## 2017-05-08 DIAGNOSIS — K219 Gastro-esophageal reflux disease without esophagitis: Secondary | ICD-10-CM

## 2017-05-08 DIAGNOSIS — R1314 Dysphagia, pharyngoesophageal phase: Secondary | ICD-10-CM

## 2017-05-08 DIAGNOSIS — F17210 Nicotine dependence, cigarettes, uncomplicated: Secondary | ICD-10-CM

## 2017-05-18 ENCOUNTER — Ambulatory Visit
Admission: RE | Admit: 2017-05-18 | Discharge: 2017-05-18 | Disposition: A | Discharge status: Home / Self Care | Payer: Self-pay | Source: Ambulatory Visit | Attending: Otolaryngology | Admitting: Otolaryngology

## 2017-05-18 ENCOUNTER — Other Ambulatory Visit: Payer: Self-pay

## 2017-05-18 DIAGNOSIS — R1314 Dysphagia, pharyngoesophageal phase: Secondary | ICD-10-CM

## 2017-05-18 DIAGNOSIS — R1310 Dysphagia, unspecified: Secondary | ICD-10-CM | POA: Diagnosis not present

## 2017-05-18 DIAGNOSIS — K219 Gastro-esophageal reflux disease without esophagitis: Secondary | ICD-10-CM

## 2017-05-18 DIAGNOSIS — F17210 Nicotine dependence, cigarettes, uncomplicated: Secondary | ICD-10-CM

## 2017-06-30 DIAGNOSIS — Z716 Tobacco abuse counseling: Secondary | ICD-10-CM | POA: Diagnosis not present

## 2017-06-30 DIAGNOSIS — Z0000 Encounter for general adult medical examination without abnormal findings: Secondary | ICD-10-CM | POA: Diagnosis not present

## 2017-06-30 DIAGNOSIS — G4700 Insomnia, unspecified: Secondary | ICD-10-CM | POA: Diagnosis not present

## 2017-06-30 DIAGNOSIS — K219 Gastro-esophageal reflux disease without esophagitis: Secondary | ICD-10-CM | POA: Diagnosis not present

## 2017-06-30 DIAGNOSIS — J449 Chronic obstructive pulmonary disease, unspecified: Secondary | ICD-10-CM | POA: Diagnosis not present

## 2017-06-30 DIAGNOSIS — F324 Major depressive disorder, single episode, in partial remission: Secondary | ICD-10-CM | POA: Diagnosis not present

## 2017-06-30 DIAGNOSIS — E7800 Pure hypercholesterolemia, unspecified: Secondary | ICD-10-CM | POA: Diagnosis not present

## 2017-06-30 DIAGNOSIS — B91 Sequelae of poliomyelitis: Secondary | ICD-10-CM | POA: Diagnosis not present

## 2017-07-11 ENCOUNTER — Telehealth (INDEPENDENT_AMBULATORY_CARE_PROVIDER_SITE_OTHER): Payer: Self-pay | Admitting: Orthopaedic Surgery

## 2017-07-11 DIAGNOSIS — M5431 Sciatica, right side: Secondary | ICD-10-CM

## 2017-07-11 DIAGNOSIS — M5432 Sciatica, left side: Secondary | ICD-10-CM

## 2017-07-11 MED ORDER — ACETAMINOPHEN-CODEINE #3 300-30 MG PO TABS: 1.0000 | ORAL_TABLET | Freq: Two times a day (BID) | ORAL | 0 refills | Status: DC

## 2017-07-11 NOTE — Telephone Encounter (Signed)
Ok to refill 

## 2017-07-11 NOTE — Addendum Note (Signed)
Addended by: Rogers SeedsYEATTS, Mikeal Winstanley M on: 07/11/2017 05:01 PM   Modules accepted: Orders

## 2017-07-11 NOTE — Telephone Encounter (Signed)
I called to pharmacy. I left voicemail for patient advising. 

## 2017-07-11 NOTE — Telephone Encounter (Signed)
Ok thanks 

## 2017-07-11 NOTE — Telephone Encounter (Signed)
Patient called asking for a refill on her Tylenol 3's. CB # [phone]

## 2017-07-18 DIAGNOSIS — E7800 Pure hypercholesterolemia, unspecified: Secondary | ICD-10-CM | POA: Diagnosis not present

## 2017-09-06 ENCOUNTER — Ambulatory Visit (INDEPENDENT_AMBULATORY_CARE_PROVIDER_SITE_OTHER): Payer: Medicare HMO | Admitting: Orthopaedic Surgery

## 2017-09-06 ENCOUNTER — Encounter (INDEPENDENT_AMBULATORY_CARE_PROVIDER_SITE_OTHER): Payer: Self-pay | Admitting: Orthopaedic Surgery

## 2017-09-06 ENCOUNTER — Ambulatory Visit (INDEPENDENT_AMBULATORY_CARE_PROVIDER_SITE_OTHER): Payer: Medicare HMO

## 2017-09-06 VITALS — BP 129/68 | HR 75

## 2017-09-06 DIAGNOSIS — M7712 Lateral epicondylitis, left elbow: Secondary | ICD-10-CM | POA: Diagnosis not present

## 2017-09-06 DIAGNOSIS — M25532 Pain in left wrist: Secondary | ICD-10-CM

## 2017-09-06 NOTE — Progress Notes (Signed)
Office Visit Note   Patient: [name]           Date of Birth: 26-May-1945           MRN: [id number] Visit Date: 09/06/2017              Requested by: Hoyt Koch, MD [address] [place], [place] [address] PCP: Hoyt Koch, MD   Assessment & Plan: Visit Diagnoses:  1. Pain in left wrist   2. Lateral epicondylitis, left elbow     Plan: Tennis elbow splint recommended.  Pathophysiology discussed.  She states she would like to purchase it at the pharmacy to decrease her cost.  Appropriate usage was discussed.  She will return if she has increased symptoms.  Follow-Up Instructions: Return if symptoms worsen or fail to improve.   Orders:  Orders Placed This Encounter  Procedures  . XR Wrist Complete Left   No orders of the defined types were placed in this encounter.     Procedures: No procedures performed   Clinical Data: No additional findings.   Subjective: Chief Complaint  Patient presents with  . Left Wrist - Pain    HPI 72-year-old female with 2 to 28-month history of left arm pain primarily at the elbow.  Pain tends to radiate down to her wrist.  She has some discomfort with certain movements squeezing gripping.  She has difficulty localizing it but has been applying BenGay heat and ice over the dorsal proximal forearm.  She denies associated cervical pain.  Past history of lumbar disc herniation with synovial cyst formation.  She denies night pain.  Increased pain if she uses her arm more.  No symptoms on the right side.  Review of Systems 14 point review of systems updated and unchanged from 01/24/2017 office visit other than as mentioned in HPI.  Negative for fever or chills.  No associated bowel or bladder symptoms.   Objective: Vital Signs: BP 129/68   Pulse 75   Physical Exam  Constitutional: She is oriented to person, place, and time. She appears well-developed.  HENT:  Head: Normocephalic.  Right Ear: External ear normal.  Left  Ear: External ear normal.  Eyes: Pupils are equal, round, and reactive to light.  Neck: No tracheal deviation present. No thyromegaly present.  Cardiovascular: Normal rate.  Pulmonary/Chest: Effort normal.  Abdominal: Soft.  Neurological: She is alert and oriented to person, place, and time.  Skin: Skin is warm and dry.  Psychiatric: She has a normal mood and affect. Her behavior is normal.    Ortho Exam patient has good cervical range of motion no supraclavicular lymphadenopathy.  No impingement of the shoulder long head of the biceps is normal.  Upper extremity reflexes are 2+.  She has reproduction of her pain with palpation over the lateral epicondyle of the left elbow.  Pain with gripping and with resisted wrist extension on the left negative on the right. Specialty Comments:  No specialty comments available.  Imaging: No results found.   PMFS History: Patient Active Problem List   Diagnosis Date Noted  . Chronic sciatica of left side 04/01/2015  . HNP (herniated nucleus pulposus), lumbar 02/21/2014  . Synovial cyst of lumbar spine 02/21/2014  . HNP (herniated nucleus pulposus) 02/21/2014   Past Medical History:  Diagnosis Date  . Anxiety   . Bronchitis due to tobacco use (HCC)    quit smoking 3 months ago  . Chronic sciatica of left side 04/01/2015  .  Depression   . GERD (gastroesophageal reflux disease)   . Hypertension   . Mitral valve prolapse   . Pneumonia    years ago  . Polio    age 72 years old    Family History  Problem Relation Age of Onset  . Cancer Mother        thyroid  . Heart attack Father   . Cancer Sister        breast  . Kidney disease Sister        ESRD  . Cancer Sister        thyroid    Past Surgical History:  Procedure Laterality Date  . ABDOMINAL HYSTERECTOMY    . APPENDECTOMY    . COLONOSCOPY    . LUMBAR LAMINECTOMY Left 02/21/2014   Procedure: Left L5-S1 Hemilaminectomy, Lateral Recess Decompression, Removal Synovial Cyst;   Surgeon: Eldred MangesMark C Madex Seals, MD;  Location: [facility] OR;  Service: Orthopedics;  Laterality: Left;  . polio Bilateral 1950's   surgery on feet and legs for polio  . TONSILLECTOMY     Social History   Occupational History  . Occupation: Retired    Associate Professormployer: [employer]  Tobacco Use  . Smoking status: Former Smoker    Packs/day: 1.50    Years: 50.00    Pack years: 75.00    Types: Cigarettes    Last attempt to quit: 10/21/2013    Years since quitting: 3.8  . Smokeless tobacco: Never Used  Substance and Sexual Activity  . Alcohol use: Yes    Alcohol/week: 0.0 oz    Comment: occasional  . Drug use: No  . Sexual activity: Not on file

## 2017-09-11 ENCOUNTER — Encounter (INDEPENDENT_AMBULATORY_CARE_PROVIDER_SITE_OTHER): Payer: Self-pay | Admitting: Orthopaedic Surgery

## 2017-09-21 DIAGNOSIS — G4700 Insomnia, unspecified: Secondary | ICD-10-CM | POA: Diagnosis not present

## 2017-09-21 DIAGNOSIS — E7800 Pure hypercholesterolemia, unspecified: Secondary | ICD-10-CM | POA: Diagnosis not present

## 2017-09-21 DIAGNOSIS — F419 Anxiety disorder, unspecified: Secondary | ICD-10-CM | POA: Diagnosis not present

## 2017-09-21 DIAGNOSIS — K219 Gastro-esophageal reflux disease without esophagitis: Secondary | ICD-10-CM | POA: Diagnosis not present

## 2017-09-21 DIAGNOSIS — R490 Dysphonia: Secondary | ICD-10-CM | POA: Diagnosis not present

## 2017-09-21 DIAGNOSIS — I10 Essential (primary) hypertension: Secondary | ICD-10-CM | POA: Diagnosis not present

## 2017-09-21 DIAGNOSIS — K1370 Unspecified lesions of oral mucosa: Secondary | ICD-10-CM | POA: Diagnosis not present

## 2017-09-21 DIAGNOSIS — J449 Chronic obstructive pulmonary disease, unspecified: Secondary | ICD-10-CM | POA: Diagnosis not present

## 2017-10-24 ENCOUNTER — Ambulatory Visit (INDEPENDENT_AMBULATORY_CARE_PROVIDER_SITE_OTHER): Payer: Medicare HMO

## 2017-10-24 ENCOUNTER — Ambulatory Visit (INDEPENDENT_AMBULATORY_CARE_PROVIDER_SITE_OTHER): Payer: Medicare HMO | Admitting: Orthopaedic Surgery

## 2017-10-24 ENCOUNTER — Encounter (INDEPENDENT_AMBULATORY_CARE_PROVIDER_SITE_OTHER): Payer: Self-pay | Admitting: Orthopaedic Surgery

## 2017-10-24 VITALS — BP 124/80 | HR 76 | Ht 60.0 in | Wt 170.0 lb

## 2017-10-24 DIAGNOSIS — M5432 Sciatica, left side: Secondary | ICD-10-CM | POA: Diagnosis not present

## 2017-10-24 DIAGNOSIS — G8929 Other chronic pain: Secondary | ICD-10-CM

## 2017-10-24 DIAGNOSIS — M25562 Pain in left knee: Secondary | ICD-10-CM | POA: Diagnosis not present

## 2017-10-24 DIAGNOSIS — M545 Low back pain: Secondary | ICD-10-CM

## 2017-10-24 DIAGNOSIS — M5431 Sciatica, right side: Secondary | ICD-10-CM | POA: Diagnosis not present

## 2017-10-24 MED ORDER — ACETAMINOPHEN-CODEINE #3 300-30 MG PO TABS: 1.0000 | ORAL_TABLET | Freq: Two times a day (BID) | ORAL | 0 refills | Status: DC

## 2017-10-24 NOTE — Progress Notes (Signed)
Office Visit Note   Patient: [name]           Date of Birth: 02/22/1946           MRN: [id number] Visit Date: 10/24/2017              Requested by: Hoyt KochYousef, Deema, MD [address] [place], [place] [id number] PCP: Hoyt KochYousef, Deema, MD   Assessment & Plan: Visit Diagnoses:  1. Chronic left-sided low back pain, with sciatica presence unspecified   2. Acute pain of left knee     Plan: Patient likely had a flare of disc bulge which she is had the left side at L4-5 and L5-S1.  Currently no radicular weakness.  She will continue to work on walking weight loss health maintenance.  Tylenol No. 3, 20 tablets prescribed no refills.  Return PRN.  Follow-Up Instructions: No follow-ups on file.   Orders:  Orders Placed This Encounter  Procedures  . XR Knee 1-2 Views Left  . XR Lumbar Spine 2-3 Views  . XR Pelvis 1-2 Views   No orders of the defined types were placed in this encounter.     Procedures: No procedures performed   Clinical Data: No additional findings.   Subjective: Chief Complaint  Patient presents with  . Left Hip - Pain  . Left Knee - Pain  . Lower Back - Pain    HPI 72 year old female returns she states she had an at the beginning of May when she had significant pain in her lateral hip and her knee and had trouble getting up and walking.  States she had to be in bed and could barely walk back and forth to the bathroom and gradually got better and she is been more active she had some Tylenol 3 and requesting a refill so she did have it available.  Past history of polio had epiphysiodesis has short femur on the left foot along her tibia on the right her leg lengths are close to equal but has 6 cm difference in her knee level with left knee joint being higher than right.  Healed medial lateral scar she did not have any knee effusion with associated problem.  She did have back pain associated with it.  No fever no groin pain with it.  Review of Systems  previous left L5-S1 hemilaminectomy lateral recess decompression removal of synovial cyst.  02/21/2014.Marland Kitchen. History of polio.  Left femoral distal epiphysiodesis growth arrest as a child.  14 point review of systems otherwise unchanged.   Objective: Vital Signs: BP 124/80   Pulse 76   Ht 5' (1.524 m)   Wt 170 lb (77.1 kg)   BMI 33.20 kg/m   Physical Exam  Constitutional: She is oriented to person, place, and time. She appears well-developed.  HENT:  Head: Normocephalic.  Right Ear: External ear normal.  Left Ear: External ear normal.  Eyes: Pupils are equal, round, and reactive to light.  Neck: No tracheal deviation present. No thyromegaly present.  Cardiovascular: Normal rate.  Pulmonary/Chest: Effort normal.  Abdominal: Soft.  Neurological: She is alert and oriented to person, place, and time.  Skin: Skin is warm and dry.  Psychiatric: She has a normal mood and affect. Her behavior is normal.    Ortho Exam well-healed medial lateral distal femoral incision from growth plate arrest as a child.  Normal hip range of motion.  Leg lengths are equal with level pelvis.  Left knee joint 6 cm higher than  the right.  Longer femur on the right longer tibia on the left.  Distal pulses are palpable negative logroll to the hips.  Mild sciatic notch tenderness bilateral trochanteric bursal tenderness.  Specialty Comments:  No specialty comments available.  Imaging: No results found.   PMFS History: Patient Active Problem List   Diagnosis Date Noted  . Chronic sciatica of left side 04/01/2015  . HNP (herniated nucleus pulposus), lumbar 02/21/2014  . Synovial cyst of lumbar spine 02/21/2014  . HNP (herniated nucleus pulposus) 02/21/2014   Past Medical History:  Diagnosis Date  . Anxiety   . Bronchitis due to tobacco use (HCC)    quit smoking 3 months ago  . Chronic sciatica of left side 04/01/2015  . Depression   . GERD (gastroesophageal reflux disease)   . Hypertension   . Mitral  valve prolapse   . Pneumonia    years ago  . Polio    age 76 years old    Family History  Problem Relation Age of Onset  . Cancer Mother        thyroid  . Heart attack Father   . Cancer Sister        breast  . Kidney disease Sister        ESRD  . Cancer Sister        thyroid    Past Surgical History:  Procedure Laterality Date  . ABDOMINAL HYSTERECTOMY    . APPENDECTOMY    . COLONOSCOPY    . LUMBAR LAMINECTOMY Left 02/21/2014   Procedure: Left L5-S1 Hemilaminectomy, Lateral Recess Decompression, Removal Synovial Cyst;  Surgeon: Eldred Manges, MD;  Location: [facility] OR;  Service: Orthopedics;  Laterality: Left;  . polio Bilateral 1950's   surgery on feet and legs for polio  . TONSILLECTOMY     Social History   Occupational History  . Occupation: Retired    Associate Professor: [employer]  Tobacco Use  . Smoking status: Former Smoker    Packs/day: 1.50    Years: 50.00    Pack years: 75.00    Types: Cigarettes    Last attempt to quit: 10/21/2013    Years since quitting: 4.0  . Smokeless tobacco: Never Used  Substance and Sexual Activity  . Alcohol use: Yes    Alcohol/week: 0.0 oz    Comment: occasional  . Drug use: No  . Sexual activity: Not on file

## 2017-11-21 DIAGNOSIS — J3489 Other specified disorders of nose and nasal sinuses: Secondary | ICD-10-CM | POA: Diagnosis not present

## 2017-12-04 DIAGNOSIS — H6123 Impacted cerumen, bilateral: Secondary | ICD-10-CM | POA: Diagnosis not present

## 2017-12-04 DIAGNOSIS — K219 Gastro-esophageal reflux disease without esophagitis: Secondary | ICD-10-CM | POA: Diagnosis not present

## 2017-12-04 DIAGNOSIS — F17210 Nicotine dependence, cigarettes, uncomplicated: Secondary | ICD-10-CM | POA: Diagnosis not present

## 2017-12-25 ENCOUNTER — Telehealth (INDEPENDENT_AMBULATORY_CARE_PROVIDER_SITE_OTHER): Payer: Self-pay | Admitting: Orthopaedic Surgery

## 2017-12-25 NOTE — Telephone Encounter (Signed)
Returned call to patient left message for a return call °

## 2017-12-26 DIAGNOSIS — R431 Parosmia: Secondary | ICD-10-CM | POA: Diagnosis not present

## 2017-12-26 DIAGNOSIS — Z8612 Personal history of poliomyelitis: Secondary | ICD-10-CM | POA: Diagnosis not present

## 2017-12-26 DIAGNOSIS — J449 Chronic obstructive pulmonary disease, unspecified: Secondary | ICD-10-CM | POA: Diagnosis not present

## 2017-12-26 DIAGNOSIS — G4700 Insomnia, unspecified: Secondary | ICD-10-CM | POA: Diagnosis not present

## 2017-12-26 DIAGNOSIS — F419 Anxiety disorder, unspecified: Secondary | ICD-10-CM | POA: Diagnosis not present

## 2017-12-26 DIAGNOSIS — K219 Gastro-esophageal reflux disease without esophagitis: Secondary | ICD-10-CM | POA: Diagnosis not present

## 2017-12-26 DIAGNOSIS — E7800 Pure hypercholesterolemia, unspecified: Secondary | ICD-10-CM | POA: Diagnosis not present

## 2017-12-26 DIAGNOSIS — I10 Essential (primary) hypertension: Secondary | ICD-10-CM | POA: Diagnosis not present

## 2018-02-01 ENCOUNTER — Telehealth (INDEPENDENT_AMBULATORY_CARE_PROVIDER_SITE_OTHER): Payer: Self-pay | Admitting: Orthopaedic Surgery

## 2018-02-01 DIAGNOSIS — M5431 Sciatica, right side: Secondary | ICD-10-CM

## 2018-02-01 DIAGNOSIS — M5432 Sciatica, left side: Secondary | ICD-10-CM

## 2018-02-01 MED ORDER — ACETAMINOPHEN-CODEINE #3 300-30 MG PO TABS: 1.0000 | ORAL_TABLET | Freq: Two times a day (BID) | ORAL | 0 refills | Status: DC

## 2018-02-01 NOTE — Telephone Encounter (Signed)
OK refill tylenol # 3.    Same as before. Best to not use hydrocodone.

## 2018-02-01 NOTE — Telephone Encounter (Signed)
Please advise. Last prescribed Tylenol #3 prn #20 on 10/24/17.

## 2018-02-01 NOTE — Telephone Encounter (Signed)
Patient called requesting an RX for pain.  She stated that Dr. Ophelia CharterYates usually prescribes her Hydrocodone.  CB#[phone].  Thank you.

## 2018-02-01 NOTE — Telephone Encounter (Signed)
[facility] [address] per patient request.

## 2018-02-07 ENCOUNTER — Telehealth (INDEPENDENT_AMBULATORY_CARE_PROVIDER_SITE_OTHER): Payer: Self-pay | Admitting: Orthopaedic Surgery

## 2018-02-07 NOTE — Telephone Encounter (Signed)
Patient called left voicemail message stating [facility] [address]. Patient [facility] the [facility] [address]. The number to contact patient is [phone]

## 2018-02-07 NOTE — Telephone Encounter (Signed)
I called [name] and advised.

## 2018-02-07 NOTE — Telephone Encounter (Signed)
[facility] [address] on 9/12 when patient requested.  I spoke with pharmacist there today who was filling medication.

## 2018-02-07 NOTE — Telephone Encounter (Signed)
Larita FifeLynn from [facility] on [address] called stating that they need to know if the patient is being treated for acute or chronic pain.  CB#[phone].  Thank you.

## 2018-02-14 ENCOUNTER — Encounter: Payer: Self-pay | Admitting: Gastroenterology

## 2018-02-22 ENCOUNTER — Ambulatory Visit: Payer: Medicare HMO | Admitting: Gastroenterology

## 2018-03-06 ENCOUNTER — Telehealth: Payer: Self-pay | Admitting: *Deleted

## 2018-03-06 ENCOUNTER — Encounter: Payer: Self-pay | Admitting: *Deleted

## 2018-03-06 DIAGNOSIS — Z87891 Personal history of nicotine dependence: Secondary | ICD-10-CM

## 2018-03-06 DIAGNOSIS — Z122 Encounter for screening for malignant neoplasm of respiratory organs: Secondary | ICD-10-CM

## 2018-03-06 NOTE — Telephone Encounter (Signed)
Received referral for initial lung cancer screening scan. Contacted patient and obtained smoking history,(current, 81 pack year) as well as answering questions related to screening process. Patient denies signs of lung cancer such as weight loss or hemoptysis. Patient denies comorbidity that would prevent curative treatment if lung cancer were found. Patient is scheduled for shared decision making visit and CT scan on 03/27/18 at 1415.

## 2018-03-07 ENCOUNTER — Encounter (INDEPENDENT_AMBULATORY_CARE_PROVIDER_SITE_OTHER): Payer: Self-pay

## 2018-03-07 ENCOUNTER — Ambulatory Visit: Payer: Medicare HMO | Admitting: Gastroenterology

## 2018-03-07 ENCOUNTER — Encounter: Payer: Self-pay | Admitting: Gastroenterology

## 2018-03-07 VITALS — BP 132/84 | HR 74 | Ht 61.0 in | Wt 184.0 lb

## 2018-03-07 DIAGNOSIS — K219 Gastro-esophageal reflux disease without esophagitis: Secondary | ICD-10-CM

## 2018-03-07 DIAGNOSIS — R490 Dysphonia: Secondary | ICD-10-CM | POA: Diagnosis not present

## 2018-03-07 DIAGNOSIS — R1310 Dysphagia, unspecified: Secondary | ICD-10-CM

## 2018-03-07 DIAGNOSIS — K449 Diaphragmatic hernia without obstruction or gangrene: Secondary | ICD-10-CM | POA: Diagnosis not present

## 2018-03-07 DIAGNOSIS — F17200 Nicotine dependence, unspecified, uncomplicated: Secondary | ICD-10-CM | POA: Diagnosis not present

## 2018-03-07 NOTE — Patient Instructions (Signed)
If you are age 73 or older, your body mass index should be between 23-30. Your Body mass index is 34.77 kg/m. If this is out of the aforementioned range listed, please consider follow up with your Primary Care Provider.  If you are age 89 or younger, your body mass index should be between 19-25. Your Body mass index is 34.77 kg/m. If this is out of the aformentioned range listed, please consider follow up with your Primary Care Provider.   Please call us back to schedule your EGD with Dilatation when you have your schedule.  It was a pleasure to see you today!  Vito Cirigliano, D.O.

## 2018-03-07 NOTE — Progress Notes (Signed)
Chief Complaint: Dysphagia, hoarseness   Referring Provider:     Dr. Pollyann Kennedy (ENT)    HPI:     [name] is a 72 y.o. female referred to the [facility] for evaluation of dysphagia and hoarseness.   She describes a long-standing history of solid food dysphagia described as food catching in her throat, pointing to her anterior neck and suprasternal notch. Has been ongoing for years. No associated weight loss (has gained weight over the years). No early satiety.  No history of food impactions.  Just returned from vacation where she ate without issue. Has had EGD with [place] in 1989 and 1990 (see below) and thinks another with and outside GI since then, but no prior dilations.   Hx of reflux sxs of HB, which is well controlled with omeprazole 20 mg daily. No breakthrough sxs. Hoarseness has been present for >1 year.   Drinks 36 oz Coke/day and smokes 2 PPD. Hx of b/l cerumen impaction in July s/p removal by Dr. Pollyann Kennedy (ENT).  No prior laryngoscopy per patient.  Last colonoscopy was a few years ago at outside facility. No current LGI sxs.   Endoscopic/GI history: -EGD (1990, Dr. Kinnie Scales): Normal -Colonoscopy (1995, Dr. Kinnie Scales): Normal -EGD (1989, Dr. Kinnie Scales): Small benign esophageal polyp, hiatal hernia, otherwise normal -Barium esophagogram (2018): Nonspecific esophageal motility disorder, small sliding-type hiatal hernia, no GE reflux, 13 mm barium pill passed into stomach without difficulty.  Past Medical History:  Diagnosis Date  . Anxiety   . Bronchitis due to tobacco use    quit smoking 3 months ago  . Chronic sciatica of left side 04/01/2015  . COPD (chronic obstructive pulmonary disease) (HCC)   . Depression   . GERD (gastroesophageal reflux disease)   . Hypertension   . Mitral valve prolapse   . Pneumonia    years ago  . Polio    age 47 years old     Past Surgical History:  Procedure Laterality Date  . ABDOMINAL HYSTERECTOMY  1985  .  APPENDECTOMY    . COLONOSCOPY  2017  . LUMBAR LAMINECTOMY Left 02/21/2014   Procedure: Left L5-S1 Hemilaminectomy, Lateral Recess Decompression, Removal Synovial Cyst;  Surgeon: Eldred Manges, MD;  Location: [facility] OR;  Service: Orthopedics;  Laterality: Left;  . polio Bilateral 1950's   surgery on feet and legs for polio  . TONSILLECTOMY     Family History  Problem Relation Age of Onset  . Cancer Mother        thyroid  . Heart attack Father   . Cancer Sister        breast  . Kidney disease Sister        ESRD  . Irritable bowel syndrome Sister   . Cancer Sister        thyroid  . Colon cancer Neg Hx   . Esophageal cancer Neg Hx    Social History   Tobacco Use  . Smoking status: Current Every Day Smoker    Packs/day: 1.50    Years: 54.00    Pack years: 81.00    Types: Cigarettes  . Smokeless tobacco: Never Used  . Tobacco comment: currently 2 packs per day  Substance Use Topics  . Alcohol use: Yes    Alcohol/week: 0.0 standard drinks    Comment: occasional  . Drug use: No   Current Outpatient Medications  Medication Sig Dispense Refill  . acetaminophen-codeine (TYLENOL #3)  300-30 MG tablet Take 1 tablet by mouth 2 (two) times daily. Prn pain 20 tablet 0  . AMBULATORY NON FORMULARY MEDICATION 1 tablet. Hyland's leg cramp PRN    . AMBULATORY NON FORMULARY MEDICATION 1 tablet. Mane for hair    . atorvastatin (LIPITOR) 40 MG tablet Take 40 mg by mouth daily.    . benzonatate (TESSALON) 100 MG capsule Take 100 mg by mouth daily.    . diazepam (VALIUM) 5 MG tablet Take 1 tablet (5 mg total) by mouth every 12 (twelve) hours as needed for anxiety. 10 tablet 0  . DULoxetine (CYMBALTA) 60 MG capsule Take 60 mg by mouth daily.    Marland Kitchen gabapentin (NEURONTIN) 300 MG capsule Take 300 mg by mouth as needed.    . hydrochlorothiazide (HYDRODIURIL) 25 MG tablet 25 mg daily.     Marland Kitchen omeprazole (PRILOSEC) 20 MG capsule Take 20 mg by mouth daily.    Marland Kitchen zolpidem (AMBIEN) 5 MG tablet Take 5 mg by mouth  at bedtime as needed for sleep.     No current facility-administered medications for this visit.    No Known Allergies   Review of Systems: All systems reviewed and negative except where noted in HPI.     Physical Exam:    Wt Readings from Last 3 Encounters:  03/07/18 184 lb (83.5 kg)  10/24/17 170 lb (77.1 kg)  01/24/17 180 lb (81.6 kg)    BP 132/84   Pulse 74   Ht 5\' 1"  (1.549 m)   Wt 184 lb (83.5 kg)   BMI 34.77 kg/m  Constitutional:  Pleasant, in no acute distress. Psychiatric: Normal mood and affect. Behavior is normal. EENT: Pupils normal.  Conjunctivae are normal. No scleral icterus. Neck supple. No cervical LAD. Cardiovascular: Normal rate, regular rhythm. No edema Pulmonary/chest: Effort normal and breath sounds normal. No wheezing, rales or rhonchi. Abdominal: Soft, nondistended, nontender. Bowel sounds active throughout. There are no masses palpable. No hepatomegaly. Neurological: Alert and oriented to person place and time. Skin: Skin is warm and dry. No rashes noted.   ASSESSMENT AND PLAN;   [name] is a 72 y.o. female presenting with:   1) Dysphagia: - EGD with dilation - If EGD unrevealing, will refer for esophageal manometry. - Barium esophagogram with hiatal hernia and nonspecific esophageal motility disorder - Advised patient to cut food into small pieces, eat small bites, chew food thoroughly and with plenty of liquids to avoid food impaction. - Resume PPI at current dose for now  2) Hoarseness: Discussed potential GI etiology for hoarseness, to include uncontrolled (but silent) reflux.  Will evaluate for evidence of ongoing reflux (i.e. erosive esophagitis) at time of EGD as above.  If not present, can consider adding pH/impedance testing (on PPI) at time of esophageal manometry.  Otherwise discussed her smoking at length as potential cause.  She already follows with ENT, so if above studies unrevealing, may need to consider  laryngoscopy  3) Reflux: Her typical reflux symptoms (heartburn) seem well controlled with daily omeprazole.  No change in medical management at this time and will evaluate for objective evidence of ongoing reflux as above.  4) Tobacco use disorder: Discussed smoking cessation with patient.  One of your biggest health concerns is your smoking.  This increases your risk for most cancers and serious cardiovascular diseases such as strokes, heart attacks.  You should try your best to stop.  If you need assistance, please contact your PCP or Smoking Cessation Class at [facility] (  [phone]) or [facility] Quit-Line (1-800-QUIT-NOW).  5) Hiatal hernia: Barium study in 2018 noted hiatal hernia.  Will evaluate for size of hernia at time of EGD as above.  The indications, risks, and benefits of EGD were explained to the patient in detail. Risks include but are not limited to bleeding, perforation, adverse reaction to medications, and cardiopulmonary compromise. Sequelae include but are not limited to the possibility of surgery, hositalization, and mortality. The patient verbalized understanding and wished to proceed. All questions answered, referred to scheduler. Further recommendations pending results of the exam.    Verlin Dike Muhannad Bignell, DO, FACG  03/07/2018, 2:23 PM   No ref. provider found

## 2018-03-09 ENCOUNTER — Telehealth: Payer: Self-pay | Admitting: Gastroenterology

## 2018-03-09 DIAGNOSIS — R1310 Dysphagia, unspecified: Secondary | ICD-10-CM

## 2018-03-09 DIAGNOSIS — K449 Diaphragmatic hernia without obstruction or gangrene: Secondary | ICD-10-CM

## 2018-03-09 DIAGNOSIS — R490 Dysphonia: Secondary | ICD-10-CM

## 2018-03-09 DIAGNOSIS — F17200 Nicotine dependence, unspecified, uncomplicated: Secondary | ICD-10-CM

## 2018-03-09 DIAGNOSIS — K219 Gastro-esophageal reflux disease without esophagitis: Secondary | ICD-10-CM

## 2018-03-09 NOTE — Telephone Encounter (Signed)
Pt called to inform you that  She has availability on Monday afternoon or Tuesday morning but she needs to know about it today so she can arrange transportation.

## 2018-03-09 NOTE — Telephone Encounter (Signed)
Spoke to patient scheduled EGD with Dilatation for 03/13/2018 at 11:30am. Patient will come by the office on Monday to go over instructions and sign the patient acknowledgment.

## 2018-03-12 ENCOUNTER — Encounter: Payer: Self-pay | Admitting: Gastroenterology

## 2018-03-12 ENCOUNTER — Other Ambulatory Visit: Payer: Self-pay

## 2018-03-12 DIAGNOSIS — R490 Dysphonia: Secondary | ICD-10-CM

## 2018-03-12 DIAGNOSIS — K219 Gastro-esophageal reflux disease without esophagitis: Secondary | ICD-10-CM

## 2018-03-12 DIAGNOSIS — K449 Diaphragmatic hernia without obstruction or gangrene: Secondary | ICD-10-CM

## 2018-03-12 DIAGNOSIS — R1310 Dysphagia, unspecified: Secondary | ICD-10-CM

## 2018-03-12 DIAGNOSIS — F17200 Nicotine dependence, unspecified, uncomplicated: Secondary | ICD-10-CM

## 2018-03-13 ENCOUNTER — Ambulatory Visit: Payer: Medicare HMO | Admitting: Gastroenterology

## 2018-03-13 ENCOUNTER — Encounter: Payer: Self-pay | Admitting: Gastroenterology

## 2018-03-13 VITALS — BP 139/70 | HR 69 | Temp 97.8°F | Ht 61.0 in | Wt 184.0 lb

## 2018-03-13 MED ORDER — SODIUM CHLORIDE 0.9 % IV SOLN: 500.0000 mL | Freq: Once | INTRAVENOUS | Status: DC

## 2018-03-13 NOTE — Progress Notes (Signed)
Patient drank at 1015. She was going to be postponed to 1:00 later today but decided to reschedule because her care partner has a meeting this afternoon. Patient was given Dr. Frankey Shown office number and will call to  reschedule when she checks her appointment schedule at home. Sm

## 2018-03-26 ENCOUNTER — Telehealth: Payer: Self-pay | Admitting: *Deleted

## 2018-03-26 ENCOUNTER — Telehealth: Payer: Self-pay | Admitting: Gastroenterology

## 2018-03-26 NOTE — Telephone Encounter (Signed)
The patients pre cert is in her chart on under the Letters tab. Completed on 03/12/2018. I will call the patient to notify her of what the insurance company said. And she should have gotten a letter in the mail with the same information.

## 2018-03-26 NOTE — Telephone Encounter (Signed)
Pt stated that she spoke with her insurance about her procedure scheduled on Wednesday and insurance told her that they have not received PA request from [place].

## 2018-03-26 NOTE — Telephone Encounter (Signed)
Called pt to remind of appt for ldct screening tomorrow 03-27-18@1415 , voiced understanding.

## 2018-03-27 ENCOUNTER — Inpatient Hospital Stay: Payer: Medicare HMO | Attending: Nurse Practitioner | Admitting: Nurse Practitioner

## 2018-03-27 ENCOUNTER — Inpatient Hospital Stay: Admission: RE | Admit: 2018-03-27 | Payer: Medicare HMO | Source: Ambulatory Visit

## 2018-03-27 ENCOUNTER — Ambulatory Visit
Admission: RE | Admit: 2018-03-27 | Discharge: 2018-03-27 | Disposition: A | Discharge status: Home / Self Care | Payer: Medicare HMO | Source: Ambulatory Visit | Attending: Nurse Practitioner | Admitting: Nurse Practitioner

## 2018-03-27 DIAGNOSIS — J439 Emphysema, unspecified: Secondary | ICD-10-CM | POA: Diagnosis not present

## 2018-03-27 DIAGNOSIS — Z122 Encounter for screening for malignant neoplasm of respiratory organs: Secondary | ICD-10-CM

## 2018-03-27 DIAGNOSIS — Z87891 Personal history of nicotine dependence: Secondary | ICD-10-CM | POA: Insufficient documentation

## 2018-03-27 DIAGNOSIS — I700 Atherosclerosis of aorta: Secondary | ICD-10-CM | POA: Diagnosis not present

## 2018-03-27 NOTE — Progress Notes (Signed)
In accordance with CMS guidelines, patient has met eligibility criteria including age, absence of signs or symptoms of lung cancer.  Social History   Tobacco Use  . Smoking status: Current Every Day Smoker    Packs/day: 1.50    Years: 54.00    Pack years: 81.00    Types: Cigarettes  . Smokeless tobacco: Never Used  . Tobacco comment: currently 2 packs per day  Substance Use Topics  . Alcohol use: Yes    Alcohol/week: 0.0 standard drinks    Comment: occasional  . Drug use: No      A shared decision-making session was conducted prior to the performance of CT scan. This includes one or more decision aids, includes benefits and harms of screening, follow-up diagnostic testing, over-diagnosis, false positive rate, and total radiation exposure.   Counseling on the importance of adherence to annual lung cancer LDCT screening, impact of co-morbidities, and ability or willingness to undergo diagnosis and treatment is imperative for compliance of the program.   Counseling on the importance of continued smoking cessation for former smokers; the importance of smoking cessation for current smokers, and information about tobacco cessation interventions have been given to patient including [place] and [address] quit [place] programs.   Written order for lung cancer screening with LDCT has been given to the patient and any and all questions have been answered to the best of my abilities.    Yearly follow up will be coordinated by Burgess Estelle, Thoracic Navigator.  Beckey Rutter, DNP, AGNP-C [place] at [facility] [phone] (work cell) [phone] (office) 03/27/18 4:51 PM

## 2018-03-28 ENCOUNTER — Encounter: Payer: Self-pay | Admitting: Gastroenterology

## 2018-03-28 ENCOUNTER — Ambulatory Visit (AMBULATORY_SURGERY_CENTER): Payer: Medicare HMO | Admitting: Gastroenterology

## 2018-03-28 ENCOUNTER — Telehealth: Payer: Self-pay | Admitting: Gastroenterology

## 2018-03-28 ENCOUNTER — Telehealth: Payer: Self-pay | Admitting: *Deleted

## 2018-03-28 VITALS — BP 135/77 | HR 66 | Temp 97.5°F | Resp 18 | Ht 61.0 in | Wt 184.0 lb

## 2018-03-28 DIAGNOSIS — K2950 Unspecified chronic gastritis without bleeding: Secondary | ICD-10-CM | POA: Diagnosis not present

## 2018-03-28 DIAGNOSIS — K222 Esophageal obstruction: Secondary | ICD-10-CM

## 2018-03-28 DIAGNOSIS — K219 Gastro-esophageal reflux disease without esophagitis: Secondary | ICD-10-CM | POA: Diagnosis not present

## 2018-03-28 DIAGNOSIS — K2970 Gastritis, unspecified, without bleeding: Secondary | ICD-10-CM | POA: Diagnosis not present

## 2018-03-28 DIAGNOSIS — R1310 Dysphagia, unspecified: Secondary | ICD-10-CM | POA: Diagnosis not present

## 2018-03-28 MED ORDER — SODIUM CHLORIDE 0.9 % IV SOLN: 500.0000 mL | Freq: Once | INTRAVENOUS | Status: AC

## 2018-03-28 MED ORDER — SUCRALFATE 1 GM/10ML PO SUSP: ORAL | 1 refills | Status: DC

## 2018-03-28 NOTE — Telephone Encounter (Signed)
[facility] calling regarding prescription for sulcrafate. They need to clarification, pls call pharmacy.

## 2018-03-28 NOTE — Op Note (Signed)
[facility] Patient Name: [name] Procedure Date: 03/28/2018 9:01 AM MRN: [id number] Endoscopist: Doristine Locks , MD Age: 72 Referring MD:  Date of Birth: 10-24-1945 Gender: Female Account #: [id number] Procedure:                Upper GI endoscopy Indications:              Dysphagia, Esophageal reflux Medicines:                Monitored Anesthesia Care Procedure:                Pre-Anesthesia Assessment:                           - Prior to the procedure, a History and Physical                            was performed, and patient medications and                            allergies were reviewed. The patient's tolerance of                            previous anesthesia was also reviewed. The risks                            and benefits of the procedure and the sedation                            options and risks were discussed with the patient.                            All questions were answered, and informed consent                            was obtained. Prior Anticoagulants: The patient has                            taken no previous anticoagulant or antiplatelet                            agents. ASA Grade Assessment: II - A patient with                            mild systemic disease. After reviewing the risks                            and benefits, the patient was deemed in                            satisfactory condition to undergo the procedure.                           After obtaining informed consent, the endoscope was  passed under direct vision. Throughout the                            procedure, the patient's blood pressure, pulse, and                            oxygen saturations were monitored continuously. The                            Model GIF-HQ190 [id number]) scope was introduced                            through the mouth, and advanced to the second part                            of duodenum. The upper GI  endoscopy was                            accomplished without difficulty. The patient                            tolerated the procedure well. Scope In: Scope Out: Findings:                 The examined esophagus was normal. The scope was                            withdrawn. Dilation was performed with a Maloney                            dilator with mild resistance at 54 Fr. The dilation                            site was examined following endoscope reinsertion                            and showed mild mucosal disruption with an                            appropriate mucosal rent located 15 cm from the                            incisors, just distal to the UES, consistent with                            successful dilation. Estimated blood loss was                            minimal.                           The Z-line was regular and was found 38 cm from the  incisors.                           The entire examined stomach was normal. Biopsies                            were taken with a cold forceps for Helicobacter                            pylori testing. Estimated blood loss was minimal.                           The duodenal bulb, first portion of the duodenum                            and second portion of the duodenum were normal. Complications:            No immediate complications. Estimated Blood Loss:     Estimated blood loss was minimal. Impression:               - Normal esophagus. Dilated with a 54 Fr Maloney                            dilator with appropriate mucosal rent just distal                            to the UES, located 15 cm from the incisors,                            consistent with successful dilation. The location                            of the mucosal rent corresponds well with the                            location of her primary symptoms.                           - Z-line regular, 38 cm from the incisors.                            - Normal stomach. Biopsied.                           - Normal duodenal bulb, first portion of the                            duodenum and second portion of the duodenum. Recommendation:           - Patient has a contact number available for                            emergencies. The signs and symptoms of potential  delayed complications were discussed with the                            patient. Return to normal activities tomorrow.                            Written discharge instructions were provided to the                            patient.                           - Soft diet today and progress slowly as tolerated                            tomorrow to baseline diet.                           - Continue present medications.                           - Use sucralfate suspension 1 gram PO QID PRN for                            esophageal discomfort.                           - Return to GI clinic PRN.                           - Repeat EGD with dilation as needed in the future. Doristine Locks, MD 03/28/2018 9:50:36 AM

## 2018-03-28 NOTE — Progress Notes (Signed)
Pt's states no medical or surgical changes since previsit or office visit. 

## 2018-03-28 NOTE — Telephone Encounter (Signed)
Spoke to the Pharmacy and clarified the order for Carafate by mouth qid prn.

## 2018-03-28 NOTE — Patient Instructions (Addendum)
   Sucrafate suspension order sent in to [facility] [address] for you to pick up  Follow dilatation diet given to you today.    YOU HAD AN ENDOSCOPIC PROCEDURE TODAY AT THE [facility]:   Refer to the procedure report that was given to you for any specific questions about what was found during the examination.  If the procedure report does not answer your questions, please call your gastroenterologist to clarify.  If you requested that your care partner not be given the details of your procedure findings, then the procedure report has been included in a sealed envelope for you to review at your convenience later.  YOU SHOULD EXPECT: Some feelings of bloating in the abdomen. Passage of more gas than usual.  Walking can help get rid of the air that was put into your GI tract during the procedure and reduce the bloating. If you had a lower endoscopy (such as a colonoscopy or flexible sigmoidoscopy) you may notice spotting of blood in your stool or on the toilet paper. If you underwent a bowel prep for your procedure, you may not have a normal bowel movement for a few days.  Please Note:  You might notice some irritation and congestion in your nose or some drainage.  This is from the oxygen used during your procedure.  There is no need for concern and it should clear up in a day or so.  SYMPTOMS TO REPORT IMMEDIATELY:     Following upper endoscopy (EGD)  Vomiting of blood or coffee ground material  New chest pain or pain under the shoulder blades  Painful or persistently difficult swallowing  New shortness of breath  Fever of 100F or higher  Black, tarry-looking stools  For urgent or emergent issues, a gastroenterologist can be reached at any hour by calling [phone].   DIET:  FOLLOW DILATATION DIET TODAY ( HANDOUT GIVEN TO YOU ) Drink plenty of fluids but you should avoid alcoholic beverages for 24 hours.  ACTIVITY:  You should plan to take it easy for the rest  of today and you should NOT DRIVE or use heavy machinery until tomorrow (because of the sedation medicines used during the test).    FOLLOW UP: Our staff will call the number listed on your records the next business day following your procedure to check on you and address any questions or concerns that you may have regarding the information given to you following your procedure. If we do not reach you, we will leave a message.  However, if you are feeling well and you are not experiencing any problems, there is no need to return our call.  We will assume that you have returned to your regular daily activities without incident.  If any biopsies were taken you will be contacted by phone or by letter within the next 1-3 weeks.  Please call us at [phone] if you have not heard about the biopsies in 3 weeks.    SIGNATURES/CONFIDENTIALITY: You and/or your care partner have signed paperwork which will be entered into your electronic medical record.  These signatures attest to the fact that that the information above on your After Visit Summary has been reviewed and is understood.  Full responsibility of the confidentiality of this discharge information lies with you and/or your care-partner.

## 2018-03-28 NOTE — Telephone Encounter (Signed)
Notified patient of LDCT lung cancer screening program results with recommendation for 6 month follow up imaging.  Also notified of incidental findings noted below and is encouraged to discuss further questions with PCP who will receive a copy of this not and/or the CT reports.  Patient verbalized understanding.     IMPRESSION: 1. Multiple areas of architectural distortion in both lungs, some of which have associated irregular nodular opacity. Appearance with similar on previous chest CT. Lung-RADS 3, probably benign findings. Short-term follow-up in 6 months is recommended with repeat low-dose chest CT without contrast (please use the following order, "CT CHEST LCS NODULE FOLLOW-UP W/O CM"). 2.  Emphysema. (ICD10-J43.9) 3.  Aortic Atherosclerois (ICD10-170.0) Voiced understanding.

## 2018-03-28 NOTE — Progress Notes (Signed)
A/ox3 pleased with MAC, report to RN 

## 2018-03-29 ENCOUNTER — Other Ambulatory Visit: Payer: Self-pay

## 2018-03-29 ENCOUNTER — Telehealth: Payer: Self-pay

## 2018-03-29 ENCOUNTER — Telehealth: Payer: Self-pay | Admitting: *Deleted

## 2018-03-29 ENCOUNTER — Telehealth: Payer: Self-pay | Admitting: Gastroenterology

## 2018-03-29 MED ORDER — LIDOCAINE VISCOUS HCL 2 % MT SOLN: 5.0000 mL | OROMUCOSAL | 0 refills | Status: DC | PRN

## 2018-03-29 NOTE — Telephone Encounter (Signed)
Pt called with a concern from her procedure.  She went to pick up her prescription last night and it was too expensive.  She was having some throat discomfort and called the after hours number and reported to the answering service that the medication was too expensive and she needed something else to help with the pain.  She later was in the restroom and received a call from the on call dr who left a message that this call "was not an emergency and she should call back during office hours".  She felt like this was not helpful and she wanted to report this to [place].  She also complained about the 7:30 follow up call that was made this morning and feels that a later time would be better. She states that Dr. Frankey Shown CMA has already returned her call this morning and is working on getting her a more affordable medication for her.

## 2018-03-29 NOTE — Telephone Encounter (Signed)
  Follow up Call-  Call back number 03/28/2018 03/13/2018  Post procedure Call Back phone  # [phone]  Permission to leave phone message Yes Yes  Some recent data might be hidden     Patient questions:  Do you have a fever, pain , or abdominal swelling? Yes.  Pt. Reports discomfort Pain Score  0 *  Have you tolerated food without any problems? No.  Pt. Reports she tried to eat scrambled eggs this morning, but they didn't go down easily.   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   Pt. Reports her prescribed Sucralfate was going to cost > $200 after insurance, and she cannot afford it.  Wants to know what she can take.  Have you been able to return to your normal activities? Yes.    Do you have any questions about your discharge instructions: Diet   Yes.   Medications  Yes.   Follow up visit  No.  Do you have questions or concerns about your Care? Yes.    Actions: * If pain score is 4 or above: Physician/ provider Notified : Vito Cirigliano, DO  As outlined above, pt. Wants to know what she can take instead of Sucralfate, which is too expensive.Marland Kitchen

## 2018-03-29 NOTE — Telephone Encounter (Signed)
Roney Mans CMA at [phone] to advise Dr. Barron Alvine of potential unavailability of Viscous Lidocaine 4%.  Called [facility] to inquire as to availability of 4%.  They only have 2 % available.  Called Heather back at 1:49, she said Dr. Barron Alvine said 2% is acceptable.  Dispense bottle to pt. With previous dose instructions.

## 2018-03-29 NOTE — Telephone Encounter (Signed)
LM on pt. Voice mail that Dr. Barron Alvine was not surprised she might have some inflammation after her procedure, contributing to her difficulty. We are sending in a prescription for viscous Lidocaine to drink every 6 hours as directed and needed for esophageal discomfort, or prior to meals.  Remain on a soft, pureed diet and very slowly advance diet as tolerated.  Continue to drink liquids to stay hydrated, and call back if she has any further concerns.

## 2018-03-29 NOTE — Telephone Encounter (Signed)
Spoke to the patient. Let her know I have samples at the [facility] if she would like to come by and pick them up. She stated she would call her daughter to see if she could come by to get them and she would call back to let me know.

## 2018-03-29 NOTE — Telephone Encounter (Signed)
Called pt.'s [facility] to record 2% viscous lidocaine instructions, drink 5 mL q 6 hrs as needed for esophageal discomfort, or prior to meals.  100 mL to be dispensed, no refills.  Called pt. To advise that since she was unavailable by phone, prescription sent into [facility] as preferred pharmacy as indicated on EMR.

## 2018-03-29 NOTE — Telephone Encounter (Signed)
Pt called to inform that carafate is too expensive and wants to know if she can get something more affordable.

## 2018-03-29 NOTE — Telephone Encounter (Signed)
No answer for post procedure call back. Left message for patient to call with questions or concerns. SM 

## 2018-03-30 NOTE — Telephone Encounter (Signed)
Patient came by and picked up samples of Carafate. Notified pharmacy to disregard order.

## 2018-04-02 ENCOUNTER — Other Ambulatory Visit: Payer: Self-pay

## 2018-04-02 ENCOUNTER — Other Ambulatory Visit: Payer: Self-pay | Admitting: Gastroenterology

## 2018-04-02 ENCOUNTER — Telehealth: Payer: Self-pay | Admitting: Gastroenterology

## 2018-04-02 DIAGNOSIS — R1310 Dysphagia, unspecified: Secondary | ICD-10-CM

## 2018-04-02 MED ORDER — LIDOCAINE VISCOUS HCL 2 % MT SOLN: 5.0000 mL | OROMUCOSAL | 0 refills | Status: DC | PRN

## 2018-04-02 NOTE — Telephone Encounter (Signed)
Called and spoke with patient-patient reports difficulty with clearing throat, constant coughing, and "throat is bothersome with swallowing"; pt reports a hoarse voice and feeling "ugh"-"at first I thought it is just a cold but I keep having to swallow more often and it is not comfortable-I have not had a cold in many years"; pt has admitted to trying the samples of carafate "for my throat to quit bothering me"- Pt also admitted to not filling the Rx of viscous lidocaine that was prescribed last week; Pt advised to get the lidocaine Rx filled and to try it and to call back by the end of the week to update on status of symptoms or if she feels an appt is wanted;   Per your note, the patient may need a esophageal manometry? Do you feel you want to proceed with this procedure for the patient?

## 2018-04-02 NOTE — Telephone Encounter (Signed)
Rx submitted to alternate pharmacy per patient request

## 2018-04-02 NOTE — Progress Notes (Signed)
Rx printed to fax to pharmacy per patient request

## 2018-04-02 NOTE — Telephone Encounter (Signed)
Pt needs some advise, she stated that she has not feel well since proc, she did not want to provide more details. Pls call her.

## 2018-04-03 DIAGNOSIS — Z1231 Encounter for screening mammogram for malignant neoplasm of breast: Secondary | ICD-10-CM | POA: Diagnosis not present

## 2018-04-03 NOTE — Telephone Encounter (Signed)
Called and spoke with patient-advised patient of MD recommendations regarding swallowing issue -(previous message)-pt reported she received the medicine yesterday-"I did not get clear instructions from the pharmacist -if I was supposed to spit it out or to swallow it-so I have been spitting it out"- Patient advised she should be swallowing the medicine as it is to help with dysphagia;  Patient verbalized understanding of instructions;  Patient also advised of MD recommendation of making a follow up appt-pt has requested that she call back at a later time to schedule as she wants to see if this medication works for her; patient advised to call back when available to schedule her appt and if she has questions/concerns;

## 2018-04-03 NOTE — Telephone Encounter (Signed)
No, since she had a proximal stricture that was dilated last week, we can hold off on the Manometry at this time and instead just schedule follow-up with me in the next couple of weeks.  Thank you

## 2018-04-05 DIAGNOSIS — F439 Reaction to severe stress, unspecified: Secondary | ICD-10-CM | POA: Diagnosis not present

## 2018-04-05 DIAGNOSIS — L853 Xerosis cutis: Secondary | ICD-10-CM | POA: Diagnosis not present

## 2018-04-05 DIAGNOSIS — M545 Low back pain: Secondary | ICD-10-CM | POA: Diagnosis not present

## 2018-04-05 DIAGNOSIS — I10 Essential (primary) hypertension: Secondary | ICD-10-CM | POA: Diagnosis not present

## 2018-04-06 ENCOUNTER — Encounter: Payer: Self-pay | Admitting: Gastroenterology

## 2018-04-06 DIAGNOSIS — H52203 Unspecified astigmatism, bilateral: Secondary | ICD-10-CM | POA: Diagnosis not present

## 2018-04-06 DIAGNOSIS — H524 Presbyopia: Secondary | ICD-10-CM | POA: Diagnosis not present

## 2018-04-06 DIAGNOSIS — H5203 Hypermetropia, bilateral: Secondary | ICD-10-CM | POA: Diagnosis not present

## 2018-04-24 ENCOUNTER — Encounter (INDEPENDENT_AMBULATORY_CARE_PROVIDER_SITE_OTHER): Payer: Self-pay | Admitting: Orthopaedic Surgery

## 2018-04-24 ENCOUNTER — Ambulatory Visit (INDEPENDENT_AMBULATORY_CARE_PROVIDER_SITE_OTHER): Payer: Medicare HMO | Admitting: Orthopaedic Surgery

## 2018-04-24 VITALS — BP 126/79 | HR 74 | Ht 61.0 in | Wt 180.0 lb

## 2018-04-24 DIAGNOSIS — M5432 Sciatica, left side: Secondary | ICD-10-CM

## 2018-04-24 NOTE — Progress Notes (Signed)
Office Visit Note   Patient: [name]           Date of Birth: 10/13/1945           MRN: [id number] Visit Date: 04/24/2018              Requested by: Tally JoeSwayne, David, MD [phone] [address] , [place] [id number] PCP: Tally JoeSwayne, David, MD   Assessment & Plan: Visit Diagnoses:  1. Chronic sciatica of left side     Plan: Patient is now 4 years out from facet cyst excision with persistent chronic back symptoms.  She had some stenosis left in the foramen and extraforaminal affecting the left L5 nerve root on last scan in July 2016.  Will obtain a new MRI scan for evaluation and possible dural based on findings.  Follow-Up Instructions: No follow-ups on file.   Orders:  No orders of the defined types were placed in this encounter.  No orders of the defined types were placed in this encounter.     Procedures: No procedures performed   Clinical Data: No additional findings.   Subjective: Chief Complaint  Patient presents with  . Lower Back - Pain    HPI 72 year old female returns she had previous left L5-S1 hemilaminectomy with removal of intraspinal extradural facet cyst.  She has more left leg pain and symptoms and right past history of polio with 1/2 inch leg length inequality she has some shoes she uses wedges on the right side to help prevent foot eversion as well.  Her back pain is been chronic she sometimes delay some activities that involve walking due to her back pain.  She is occasionally using a Tylenol 3 she has about a half bottle left from a couple months ago.  Patient is interested in considering an epidural injection.  She has not had any imaging studies since 2016.  Review of Systems previous left hemilaminectomy L5-S1 2015.  History of polio.  Left distal femoral epiphysiodesis growth arrest as a child.  14 point review of systems otherwise negative as pertains HPI.   Objective: Vital Signs: BP 126/79   Pulse 74   Ht 5\' 1"  (1.549 m)   Wt 180  lb (81.6 kg)   BMI 34.01 kg/m   Physical Exam  Constitutional: She is oriented to person, place, and time. She appears well-developed.  HENT:  Head: Normocephalic.  Right Ear: External ear normal.  Left Ear: External ear normal.  Eyes: Pupils are equal, round, and reactive to light.  Neck: No tracheal deviation present. No thyromegaly present.  Cardiovascular: Normal rate.  Pulmonary/Chest: Effort normal.  Abdominal: Soft.  Neurological: She is alert and oriented to person, place, and time.  Skin: Skin is warm and dry.  Psychiatric: She has a normal mood and affect. Her behavior is normal.    Ortho Exam well-healed lumbar incision patient has some pain with straight leg raising on the left and right some sciatic notch tenderness.  Weldon healed incisions from previous epiphysiodesis femur.  She has some toe crossover.  Posterior tibial transfer in the right leg.  Left distal femur growth arrest.  Good hip range of motion negative logroll.  No pitting edema pulses are palpable.  Specialty Comments:  No specialty comments available.  Imaging: No results found.   PMFS History: Patient Active Problem List   Diagnosis Date Noted  . Chronic sciatica of left side 04/01/2015  . HNP (herniated nucleus pulposus), lumbar 02/21/2014  . Synovial cyst of  lumbar spine 02/21/2014  . HNP (herniated nucleus pulposus) 02/21/2014   Past Medical History:  Diagnosis Date  . Anxiety   . Bronchitis due to tobacco use    quit smoking 3 months ago  . Chronic sciatica of left side 04/01/2015  . COPD (chronic obstructive pulmonary disease) (HCC)   . Depression   . GERD (gastroesophageal reflux disease)   . Hypertension   . Mitral valve prolapse   . Pneumonia    years ago  . Polio    age 16 years old    Family History  Problem Relation Age of Onset  . Cancer Mother        thyroid  . Heart attack Father   . Cancer Sister        breast  . Kidney disease Sister        ESRD  . Irritable  bowel syndrome Sister   . Cancer Sister        thyroid  . Colon cancer Neg Hx   . Esophageal cancer Neg Hx     Past Surgical History:  Procedure Laterality Date  . ABDOMINAL HYSTERECTOMY  1985  . APPENDECTOMY    . COLONOSCOPY  2017  . LUMBAR LAMINECTOMY Left 02/21/2014   Procedure: Left L5-S1 Hemilaminectomy, Lateral Recess Decompression, Removal Synovial Cyst;  Surgeon: Eldred Manges, MD;  Location: [facility] OR;  Service: Orthopedics;  Laterality: Left;  . polio Bilateral 1950's   surgery on feet and legs for polio  . TONSILLECTOMY     Social History   Occupational History  . Occupation: Retired    Associate Professor: [employer]  Tobacco Use  . Smoking status: Current Every Day Smoker    Packs/day: 1.50    Years: 54.00    Pack years: 81.00    Types: Cigarettes  . Smokeless tobacco: Never Used  . Tobacco comment: currently 2 packs per day  Substance and Sexual Activity  . Alcohol use: Yes    Alcohol/week: 0.0 standard drinks    Comment: occasional  . Drug use: No  . Sexual activity: Not on file

## 2018-04-24 NOTE — Addendum Note (Signed)
Addended by: Rogers SeedsYEATTS, Emilyanne Mcgough M on: 04/24/2018 02:20 PM   Modules accepted: Orders

## 2018-05-03 ENCOUNTER — Other Ambulatory Visit (INDEPENDENT_AMBULATORY_CARE_PROVIDER_SITE_OTHER): Payer: Self-pay | Admitting: Orthopaedic Surgery

## 2018-05-08 ENCOUNTER — Ambulatory Visit
Admission: RE | Admit: 2018-05-08 | Discharge: 2018-05-08 | Disposition: A | Discharge status: Home / Self Care | Payer: Medicare HMO | Source: Ambulatory Visit | Attending: Orthopaedic Surgery | Admitting: Orthopaedic Surgery

## 2018-05-08 ENCOUNTER — Ambulatory Visit (INDEPENDENT_AMBULATORY_CARE_PROVIDER_SITE_OTHER): Payer: Medicare HMO | Admitting: Orthopaedic Surgery

## 2018-05-08 DIAGNOSIS — M5432 Sciatica, left side: Secondary | ICD-10-CM

## 2018-05-08 DIAGNOSIS — M5126 Other intervertebral disc displacement, lumbar region: Secondary | ICD-10-CM | POA: Diagnosis not present

## 2018-05-08 MED ORDER — GADOBENATE DIMEGLUMINE 529 MG/ML IV SOLN
17.0000 mL | Freq: Once | INTRAVENOUS | Status: AC | PRN
  Administered 2018-05-08: 17 mL via INTRAVENOUS

## 2018-05-11 ENCOUNTER — Encounter (INDEPENDENT_AMBULATORY_CARE_PROVIDER_SITE_OTHER): Payer: Self-pay

## 2018-05-11 ENCOUNTER — Telehealth (INDEPENDENT_AMBULATORY_CARE_PROVIDER_SITE_OTHER): Payer: Self-pay | Admitting: Radiology

## 2018-05-11 ENCOUNTER — Encounter (INDEPENDENT_AMBULATORY_CARE_PROVIDER_SITE_OTHER): Payer: Self-pay | Admitting: Orthopaedic Surgery

## 2018-05-11 ENCOUNTER — Ambulatory Visit (INDEPENDENT_AMBULATORY_CARE_PROVIDER_SITE_OTHER): Payer: Medicare HMO | Admitting: Orthopaedic Surgery

## 2018-05-11 VITALS — BP 110/72 | HR 80 | Ht 62.0 in | Wt 185.0 lb

## 2018-05-11 DIAGNOSIS — M5432 Sciatica, left side: Secondary | ICD-10-CM

## 2018-05-11 DIAGNOSIS — M5431 Sciatica, right side: Secondary | ICD-10-CM

## 2018-05-11 DIAGNOSIS — Z5321 Procedure and treatment not carried out due to patient leaving prior to being seen by health care provider: Secondary | ICD-10-CM

## 2018-05-11 MED ORDER — ACETAMINOPHEN-CODEINE #3 300-30 MG PO TABS: 1.0000 | ORAL_TABLET | Freq: Two times a day (BID) | ORAL | 0 refills | Status: DC

## 2018-05-11 NOTE — Telephone Encounter (Signed)
Patient came in to the office to review MRI Lumbar Spine but left due to wait.  Could you please call to advise of results? Also, she requested possible refill on Tylenol #3.

## 2018-05-11 NOTE — Telephone Encounter (Signed)
Ok refill? 

## 2018-05-11 NOTE — Telephone Encounter (Signed)
Med called to pharmacy. Patient aware. She had to leave earlier due to having another appointment. She would appreciate call to discuss MRI.

## 2018-05-14 ENCOUNTER — Encounter (INDEPENDENT_AMBULATORY_CARE_PROVIDER_SITE_OTHER): Payer: Self-pay | Admitting: Orthopaedic Surgery

## 2018-05-14 NOTE — Telephone Encounter (Signed)
Please see below.

## 2018-05-14 NOTE — Telephone Encounter (Signed)
Patient left a message stating she was returning your call. 

## 2018-05-14 NOTE — Telephone Encounter (Signed)
I called and left message. Called later still no answer. Mild spondylosis without signif. compression

## 2018-07-31 DIAGNOSIS — E7800 Pure hypercholesterolemia, unspecified: Secondary | ICD-10-CM | POA: Diagnosis not present

## 2018-07-31 DIAGNOSIS — Z8612 Personal history of poliomyelitis: Secondary | ICD-10-CM | POA: Diagnosis not present

## 2018-07-31 DIAGNOSIS — G4700 Insomnia, unspecified: Secondary | ICD-10-CM | POA: Diagnosis not present

## 2018-07-31 DIAGNOSIS — Z720 Tobacco use: Secondary | ICD-10-CM | POA: Diagnosis not present

## 2018-07-31 DIAGNOSIS — F419 Anxiety disorder, unspecified: Secondary | ICD-10-CM | POA: Diagnosis not present

## 2018-07-31 DIAGNOSIS — K219 Gastro-esophageal reflux disease without esophagitis: Secondary | ICD-10-CM | POA: Diagnosis not present

## 2018-07-31 DIAGNOSIS — I10 Essential (primary) hypertension: Secondary | ICD-10-CM | POA: Diagnosis not present

## 2018-07-31 DIAGNOSIS — J449 Chronic obstructive pulmonary disease, unspecified: Secondary | ICD-10-CM | POA: Diagnosis not present

## 2018-09-10 ENCOUNTER — Encounter: Payer: Self-pay | Admitting: *Deleted

## 2018-09-10 DIAGNOSIS — R9389 Abnormal findings on diagnostic imaging of other specified body structures: Secondary | ICD-10-CM | POA: Diagnosis not present

## 2018-09-10 DIAGNOSIS — J449 Chronic obstructive pulmonary disease, unspecified: Secondary | ICD-10-CM | POA: Diagnosis not present

## 2018-09-10 DIAGNOSIS — R490 Dysphonia: Secondary | ICD-10-CM | POA: Diagnosis not present

## 2018-09-10 DIAGNOSIS — Z720 Tobacco use: Secondary | ICD-10-CM | POA: Diagnosis not present

## 2018-09-10 DIAGNOSIS — K5900 Constipation, unspecified: Secondary | ICD-10-CM | POA: Diagnosis not present

## 2018-09-10 DIAGNOSIS — K219 Gastro-esophageal reflux disease without esophagitis: Secondary | ICD-10-CM | POA: Diagnosis not present

## 2018-09-17 ENCOUNTER — Telehealth: Payer: Self-pay | Admitting: *Deleted

## 2018-09-17 ENCOUNTER — Other Ambulatory Visit: Payer: Self-pay | Admitting: *Deleted

## 2018-09-17 DIAGNOSIS — Z122 Encounter for screening for malignant neoplasm of respiratory organs: Secondary | ICD-10-CM

## 2018-09-17 DIAGNOSIS — Z87891 Personal history of nicotine dependence: Secondary | ICD-10-CM

## 2018-09-17 DIAGNOSIS — R918 Other nonspecific abnormal finding of lung field: Secondary | ICD-10-CM

## 2018-09-17 NOTE — Telephone Encounter (Signed)
Contacted patient who is due for lcs nodule f/u soon. Patient is agreeable to have CT scheduled. Order placed and will call patient with appt within 24 hours.

## 2018-09-19 DIAGNOSIS — G4700 Insomnia, unspecified: Secondary | ICD-10-CM | POA: Diagnosis not present

## 2018-09-19 DIAGNOSIS — G4761 Periodic limb movement disorder: Secondary | ICD-10-CM | POA: Diagnosis not present

## 2018-09-24 DIAGNOSIS — K219 Gastro-esophageal reflux disease without esophagitis: Secondary | ICD-10-CM | POA: Diagnosis not present

## 2018-09-24 DIAGNOSIS — F17210 Nicotine dependence, cigarettes, uncomplicated: Secondary | ICD-10-CM | POA: Diagnosis not present

## 2018-09-24 DIAGNOSIS — R490 Dysphonia: Secondary | ICD-10-CM | POA: Diagnosis not present

## 2018-09-26 ENCOUNTER — Ambulatory Visit
Admission: RE | Admit: 2018-09-26 | Discharge: 2018-09-26 | Disposition: A | Discharge status: Home / Self Care | Payer: Medicare HMO | Source: Ambulatory Visit | Attending: Nurse Practitioner | Admitting: Nurse Practitioner

## 2018-09-26 ENCOUNTER — Other Ambulatory Visit: Payer: Self-pay

## 2018-09-26 DIAGNOSIS — Z122 Encounter for screening for malignant neoplasm of respiratory organs: Secondary | ICD-10-CM | POA: Insufficient documentation

## 2018-09-26 DIAGNOSIS — I700 Atherosclerosis of aorta: Secondary | ICD-10-CM | POA: Diagnosis not present

## 2018-09-26 DIAGNOSIS — Z87891 Personal history of nicotine dependence: Secondary | ICD-10-CM | POA: Diagnosis not present

## 2018-09-26 DIAGNOSIS — J439 Emphysema, unspecified: Secondary | ICD-10-CM | POA: Insufficient documentation

## 2018-09-26 DIAGNOSIS — R918 Other nonspecific abnormal finding of lung field: Secondary | ICD-10-CM

## 2018-10-01 ENCOUNTER — Encounter: Payer: Self-pay | Admitting: *Deleted

## 2018-10-04 NOTE — H&P (Signed)
  [name] is an 73 y.o. female.   Chief Complaint: Chronic hoarseness HPI: Long history of smoking, reflux and hoarseness.  Past Medical History:  Diagnosis Date  . Anxiety   . Bronchitis due to tobacco use    quit smoking 3 months ago  . Chronic sciatica of left side 04/01/2015  . COPD (chronic obstructive pulmonary disease) (HCC)   . Depression   . GERD (gastroesophageal reflux disease)   . Hypertension   . Mitral valve prolapse   . Pneumonia    years ago  . Polio    age 70 years old    Past Surgical History:  Procedure Laterality Date  . ABDOMINAL HYSTERECTOMY  1985  . APPENDECTOMY    . COLONOSCOPY  2017  . LUMBAR LAMINECTOMY Left 02/21/2014   Procedure: Left L5-S1 Hemilaminectomy, Lateral Recess Decompression, Removal Synovial Cyst;  Surgeon: Eldred Manges, MD;  Location: [facility] OR;  Service: Orthopedics;  Laterality: Left;  . polio Bilateral 1950's   surgery on feet and legs for polio  . TONSILLECTOMY      Family History  Problem Relation Age of Onset  . Cancer Mother        thyroid  . Heart attack Father   . Cancer Sister        breast  . Kidney disease Sister        ESRD  . Irritable bowel syndrome Sister   . Cancer Sister        thyroid  . Colon cancer Neg Hx   . Esophageal cancer Neg Hx    Social History:  reports that she has been smoking cigarettes. She has a 81.00 pack-year smoking history. She has never used smokeless tobacco. She reports current alcohol use. She reports that she does not use drugs.  Allergies: No Known Allergies  No medications prior to admission.    No results found for this or any previous visit (from the past 48 hour(s)). No results found.  ROS: otherwise negative  There were no vitals taken for this visit.  PHYSICAL EXAM: Overall appearance:  Healthy appearing, in no distress. Voice is very hoarse. Head:  Normocephalic, atraumatic. Ears: External auditory canals are clear; tympanic membranes are intact and the middle  ears are free of any effusion. Nose: External nose is healthy in appearance. Internal nasal exam free of any lesions or obstruction. Oral Cavity/pharynx:  There are no mucosal lesions or masses identified. Hypopharynx/Larynx: no signs of any mucosal lesions or masses identified. Vocal cords move normally. Neuro:  No identifiable neurologic deficits. Neck: No palpable neck masses.  Studies Reviewed: none    Assessment/Plan Chronic hoarseness and a longtime smoker and reflux sufferer.  Recommend attempts to quit smoking and cut back on caffeine.  She is unable to tolerate indirect exam of the larynx.  She is terrified of fiberoptic light examination of the larynx.  The only other option is operative endoscopy to evaluate the cords.  Serena Colonel 10/04/2018, 11:22 AM

## 2018-10-08 NOTE — Pre-Procedure Instructions (Signed)
[name]  10/08/2018      [facility] - [place], [place] - [address] [place] [address] Phone: [phone] Fax: [phone]  [facility] - [place], [place] - [address] [place] [address] Phone: [phone] Fax: [phone]  [facility] [address] [place], [place] - [address] [place] [address] [place] [address] Phone: [phone] Fax: [phone]    Your procedure is scheduled on Oct 11, 2018.  Report to [facility] Entrance "A" at 1100 AM.  Call this number if you have problems the morning of surgery:  [phone]   Remember:  Do not eat or drink after midnight.    Take these medicines the morning of surgery with A SIP OF WATER  Omeprazole (prilosec) Tylenol #3 -if needed Benzonatate (Tessalon)-if needed Gabapentin (neurontin)-if needed  Beginning now, STOP taking any Aspirin (unless otherwise instructed by your surgeon), Aleve, Naproxen, Ibuprofen, Motrin, Advil, Goody's, BC's, all herbal medications, fish oil, and all vitamins    Do not wear jewelry, make-up or nail polish.  Do not wear lotions, powders, or perfumes, or deodorant.  Do not shave 48 hours prior to surgery    Do not bring valuables to the hospital.  [facility] is not responsible for any belongings or valuables.  Contacts, dentures or bridgework may not be worn into surgery.  Leave your suitcase in the car.  After surgery it may be brought to your room.  For patients admitted to the hospital, discharge time will be determined by your treatment team.  Patients discharged the day of surgery will not be allowed to drive home.    [place]- Preparing For Surgery  Before surgery, you can play an important role. Because skin is not sterile, your skin needs to be as free of germs as possible. You can reduce the number of germs on your skin by washing with CHG  (chlorahexidine gluconate) Soap before surgery.  CHG is an antiseptic cleaner which kills germs and bonds with the skin to continue killing germs even after washing.    Oral Hygiene is also important to reduce your risk of infection.  Remember - BRUSH YOUR TEETH THE MORNING OF SURGERY WITH YOUR REGULAR TOOTHPASTE  Please do not use if you have an allergy to CHG or antibacterial soaps. If your skin becomes reddened/irritated stop using the CHG.  Do not shave (including legs and underarms) for at least 48 hours prior to first CHG shower. It is OK to shave your face.  Please follow these instructions carefully.   1. Shower the NIGHT BEFORE SURGERY and the MORNING OF SURGERY with CHG.   2. If you chose to wash your hair, wash your hair first as usual with your normal shampoo.  3. After you shampoo, rinse your hair and body thoroughly to remove the shampoo.  4. Use CHG as you would any other liquid soap. You can apply CHG directly to the skin and wash gently with a scrungie or a clean washcloth.   5. Apply the CHG Soap to your body ONLY FROM THE NECK DOWN.  Do not use on open wounds or open sores. Avoid contact with your eyes, ears, mouth and genitals (private parts). Wash Face and genitals (private parts)  with your normal soap.  6. Wash thoroughly, paying special attention to the area where your surgery will be performed.  7. Thoroughly rinse your body with  warm water from the neck down.  8. DO NOT shower/wash with your normal soap after using and rinsing off the CHG Soap.  9. Pat yourself dry with a CLEAN TOWEL.  10. Wear CLEAN PAJAMAS to bed the night before surgery, wear comfortable clothes the morning of surgery  11. Place CLEAN SHEETS on your bed the night of your first shower and DO NOT SLEEP WITH PETS.  Day of Surgery:  Do not apply any deodorants/lotions.  Please wear clean clothes to the hospital/surgery center.   Remember to brush your teeth WITH YOUR REGULAR  TOOTHPASTE.  Please read over the following fact sheets that you were given.

## 2018-10-09 ENCOUNTER — Inpatient Hospital Stay (HOSPITAL_COMMUNITY)
Admission: RE | Admit: 2018-10-09 | Discharge: 2018-10-09 | Disposition: A | Discharge status: Home / Self Care | Payer: Medicare HMO | Source: Ambulatory Visit

## 2018-10-09 ENCOUNTER — Other Ambulatory Visit (HOSPITAL_COMMUNITY)
Admission: RE | Admit: 2018-10-09 | Discharge: 2018-10-09 | Disposition: A | Discharge status: Home / Self Care | Payer: Medicare HMO | Source: Ambulatory Visit | Attending: Otolaryngology | Admitting: Otolaryngology

## 2018-10-09 ENCOUNTER — Other Ambulatory Visit: Payer: Self-pay

## 2018-10-09 ENCOUNTER — Encounter (HOSPITAL_COMMUNITY)
Admission: RE | Admit: 2018-10-09 | Discharge: 2018-10-09 | Disposition: A | Discharge status: Home / Self Care | Payer: Medicare HMO | Source: Ambulatory Visit | Attending: Otolaryngology | Admitting: Otolaryngology

## 2018-10-09 ENCOUNTER — Encounter (HOSPITAL_COMMUNITY): Payer: Self-pay

## 2018-10-09 DIAGNOSIS — R490 Dysphonia: Secondary | ICD-10-CM | POA: Diagnosis present

## 2018-10-09 DIAGNOSIS — J449 Chronic obstructive pulmonary disease, unspecified: Secondary | ICD-10-CM | POA: Diagnosis not present

## 2018-10-09 DIAGNOSIS — J381 Polyp of vocal cord and larynx: Secondary | ICD-10-CM | POA: Diagnosis not present

## 2018-10-09 DIAGNOSIS — Z1159 Encounter for screening for other viral diseases: Secondary | ICD-10-CM | POA: Insufficient documentation

## 2018-10-09 DIAGNOSIS — K219 Gastro-esophageal reflux disease without esophagitis: Secondary | ICD-10-CM | POA: Diagnosis not present

## 2018-10-09 DIAGNOSIS — Z8612 Personal history of poliomyelitis: Secondary | ICD-10-CM | POA: Diagnosis not present

## 2018-10-09 DIAGNOSIS — I10 Essential (primary) hypertension: Secondary | ICD-10-CM | POA: Insufficient documentation

## 2018-10-09 DIAGNOSIS — F17210 Nicotine dependence, cigarettes, uncomplicated: Secondary | ICD-10-CM | POA: Diagnosis not present

## 2018-10-09 DIAGNOSIS — Z01818 Encounter for other preprocedural examination: Secondary | ICD-10-CM | POA: Insufficient documentation

## 2018-10-09 LAB — BASIC METABOLIC PANEL
Anion gap: 11 (ref 5–15)
BUN: 21 mg/dL (ref 8–23)
CO2: 24 mmol/L (ref 22–32)
Calcium: 9.9 mg/dL (ref 8.9–10.3)
Chloride: 104 mmol/L (ref 98–111)
Creatinine, Ser: 1.19 mg/dL — ABNORMAL HIGH (ref 0.44–1.00)
GFR calc Af Amer: 52 mL/min — ABNORMAL LOW (ref >60)
GFR calc non Af Amer: 45 mL/min — ABNORMAL LOW (ref >60)
Glucose, Bld: 129 mg/dL — ABNORMAL HIGH (ref 70–99)
Potassium: 3.5 mmol/L (ref 3.5–5.1)
Sodium: 139 mmol/L (ref 135–145)

## 2018-10-09 LAB — CBC
HCT: 39.5 % (ref 36.0–46.0)
Hemoglobin: 12.8 g/dL (ref 12.0–15.0)
MCH: 29.2 pg (ref 26.0–34.0)
MCHC: 32.4 g/dL (ref 30.0–36.0)
MCV: 90 fL (ref 80.0–100.0)
Platelets: 287 10*3/uL (ref 150–400)
RBC: 4.39 MIL/uL (ref 3.87–5.11)
RDW: 15.1 % (ref 11.5–15.5)
WBC: 6.5 10*3/uL (ref 4.0–10.5)
nRBC: 0 % (ref 0.0–0.2)

## 2018-10-10 LAB — NOVEL CORONAVIRUS, NAA (HOSP ORDER, SEND-OUT TO REF LAB; TAT 18-24 HRS): SARS-CoV-2, NAA: NOT DETECTED

## 2018-10-11 ENCOUNTER — Encounter (HOSPITAL_COMMUNITY)
Admission: RE | Disposition: A | Discharge status: Home / Self Care | Payer: Self-pay | Source: Home / Self Care | Attending: Otolaryngology

## 2018-10-11 ENCOUNTER — Ambulatory Visit (HOSPITAL_COMMUNITY)
Admission: RE | Admit: 2018-10-11 | Discharge: 2018-10-11 | Disposition: A | Discharge status: Home / Self Care | Payer: Medicare HMO | Attending: Otolaryngology | Admitting: Otolaryngology

## 2018-10-11 ENCOUNTER — Encounter (HOSPITAL_COMMUNITY): Payer: Self-pay

## 2018-10-11 ENCOUNTER — Ambulatory Visit (HOSPITAL_COMMUNITY): Payer: Medicare HMO | Admitting: Certified Registered Nurse Anesthetist

## 2018-10-11 DIAGNOSIS — K219 Gastro-esophageal reflux disease without esophagitis: Secondary | ICD-10-CM | POA: Insufficient documentation

## 2018-10-11 DIAGNOSIS — Z8612 Personal history of poliomyelitis: Secondary | ICD-10-CM | POA: Diagnosis not present

## 2018-10-11 DIAGNOSIS — R490 Dysphonia: Secondary | ICD-10-CM | POA: Diagnosis not present

## 2018-10-11 DIAGNOSIS — J383 Other diseases of vocal cords: Secondary | ICD-10-CM | POA: Diagnosis not present

## 2018-10-11 DIAGNOSIS — I10 Essential (primary) hypertension: Secondary | ICD-10-CM | POA: Diagnosis not present

## 2018-10-11 DIAGNOSIS — J449 Chronic obstructive pulmonary disease, unspecified: Secondary | ICD-10-CM | POA: Insufficient documentation

## 2018-10-11 DIAGNOSIS — J381 Polyp of vocal cord and larynx: Secondary | ICD-10-CM | POA: Insufficient documentation

## 2018-10-11 DIAGNOSIS — F17210 Nicotine dependence, cigarettes, uncomplicated: Secondary | ICD-10-CM | POA: Diagnosis not present

## 2018-10-11 DIAGNOSIS — F418 Other specified anxiety disorders: Secondary | ICD-10-CM | POA: Diagnosis not present

## 2018-10-11 DIAGNOSIS — Z1159 Encounter for screening for other viral diseases: Secondary | ICD-10-CM | POA: Insufficient documentation

## 2018-10-11 HISTORY — PX: MICROLARYNGOSCOPY: SHX5208

## 2018-10-11 SURGERY — MICROLARYNGOSCOPY
Anesthesia: General | Site: Mouth

## 2018-10-11 MED ORDER — LIDOCAINE-EPINEPHRINE 1 %-1:100000 IJ SOLN
INTRAMUSCULAR | Status: AC
  Filled 2018-10-11: qty 1

## 2018-10-11 MED ORDER — PHENYLEPHRINE HCL (PRESSORS) 10 MG/ML IV SOLN
INTRAVENOUS | Status: DC | PRN
  Administered 2018-10-11 (×3): 80 ug via INTRAVENOUS

## 2018-10-11 MED ORDER — LIDOCAINE 2% (20 MG/ML) 5 ML SYRINGE
INTRAMUSCULAR | Status: DC | PRN
  Administered 2018-10-11: 20 mg via INTRAVENOUS

## 2018-10-11 MED ORDER — MIDAZOLAM HCL 2 MG/2ML IJ SOLN: 0.5000 mg | Freq: Once | INTRAMUSCULAR | Status: DC | PRN

## 2018-10-11 MED ORDER — EPINEPHRINE HCL (NASAL) 0.1 % NA SOLN
NASAL | Status: DC | PRN
  Administered 2018-10-11: 30 mL via TOPICAL

## 2018-10-11 MED ORDER — PROPOFOL 10 MG/ML IV BOLUS
INTRAVENOUS | Status: DC | PRN
  Administered 2018-10-11: 100 mg via INTRAVENOUS

## 2018-10-11 MED ORDER — SUCCINYLCHOLINE CHLORIDE 200 MG/10ML IV SOSY
PREFILLED_SYRINGE | INTRAVENOUS | Status: DC | PRN
  Administered 2018-10-11: 100 mg via INTRAVENOUS

## 2018-10-11 MED ORDER — EPHEDRINE SULFATE 50 MG/ML IJ SOLN
INTRAMUSCULAR | Status: DC | PRN
  Administered 2018-10-11: 5 mg via INTRAVENOUS

## 2018-10-11 MED ORDER — TRIAMCINOLONE ACETONIDE 40 MG/ML IJ SUSP
INTRAMUSCULAR | Status: AC
  Filled 2018-10-11: qty 5

## 2018-10-11 MED ORDER — EPINEPHRINE HCL (NASAL) 0.1 % NA SOLN
NASAL | Status: AC
  Filled 2018-10-11: qty 30

## 2018-10-11 MED ORDER — STERILE WATER FOR IRRIGATION IR SOLN
Status: DC | PRN
  Administered 2018-10-11: 1000 mL

## 2018-10-11 MED ORDER — ONDANSETRON HCL 4 MG/2ML IJ SOLN
INTRAMUSCULAR | Status: DC | PRN
  Administered 2018-10-11: 4 mg via INTRAVENOUS

## 2018-10-11 MED ORDER — FENTANYL CITRATE (PF) 250 MCG/5ML IJ SOLN
INTRAMUSCULAR | Status: DC | PRN
  Administered 2018-10-11: 50 ug via INTRAVENOUS

## 2018-10-11 MED ORDER — FENTANYL CITRATE (PF) 100 MCG/2ML IJ SOLN: 25.0000 ug | INTRAMUSCULAR | Status: DC | PRN

## 2018-10-11 MED ORDER — MEPERIDINE HCL 25 MG/ML IJ SOLN: 6.2500 mg | INTRAMUSCULAR | Status: DC | PRN

## 2018-10-11 MED ORDER — ONDANSETRON HCL 4 MG/2ML IJ SOLN
INTRAMUSCULAR | Status: AC
  Filled 2018-10-11: qty 2

## 2018-10-11 MED ORDER — PROPOFOL 10 MG/ML IV BOLUS
INTRAVENOUS | Status: AC
  Filled 2018-10-11: qty 20

## 2018-10-11 MED ORDER — 0.9 % SODIUM CHLORIDE (POUR BTL) OPTIME
TOPICAL | Status: DC | PRN
  Administered 2018-10-11: 1000 mL

## 2018-10-11 MED ORDER — FENTANYL CITRATE (PF) 250 MCG/5ML IJ SOLN
INTRAMUSCULAR | Status: AC
  Filled 2018-10-11: qty 5

## 2018-10-11 MED ORDER — LACTATED RINGERS IV SOLN
INTRAVENOUS | Status: DC
  Administered 2018-10-11: 13:00:00 via INTRAVENOUS

## 2018-10-11 MED ORDER — PROMETHAZINE HCL 25 MG/ML IJ SOLN: 6.2500 mg | INTRAMUSCULAR | Status: DC | PRN

## 2018-10-11 MED ORDER — FENTANYL CITRATE (PF) 100 MCG/2ML IJ SOLN
INTRAMUSCULAR | Status: AC
  Filled 2018-10-11: qty 2

## 2018-10-11 SURGICAL SUPPLY — 28 items
BANDAGE EYE OVAL (MISCELLANEOUS) IMPLANT
CANISTER SUCT 3000ML PPV (MISCELLANEOUS) ×2 IMPLANT
CONT SPEC 4OZ CLIKSEAL STRL BL (MISCELLANEOUS) ×4 IMPLANT
COVER BACK TABLE 60X90IN (DRAPES) ×2 IMPLANT
COVER MAYO STAND STRL (DRAPES) ×2 IMPLANT
COVER WAND RF STERILE (DRAPES) IMPLANT
DRAPE HALF SHEET 40X57 (DRAPES) ×2 IMPLANT
DRESSING TELFA 8X10 (GAUZE/BANDAGES/DRESSINGS) IMPLANT
ELECT REM PT RETURN 9FT ADLT (ELECTROSURGICAL)
ELECTRODE REM PT RTRN 9FT ADLT (ELECTROSURGICAL) IMPLANT
GAUZE 4X4 16PLY RFD (DISPOSABLE) ×2 IMPLANT
GLOVE ECLIPSE 7.5 STRL STRAW (GLOVE) ×2 IMPLANT
GUARD TEETH (MISCELLANEOUS) IMPLANT
KIT BASIN OR (CUSTOM PROCEDURE TRAY) ×2 IMPLANT
KIT TURNOVER KIT B (KITS) ×2 IMPLANT
NEEDLE PRECISIONGLIDE 27X1.5 (NEEDLE) ×2 IMPLANT
NEEDLE SPNL 25GX3.5 QUINCKE BL (NEEDLE) IMPLANT
NS IRRIG 1000ML POUR BTL (IV SOLUTION) ×2 IMPLANT
PAD ARMBOARD 7.5X6 YLW CONV (MISCELLANEOUS) ×4 IMPLANT
PATTIES SURGICAL .5 X1 (DISPOSABLE) IMPLANT
SOLUTION ANTI FOG 6CC (MISCELLANEOUS) ×2 IMPLANT
SPECIMEN JAR SMALL (MISCELLANEOUS) ×2 IMPLANT
SYR 3ML LL SCALE MARK (SYRINGE) IMPLANT
SYR CONTROL 10ML LL (SYRINGE) IMPLANT
SYR TB 1ML LUER SLIP (SYRINGE) IMPLANT
TOWEL NATURAL 6PK STERILE (DISPOSABLE) ×2 IMPLANT
TUBE CONNECTING 12X1/4 (SUCTIONS) ×2 IMPLANT
WATER STERILE IRR 1000ML POUR (IV SOLUTION) ×2 IMPLANT

## 2018-10-11 NOTE — Discharge Instructions (Signed)
Avoid screaming, straining your voice, whispering.  Please try to avoid smoking as much as possible.

## 2018-10-11 NOTE — Anesthesia Postprocedure Evaluation (Signed)
Anesthesia Post Note  Patient: [name]  Procedure(s) Performed: MICROLARYNGOSCOPY with Biopsy (N/A Mouth)     Patient location during evaluation: PACU Anesthesia Type: General Level of consciousness: awake and alert, patient cooperative and oriented Pain management: pain level controlled Vital Signs Assessment: post-procedure vital signs reviewed and stable Respiratory status: spontaneous breathing, nonlabored ventilation and respiratory function stable Cardiovascular status: blood pressure returned to baseline and stable Postop Assessment: no apparent nausea or vomiting Anesthetic complications: no    Last Vitals:  Vitals:   10/11/18 1515 10/11/18 1516  BP: 134/68   Pulse: 71 (!) 59  Resp: 15 18  Temp: (!) 36.4 C   SpO2: 93% 99%    Last Pain:  Vitals:   10/11/18 1515  TempSrc:   PainSc: 0-No pain                 Lamyiah Crawshaw,E. Marcena Dias

## 2018-10-11 NOTE — Anesthesia Preprocedure Evaluation (Addendum)
Anesthesia Evaluation  Patient identified by MRN, date of birth, ID band Patient awake    Reviewed: Allergy & Precautions, NPO status , Patient's Chart, lab work & pertinent test results  History of Anesthesia Complications Negative for: history of anesthetic complications  Airway Mallampati: I  TM Distance: >3 FB Neck ROM: Full    Dental  (+) Edentulous Upper, Edentulous Lower   Pulmonary COPD, Current Smoker,  Chronic hoarseness   breath sounds clear to auscultation       Cardiovascular hypertension, Pt. on medications (-) angina Rhythm:Regular Rate:Normal     Neuro/Psych Anxiety Depression Chronic back pain    GI/Hepatic Neg liver ROS, GERD  Medicated and Controlled,  Endo/Other  obese  Renal/GU negative Renal ROS     Musculoskeletal   Abdominal (+) + obese,   Peds  Hematology   Anesthesia Other Findings   Reproductive/Obstetrics                            Anesthesia Physical Anesthesia Plan  ASA: III  Anesthesia Plan: General   Post-op Pain Management:    Induction: Intravenous  PONV Risk Score and Plan: 2  Airway Management Planned: Oral ETT  Additional Equipment:   Intra-op Plan:   Post-operative Plan: Extubation in OR  Informed Consent: I have reviewed the patients History and Physical, chart, labs and discussed the procedure including the risks, benefits and alternatives for the proposed anesthesia with the patient or authorized representative who has indicated his/her understanding and acceptance.       Plan Discussed with: CRNA and Surgeon  Anesthesia Plan Comments:        Anesthesia Quick Evaluation

## 2018-10-11 NOTE — Progress Notes (Signed)
Care of pt assumed by MA Landry Lookingbill RN from M. Brande RN 

## 2018-10-11 NOTE — Interval H&P Note (Signed)
History and Physical Interval Note:  10/11/2018 11:27 AM  [name]  has presented today for surgery, with the diagnosis of Chronic Hoarseness.  The various methods of treatment have been discussed with the patient and family. After consideration of risks, benefits and other options for treatment, the patient has consented to  Procedure(s): MICROLARYNGOSCOPY with possible Biopsy (N/A) as a surgical intervention.  The patient's history has been reviewed, patient examined, no change in status, stable for surgery.  I have reviewed the patient's chart and labs.  Questions were answered to the patient's satisfaction.     Serena Colonel

## 2018-10-11 NOTE — Op Note (Signed)
OPERATIVE REPORT  DATE OF SURGERY: 10/11/2018  PATIENT:  [name],  73 y.o. female  PRE-OPERATIVE DIAGNOSIS:  Chronic Hoarseness  POST-OPERATIVE DIAGNOSIS:   Bilateral vocal cord polyps  PROCEDURE:  Procedure(s): MICROLARYNGOSCOPY with Biopsy  SURGEON:  Susy Frizzle, MD  ASSISTANTS: None  ANESTHESIA:   General   EBL: 5 ml  DRAINS: None  LOCAL MEDICATIONS USED:  None  SPECIMEN: Right vocal cord mass, left vocal cord mass.  COUNTS:  Correct  PROCEDURE DETAILS: The patient was taken to the operating room and placed on the operating table in the supine position. Following induction of general endotracheal anesthesia, the table was turned 90 degrees and the patient was draped in a standard fashion.  Patient is edentulous so tooth protection was not necessary.  A Jako laryngoscope was used to evaluate the hypopharynx and larynx.  This was attached to the Mayo stand with the suspension apparatus.  But operating microscope was brought into view and used throughout the case.  Bilateral vocal polypoid masses were identified.  On the right side, there was some friability to the surface so there is concern about possible early malignancy.  The right side was simply removed using biopsy forceps.  On the left side a true micro-flap dissection was accomplished using a up-biting scissors to open along the superior aspect of the polyp from front to back and then suction was used to clear out the gelatinous material from within the polyp.  Excess mucosa was trimmed off with scissors.  This was sent for pathologic evaluation.  Topical adrenaline was used on both sides for hemostasis.  The scope was removed.  The patient was awakened extubated and transferred to recovery in stable condition.    PATIENT DISPOSITION:  To PACU, stable

## 2018-10-11 NOTE — Transfer of Care (Signed)
Immediate Anesthesia Transfer of Care Note  Patient: [name]  Procedure(s) Performed: MICROLARYNGOSCOPY with possible Biopsy (N/A )  Patient Location: PACU  Anesthesia Type:General  Level of Consciousness: awake, alert  and patient cooperative  Airway & Oxygen Therapy: Patient Spontanous Breathing and Patient connected to face mask oxygen  Post-op Assessment: Report given to RN and Post -op Vital signs reviewed and stable  Post vital signs: Reviewed and stable  Last Vitals:  Vitals Value Taken Time  BP 107/67 10/11/2018  2:14 PM  Temp    Pulse 72 10/11/2018  2:14 PM  Resp 16 10/11/2018  2:14 PM  SpO2 98 % 10/11/2018  2:14 PM  Vitals shown include unvalidated device data.  Last Pain:  Vitals:   10/11/18 1058  TempSrc:   PainSc: 0-No pain         Complications: No apparent anesthesia complications

## 2018-10-11 NOTE — Anesthesia Procedure Notes (Signed)
Procedure Name: Intubation Date/Time: 10/11/2018 1:43 PM Performed by: Modena Morrow, CRNA Pre-anesthesia Checklist: Patient identified, Emergency Drugs available, Suction available, Patient being monitored and Timeout performed Patient Re-evaluated:Patient Re-evaluated prior to induction Oxygen Delivery Method: Circle system utilized Preoxygenation: Pre-oxygenation with 100% oxygen Induction Type: IV induction, Rapid sequence and Cricoid Pressure applied Laryngoscope Size: Miller and 2 Grade View: Grade I Tube type: Oral Tube size: 7.0 mm Number of attempts: 1 Airway Equipment and Method: Patient positioned with wedge pillow Placement Confirmation: ETT inserted through vocal cords under direct vision,  positive ETCO2 and breath sounds checked- equal and bilateral Secured at: 22 cm Tube secured with: Tape Dental Injury: Teeth and Oropharynx as per pre-operative assessment

## 2018-10-11 NOTE — Progress Notes (Signed)
Alert/oriented x 4 and at baseline. Laughhing and joking with staff / report to m [employer]

## 2018-10-12 ENCOUNTER — Encounter (HOSPITAL_COMMUNITY): Payer: Self-pay | Admitting: Otolaryngology

## 2018-10-13 ENCOUNTER — Encounter (HOSPITAL_COMMUNITY): Payer: Self-pay | Admitting: Emergency Medicine

## 2018-10-13 ENCOUNTER — Emergency Department (HOSPITAL_COMMUNITY)
Admission: EM | Admit: 2018-10-13 | Discharge: 2018-10-13 | Disposition: A | Discharge status: Home / Self Care | Payer: Medicare HMO | Attending: Emergency Medicine | Admitting: Emergency Medicine

## 2018-10-13 ENCOUNTER — Other Ambulatory Visit: Payer: Self-pay

## 2018-10-13 DIAGNOSIS — I10 Essential (primary) hypertension: Secondary | ICD-10-CM | POA: Insufficient documentation

## 2018-10-13 DIAGNOSIS — K5900 Constipation, unspecified: Secondary | ICD-10-CM | POA: Diagnosis present

## 2018-10-13 DIAGNOSIS — J449 Chronic obstructive pulmonary disease, unspecified: Secondary | ICD-10-CM | POA: Diagnosis not present

## 2018-10-13 DIAGNOSIS — F17210 Nicotine dependence, cigarettes, uncomplicated: Secondary | ICD-10-CM | POA: Insufficient documentation

## 2018-10-13 DIAGNOSIS — K5641 Fecal impaction: Secondary | ICD-10-CM | POA: Diagnosis not present

## 2018-10-13 MED ORDER — BISACODYL 10 MG RE SUPP: 10.0000 mg | RECTAL | 0 refills | Status: DC | PRN

## 2018-10-13 NOTE — ED Provider Notes (Signed)
[facility] Emergency Department Provider Note MRN:  [id number]  Arrival date & time: 10/13/18     Chief Complaint   Constipation and Hemorrhoids   History of Present Illness   [name] is a 73 y.o. year-old female with a history of GERD, hypertension, COPD presenting to the ED with chief complaint of constipation.  Nearly 1 week of no bowel movements.  Patient explains that she has suffered with constipation for most of her life.  She had a laryngoscopic procedure performed last week and she thinks that the general anesthesia made it worse.  Endorsing an overall bloated sensation, but denies abdominal pain.  No fever, no vomiting, no chest pain or shortness of breath.  Feels that she has a dense firm stool that she cannot pass.  Review of Systems  A complete 10 system review of systems was obtained and all systems are negative except as noted in the HPI and PMH.   Patient's Health History    Past Medical History:  Diagnosis Date  . Anxiety   . Bronchitis due to tobacco use    quit smoking 3 months ago  . Chronic sciatica of left side 04/01/2015  . COPD (chronic obstructive pulmonary disease) (HCC)   . Depression   . GERD (gastroesophageal reflux disease)   . Hypertension   . Mitral valve prolapse   . Pneumonia    years ago  . Polio    age 8 years old    Past Surgical History:  Procedure Laterality Date  . ABDOMINAL HYSTERECTOMY  1985  . BACK SURGERY    . COLONOSCOPY  2017  . LUMBAR LAMINECTOMY Left 02/21/2014   Procedure: Left L5-S1 Hemilaminectomy, Lateral Recess Decompression, Removal Synovial Cyst;  Surgeon: Eldred Manges, MD;  Location: [facility] OR;  Service: Orthopedics;  Laterality: Left;  Marland Kitchen MICROLARYNGOSCOPY N/A 10/11/2018   Procedure: MICROLARYNGOSCOPY with Biopsy;  Surgeon: Serena Colonel, MD;  Location: [facility] OR;  Service: ENT;  Laterality: N/A;  . polio Bilateral 1950's   surgery on feet and legs for polio  . POLIO SURGERY    . TONSILLECTOMY       Family History  Problem Relation Age of Onset  . Cancer Mother        thyroid  . Heart attack Father   . Cancer Sister        breast  . Kidney disease Sister        ESRD  . Irritable bowel syndrome Sister   . Cancer Sister        thyroid  . Colon cancer Neg Hx   . Esophageal cancer Neg Hx     Social History   Socioeconomic History  . Marital status: Married    Spouse name: [name]  . Number of children: 1  . Years of education: College  . Highest education level: Not on file  Occupational History  . Occupation: Retired    Associate Professor: [employer]  Social Needs  . Financial resource strain: Not on file  . Food insecurity:    Worry: Not on file    Inability: Not on file  . Transportation needs:    Medical: Not on file    Non-medical: Not on file  Tobacco Use  . Smoking status: Current Every Day Smoker    Packs/day: 1.50    Years: 54.00    Pack years: 81.00    Types: Cigarettes  . Smokeless tobacco: Never Used  . Tobacco comment: currently 2 packs per day  Substance and Sexual Activity  . Alcohol use: Yes    Alcohol/week: 0.0 standard drinks    Comment: occasional  . Drug use: No  . Sexual activity: Not on file  Lifestyle  . Physical activity:    Days per week: Not on file    Minutes per session: Not on file  . Stress: Not on file  Relationships  . Social connections:    Talks on phone: Not on file    Gets together: Not on file    Attends religious service: Not on file    Active member of club or organization: Not on file    Attends meetings of clubs or organizations: Not on file    Relationship status: Not on file  . Intimate partner violence:    Fear of current or ex partner: Not on file    Emotionally abused: Not on file    Physically abused: Not on file    Forced sexual activity: Not on file  Other Topics Concern  . Not on file  Social History Narrative   Patient lives at home with her spouse.   Caffeine Use: daily     Physical Exam  Vital Signs and  Nursing Notes reviewed Vitals:   10/13/18 1050 10/13/18 1131  BP: 126/73 132/68  Pulse: 80 67  Resp: (!) 23   Temp: 97.8 F (36.6 C)   SpO2: 100% 100%    CONSTITUTIONAL: Well-appearing, NAD NEURO:  Alert and oriented x 3, no focal deficits EYES:  eyes equal and reactive ENT/NECK:  no LAD, no JVD CARDIO: Regular rate, well-perfused, normal S1 and S2 PULM:  CTAB no wheezing or rhonchi GI/GU:  normal bowel sounds, non-distended, non-tender; no evidence of external hemorrhoids, normal rectal tone, no gross blood, large dense stool in rectal vault MSK/SPINE:  No gross deformities, no edema SKIN:  no rash, atraumatic PSYCH:  Appropriate speech and behavior  Diagnostic and Interventional Summary    Labs Reviewed - No data to display  No orders to display    Medications - No data to display   Fecal disimpaction Date/Time: 10/13/2018 11:47 AM Performed by: Sabas SousBero, Jonanthan Bolender M, MD Authorized by: Sabas SousBero, Tashaun Obey M, MD  Consent: Verbal consent obtained. Risks and benefits: risks, benefits and alternatives were discussed Consent given by: patient Patient understanding: patient states understanding of the procedure being performed Patient identity confirmed: verbally with patient Time out: Immediately prior to procedure a "time out" was called to verify the correct patient, procedure, equipment, support staff and site/side marked as required. Local anesthesia used: no  Anesthesia: Local anesthesia used: no  Sedation: Patient sedated: no  Patient tolerance: Patient tolerated the procedure well with no immediate complications Comments: Large solid stool ball present in rectal vault, seated too highly for significant extraction.  Stool was loosened from the rectal wall and procedure was terminated upon patient request due to discomfort.    Critical Care  ED Course and Medical Decision Making  I have reviewed the triage vital signs and the nursing notes.  Pertinent labs & imaging  results that were available during my care of the patient were reviewed by me and considered in my medical decision making (see below for details).  Normal vital signs, abdomen soft and nontender, will attempt disimpaction and reassess.  Currently with low concern for significant intra-abdominal pathology or obstruction.  Disimpaction attempt described above.  Given the clear presence of impaction, appropriate for discharge with more aggressive stool regimen.  Patient has not tried enemas or  suppositories, will provide upon discharge.  After the attempted disimpaction, patient was able to have a successful large bowel movement here in the emergency department.  She is feeling much better and is requesting discharge.  Provided with suppositories and advised to continue to take MiraLAX for any continued constipation.  After the discussed management above, the patient was determined to be safe for discharge.  The patient was in agreement with this plan and all questions regarding their care were answered.  ED return precautions were discussed and the patient will return to the ED with any significant worsening of condition.  Elmer Sow. Pilar Plate, MD [facility] Emergency [facility] [email or web address]  Final Clinical Impressions(s) / ED Diagnoses     ICD-10-CM   1. Fecal impaction [facility]) K56.41     ED Discharge Orders         Ordered    bisacodyl (DULCOLAX) 10 MG suppository  As needed     10/13/18 1208             Sabas Sous, MD 10/13/18 1210

## 2018-10-13 NOTE — ED Notes (Signed)
Pt had large BM.  

## 2018-10-13 NOTE — Discharge Instructions (Addendum)
You were evaluated in the Emergency Department and after careful evaluation, we did not find any emergent condition requiring admission or further testing in the hospital.  Your symptoms today seem to be due to a fecal impaction, which we were able to help you pass here in the emergency department.  You still likely have a significant degree of constipation.  As discussed, take MiraLAX throughout the day to help soften your stools.  You can also use the suppositories provided as needed.  Please return to the Emergency Department if you experience any worsening of your condition.  We encourage you to follow up with a primary care provider.  Thank you for allowing [place] to be a part of your care.

## 2018-10-13 NOTE — ED Triage Notes (Signed)
Pt in with constipation and hemorrhoid pain x 1.5 wks. States she has taken multiple laxatives with no relief, states pain radiates to L back

## 2018-11-06 DIAGNOSIS — G4700 Insomnia, unspecified: Secondary | ICD-10-CM | POA: Diagnosis not present

## 2018-11-29 DIAGNOSIS — F17210 Nicotine dependence, cigarettes, uncomplicated: Secondary | ICD-10-CM | POA: Diagnosis not present

## 2018-11-29 DIAGNOSIS — J381 Polyp of vocal cord and larynx: Secondary | ICD-10-CM | POA: Diagnosis not present

## 2018-11-29 DIAGNOSIS — K219 Gastro-esophageal reflux disease without esophagitis: Secondary | ICD-10-CM | POA: Diagnosis not present

## 2018-11-29 DIAGNOSIS — R490 Dysphonia: Secondary | ICD-10-CM | POA: Diagnosis not present

## 2018-12-18 DIAGNOSIS — G4700 Insomnia, unspecified: Secondary | ICD-10-CM | POA: Diagnosis not present

## 2019-01-29 DIAGNOSIS — G4700 Insomnia, unspecified: Secondary | ICD-10-CM | POA: Diagnosis not present

## 2019-02-11 DIAGNOSIS — R5383 Other fatigue: Secondary | ICD-10-CM | POA: Diagnosis not present

## 2019-02-11 DIAGNOSIS — F419 Anxiety disorder, unspecified: Secondary | ICD-10-CM | POA: Diagnosis not present

## 2019-02-11 DIAGNOSIS — K219 Gastro-esophageal reflux disease without esophagitis: Secondary | ICD-10-CM | POA: Diagnosis not present

## 2019-02-11 DIAGNOSIS — M5442 Lumbago with sciatica, left side: Secondary | ICD-10-CM | POA: Diagnosis not present

## 2019-02-11 DIAGNOSIS — E7800 Pure hypercholesterolemia, unspecified: Secondary | ICD-10-CM | POA: Diagnosis not present

## 2019-02-11 DIAGNOSIS — G4700 Insomnia, unspecified: Secondary | ICD-10-CM | POA: Diagnosis not present

## 2019-02-11 DIAGNOSIS — J439 Emphysema, unspecified: Secondary | ICD-10-CM | POA: Diagnosis not present

## 2019-02-11 DIAGNOSIS — J449 Chronic obstructive pulmonary disease, unspecified: Secondary | ICD-10-CM | POA: Diagnosis not present

## 2019-02-11 DIAGNOSIS — I10 Essential (primary) hypertension: Secondary | ICD-10-CM | POA: Diagnosis not present

## 2019-02-11 DIAGNOSIS — Z8612 Personal history of poliomyelitis: Secondary | ICD-10-CM | POA: Diagnosis not present

## 2019-02-25 DIAGNOSIS — E7800 Pure hypercholesterolemia, unspecified: Secondary | ICD-10-CM | POA: Diagnosis not present

## 2019-04-12 DIAGNOSIS — Z1231 Encounter for screening mammogram for malignant neoplasm of breast: Secondary | ICD-10-CM | POA: Diagnosis not present

## 2019-04-12 DIAGNOSIS — Z803 Family history of malignant neoplasm of breast: Secondary | ICD-10-CM | POA: Diagnosis not present

## 2019-04-16 ENCOUNTER — Ambulatory Visit (INDEPENDENT_AMBULATORY_CARE_PROVIDER_SITE_OTHER): Payer: Medicare HMO | Admitting: Orthopaedic Surgery

## 2019-04-16 ENCOUNTER — Other Ambulatory Visit: Payer: Self-pay

## 2019-04-16 ENCOUNTER — Ambulatory Visit (INDEPENDENT_AMBULATORY_CARE_PROVIDER_SITE_OTHER): Payer: Medicare HMO

## 2019-04-16 ENCOUNTER — Encounter: Payer: Self-pay | Admitting: Orthopaedic Surgery

## 2019-04-16 DIAGNOSIS — M25562 Pain in left knee: Secondary | ICD-10-CM

## 2019-04-16 NOTE — Progress Notes (Signed)
Office Visit Note   Patient: [name]           Date of Birth: 01-Dec-1945           MRN: [id number] Visit Date: 04/16/2019              Requested by: Antony Contras, MD [place],   [id number] PCP: Antony Contras, MD   Assessment & Plan: Visit Diagnoses:  1. Acute pain of left knee     Plan: Latest MRI scan is reviewed with patient pathophysiology discussed.  Other than the abrasion over left knee after fall plain radiographs are negative for fracture.  She should get resolution with conservative treatment.  Follow-up as needed. Follow-Up Instructions: No follow-ups on file.   Orders:  Orders Placed This Encounter  Procedures  . XR Knee 1-2 Views Left   No orders of the defined types were placed in this encounter.     Procedures: No procedures performed   Clinical Data: No additional findings.   Subjective: Chief Complaint  Patient presents with  . Left Knee - Pain, Injury    HPI 73 year old female returns with left knee pain after she slipped and fell on Sunday she has abrasions over the tibial tubercle she states she stumbled over a curb.  Past history of polio with right leg shorter than left.  Patient is used to ice to manage the swelling no medications.  She still has some ongoing burning pain down to her ankle after previous synovial cyst excision at L5-S1.  MRIs after surgery were negative for cyst recurrence.  Most recent MRI last year showed some moderate narrowing at L3-4 and moderate narrowing at L4-5.  Her back pain syndrome tends to be constant.  She does states she is not able to stand longer walk far.  Review of Systems reviewed updated unchanged from last office visit.  Polio as a child with leg length inequality.   Objective: Vital Signs: There were no vitals taken for this visit.  Physical Exam Constitutional:      Appearance: She is well-developed.  HENT:     Head: Normocephalic.     Right Ear: External ear  normal.     Left Ear: External ear normal.  Eyes:     Pupils: Pupils are equal, round, and reactive to light.  Neck:     Thyroid: No thyromegaly.     Trachea: No tracheal deviation.  Cardiovascular:     Rate and Rhythm: Normal rate.  Pulmonary:     Effort: Pulmonary effort is normal.  Abdominal:     Palpations: Abdomen is soft.  Skin:    General: Skin is warm and dry.  Neurological:     Mental Status: She is alert and oriented to person, place, and time.  Psychiatric:        Behavior: Behavior normal.     Ortho Exam superficial abrasion present over tibial tubercle with eschar.  Collateral ligaments are stable no knee effusion is noted minimal crepitus with knee flexion extension.  ACL PCL exam is normal.  Trace subcutaneous edema slight bruising adjacent to the abrasion.  Ankle range of motion is full.  Some pain with straight leg raising 90 degrees.  Normal logroll to the hips.  Leg length inequality noted again.  Specialty Comments:  No specialty comments available.  Imaging: Xr Knee 1-2 Views Left  Result Date: 04/16/2019 AP lateral left knee x-rays obtained and reviewed.  This shows leg length  inequality unchanged from previous imaging.  Negative for acute injury post fall.  No significant degenerative knee changes. Impression: Normal left knee x-rays.    PMFS History: Patient Active Problem List   Diagnosis Date Noted  . Chronic sciatica of left side 04/01/2015  . HNP (herniated nucleus pulposus), lumbar 02/21/2014  . Synovial cyst of lumbar spine 02/21/2014  . HNP (herniated nucleus pulposus) 02/21/2014   Past Medical History:  Diagnosis Date  . Anxiety   . Bronchitis due to tobacco use    quit smoking 3 months ago  . Chronic sciatica of left side 04/01/2015  . COPD (chronic obstructive pulmonary disease) (HCC)   . Depression   . GERD (gastroesophageal reflux disease)   . Hypertension   . Mitral valve prolapse   . Pneumonia    years ago  . Polio    age 22  years old    Family History  Problem Relation Age of Onset  . Cancer Mother        thyroid  . Heart attack Father   . Cancer Sister        breast  . Kidney disease Sister        ESRD  . Irritable bowel syndrome Sister   . Cancer Sister        thyroid  . Colon cancer Neg Hx   . Esophageal cancer Neg Hx     Past Surgical History:  Procedure Laterality Date  . ABDOMINAL HYSTERECTOMY  1985  . BACK SURGERY    . COLONOSCOPY  2017  . LUMBAR LAMINECTOMY Left 02/21/2014   Procedure: Left L5-S1 Hemilaminectomy, Lateral Recess Decompression, Removal Synovial Cyst;  Surgeon: Eldred Manges, MD;  Location: [facility] OR;  Service: Orthopedics;  Laterality: Left;  Marland Kitchen MICROLARYNGOSCOPY N/A 10/11/2018   Procedure: MICROLARYNGOSCOPY with Biopsy;  Surgeon: Serena Colonel, MD;  Location: [facility] OR;  Service: ENT;  Laterality: N/A;  . polio Bilateral 1950's   surgery on feet and legs for polio  . POLIO SURGERY    . TONSILLECTOMY     Social History   Occupational History  . Occupation: Retired    Associate Professor: [employer]  Tobacco Use  . Smoking status: Current Every Day Smoker    Packs/day: 1.50    Years: 54.00    Pack years: 81.00    Types: Cigarettes  . Smokeless tobacco: Never Used  . Tobacco comment: currently 2 packs per day  Substance and Sexual Activity  . Alcohol use: Yes    Alcohol/week: 0.0 standard drinks    Comment: occasional  . Drug use: No  . Sexual activity: Not on file

## 2019-04-23 DIAGNOSIS — H524 Presbyopia: Secondary | ICD-10-CM | POA: Diagnosis not present

## 2019-04-23 DIAGNOSIS — H5201 Hypermetropia, right eye: Secondary | ICD-10-CM | POA: Diagnosis not present

## 2019-04-23 DIAGNOSIS — H52203 Unspecified astigmatism, bilateral: Secondary | ICD-10-CM | POA: Diagnosis not present

## 2019-05-09 DIAGNOSIS — H10502 Unspecified blepharoconjunctivitis, left eye: Secondary | ICD-10-CM | POA: Diagnosis not present

## 2019-06-12 DIAGNOSIS — F324 Major depressive disorder, single episode, in partial remission: Secondary | ICD-10-CM | POA: Diagnosis not present

## 2019-06-12 DIAGNOSIS — I10 Essential (primary) hypertension: Secondary | ICD-10-CM | POA: Diagnosis not present

## 2019-06-12 DIAGNOSIS — E7800 Pure hypercholesterolemia, unspecified: Secondary | ICD-10-CM | POA: Diagnosis not present

## 2019-06-12 DIAGNOSIS — J449 Chronic obstructive pulmonary disease, unspecified: Secondary | ICD-10-CM | POA: Diagnosis not present

## 2019-06-13 DIAGNOSIS — G4700 Insomnia, unspecified: Secondary | ICD-10-CM | POA: Diagnosis not present

## 2019-07-05 DIAGNOSIS — Z720 Tobacco use: Secondary | ICD-10-CM | POA: Diagnosis not present

## 2019-07-05 DIAGNOSIS — K219 Gastro-esophageal reflux disease without esophagitis: Secondary | ICD-10-CM | POA: Diagnosis not present

## 2019-07-05 DIAGNOSIS — Z0000 Encounter for general adult medical examination without abnormal findings: Secondary | ICD-10-CM | POA: Diagnosis not present

## 2019-07-05 DIAGNOSIS — J449 Chronic obstructive pulmonary disease, unspecified: Secondary | ICD-10-CM | POA: Diagnosis not present

## 2019-07-05 DIAGNOSIS — M545 Low back pain: Secondary | ICD-10-CM | POA: Diagnosis not present

## 2019-07-05 DIAGNOSIS — N183 Chronic kidney disease, stage 3 unspecified: Secondary | ICD-10-CM | POA: Diagnosis not present

## 2019-07-05 DIAGNOSIS — I10 Essential (primary) hypertension: Secondary | ICD-10-CM | POA: Diagnosis not present

## 2019-07-05 DIAGNOSIS — G4700 Insomnia, unspecified: Secondary | ICD-10-CM | POA: Diagnosis not present

## 2019-07-05 DIAGNOSIS — E7800 Pure hypercholesterolemia, unspecified: Secondary | ICD-10-CM | POA: Diagnosis not present

## 2019-07-05 DIAGNOSIS — F419 Anxiety disorder, unspecified: Secondary | ICD-10-CM | POA: Diagnosis not present

## 2019-07-05 DIAGNOSIS — Z8612 Personal history of poliomyelitis: Secondary | ICD-10-CM | POA: Diagnosis not present

## 2019-07-05 DIAGNOSIS — Z1211 Encounter for screening for malignant neoplasm of colon: Secondary | ICD-10-CM | POA: Diagnosis not present

## 2019-07-06 ENCOUNTER — Ambulatory Visit: Payer: Medicare HMO | Attending: Internal Medicine

## 2019-07-06 DIAGNOSIS — Z23 Encounter for immunization: Secondary | ICD-10-CM

## 2019-07-06 NOTE — Progress Notes (Signed)
   Covid-19 Vaccination Clinic  Name:  [name]    MRN: [id number] DOB: 30-May-1945  07/06/2019  [name] was observed post Covid-19 immunization for 15 minutes without incidence. She was provided with Vaccine Information Sheet and instruction to access the V-Safe system.   [name] was instructed to call 911 with any severe reactions post vaccine: Marland Kitchen Difficulty breathing  . Swelling of your face and throat  . A fast heartbeat  . A bad rash all over your body  . Dizziness and weakness    Immunizations Administered    Name Date Dose VIS Date Route   Pfizer COVID-19 Vaccine 07/06/2019  1:33 PM 0.3 mL 05/03/2019 Intramuscular   Manufacturer: [employer], [employer]   Lot: [id number]   NDC: [address]-7

## 2019-07-30 ENCOUNTER — Ambulatory Visit: Payer: Medicare HMO | Attending: Internal Medicine

## 2019-07-30 DIAGNOSIS — Z23 Encounter for immunization: Secondary | ICD-10-CM | POA: Insufficient documentation

## 2019-07-30 NOTE — Progress Notes (Signed)
   Covid-19 Vaccination Clinic  Name:  [name]    MRN: [id number] DOB: 04-24-1946  07/30/2019  [name] was observed post Covid-19 immunization for 15 minutes without incident. She was provided with Vaccine Information Sheet and instruction to access the V-Safe system.   [name] was instructed to call 911 with any severe reactions post vaccine: Marland Kitchen Difficulty breathing  . Swelling of face and throat  . A fast heartbeat  . A bad rash all over body  . Dizziness and weakness   Immunizations Administered    Name Date Dose VIS Date Bennett   Pfizer COVID-19 Vaccine 07/30/2019  1:01 PM 0.3 mL 05/03/2019 Intramuscular   Manufacturer: [employer], [employer]   Lot: [id number]   NDC: [address]-3

## 2019-08-08 ENCOUNTER — Other Ambulatory Visit: Payer: Self-pay | Admitting: Orthopaedic Surgery

## 2019-08-08 ENCOUNTER — Telehealth: Payer: Self-pay | Admitting: Orthopaedic Surgery

## 2019-08-08 MED ORDER — PREDNISONE 5 MG (21) PO TBPK: ORAL_TABLET | ORAL | 0 refills | Status: DC

## 2019-08-08 NOTE — Telephone Encounter (Signed)
Please advise 

## 2019-08-08 NOTE — Telephone Encounter (Signed)
FYI I called discussed sent in prednisone 5mg  dose pack .

## 2019-08-08 NOTE — Telephone Encounter (Signed)
noted 

## 2019-08-08 NOTE — Telephone Encounter (Signed)
Patient called requesting a new medication of prednisone. Please send to pharmacy on file. Patient phone number is [phone].

## 2019-08-14 DIAGNOSIS — N183 Chronic kidney disease, stage 3 unspecified: Secondary | ICD-10-CM | POA: Diagnosis not present

## 2019-08-14 DIAGNOSIS — I10 Essential (primary) hypertension: Secondary | ICD-10-CM | POA: Diagnosis not present

## 2019-08-14 DIAGNOSIS — G4700 Insomnia, unspecified: Secondary | ICD-10-CM | POA: Diagnosis not present

## 2019-08-14 DIAGNOSIS — E7800 Pure hypercholesterolemia, unspecified: Secondary | ICD-10-CM | POA: Diagnosis not present

## 2019-08-14 DIAGNOSIS — J449 Chronic obstructive pulmonary disease, unspecified: Secondary | ICD-10-CM | POA: Diagnosis not present

## 2019-08-14 DIAGNOSIS — F324 Major depressive disorder, single episode, in partial remission: Secondary | ICD-10-CM | POA: Diagnosis not present

## 2019-08-21 NOTE — Telephone Encounter (Signed)
disregard

## 2019-08-27 ENCOUNTER — Ambulatory Visit (INDEPENDENT_AMBULATORY_CARE_PROVIDER_SITE_OTHER): Payer: Medicare HMO

## 2019-08-27 ENCOUNTER — Encounter: Payer: Self-pay | Admitting: Orthopaedic Surgery

## 2019-08-27 ENCOUNTER — Other Ambulatory Visit: Payer: Self-pay

## 2019-08-27 ENCOUNTER — Ambulatory Visit: Payer: Medicare HMO | Admitting: Orthopaedic Surgery

## 2019-08-27 VITALS — BP 107/67 | HR 76 | Ht 62.0 in | Wt 171.6 lb

## 2019-08-27 DIAGNOSIS — G8929 Other chronic pain: Secondary | ICD-10-CM

## 2019-08-27 DIAGNOSIS — M545 Low back pain, unspecified: Secondary | ICD-10-CM

## 2019-08-27 DIAGNOSIS — M4807 Spinal stenosis, lumbosacral region: Secondary | ICD-10-CM | POA: Diagnosis not present

## 2019-08-27 NOTE — Progress Notes (Signed)
Office Visit Note   Patient: [name]           Date of Birth: 07/08/1945           MRN: [id number] Visit Date: 08/27/2019              Requested by: Tally Joe, MD [phone] [address] [place],  [place] [address] PCP: Tally Joe, MD   Assessment & Plan: Visit Diagnoses:  1. Chronic left-sided low back pain, unspecified whether sciatica present     Plan: We will proceed with MRI scan lumbar for evaluation of progression of L3-4 spinal stenosis greater than previous moderate degree in 2019.  Office follow-up after scan for review.  She can have a plain MRI scan without contrast.  Follow-Up Instructions: No follow-ups on file.   Orders:  Orders Placed This Encounter  Procedures  . XR Lumbar Spine 2-3 Views   No orders of the defined types were placed in this encounter.     Procedures: No procedures performed   Clinical Data: No additional findings.   Subjective: Chief Complaint  Patient presents with  . Left Leg - Pain    HPI 74 year old female returns with ongoing problems with back pain pain rating down her left leg with difficulty walking.  She is using a cane and has numbness and tingling in her leg.  She is taking medications in the past without relief including gabapentin, muscle relaxants.  She has been through therapy without improvement.  Previous MRI scan showed some moderate stenosis at L3-4.  She had previous surgery at L5-S1 with decompression and removal of synovial cyst.  There has been no recurrent compression at L5-S1 by scan 2019.  Patient states she cannot stand the pain much longer and is requesting repeat imaging.  In the past she has had epidurals which did not give her sustained relief.  She has had limited narcotic pain medication occasionally Tylenol 3 in the past.  Patient states she cannot stand more than 5 to 10 minutes she has trouble cooking a meal.  She does not get complete relief with sitting but does do better get pain  relief after being supine for 15 to 30 minutes.  Review of Systems past history of polio as a child with leg length inequality previous L5-S1 decompression.  Current neurogenic claudication symptoms with left greater than right leg pain.   Objective: Vital Signs: BP 107/67   Pulse 76   Ht 5\' 2"  (1.575 m)   Wt 171 lb 9.6 oz (77.8 kg)   BMI 31.39 kg/m   Physical Exam Constitutional:      Appearance: She is well-developed.  HENT:     Head: Normocephalic.     Right Ear: External ear normal.     Left Ear: External ear normal.  Eyes:     Pupils: Pupils are equal, round, and reactive to light.  Neck:     Thyroid: No thyromegaly.     Trachea: No tracheal deviation.  Cardiovascular:     Rate and Rhythm: Normal rate.  Pulmonary:     Effort: Pulmonary effort is normal.  Abdominal:     Palpations: Abdomen is soft.  Skin:    General: Skin is warm and dry.  Neurological:     Mental Status: She is alert and oriented to person, place, and time.  Psychiatric:        Behavior: Behavior normal.     Ortho Exam patient has negative logroll's right and  left hip.  No rash over exposed skin.  Good ankle range of motion anterior tib gastrocsoleus is strong she can heel and toe walk.  Well-healed lumbar incision and L5-S1.  Specialty Comments:  No specialty comments available.  Imaging: No results found.   PMFS History: Patient Active Problem List   Diagnosis Date Noted  . Chronic sciatica of left side 04/01/2015  . HNP (herniated nucleus pulposus), lumbar 02/21/2014  . Synovial cyst of lumbar spine 02/21/2014  . HNP (herniated nucleus pulposus) 02/21/2014   Past Medical History:  Diagnosis Date  . Anxiety   . Bronchitis due to tobacco use    quit smoking 3 months ago  . Chronic sciatica of left side 04/01/2015  . COPD (chronic obstructive pulmonary disease) ([place])   . Depression   . GERD (gastroesophageal reflux disease)   . Hypertension   . Mitral valve prolapse   .  Pneumonia    years ago  . 43    age 63 years old    Family History  Problem Relation Age of Onset  . Cancer Mother        thyroid  . Heart attack Father   . Cancer Sister        breast  . Kidney disease Sister        ESRD  . Irritable bowel syndrome Sister   . Cancer Sister        thyroid  . Colon cancer Neg Hx   . Esophageal cancer Neg Hx     Past Surgical History:  Procedure Laterality Date  . ABDOMINAL HYSTERECTOMY  1985  . BACK SURGERY    . COLONOSCOPY  2017  . LUMBAR LAMINECTOMY Left 02/21/2014   Procedure: Left L5-S1 Hemilaminectomy, Lateral Recess Decompression, Removal Synovial Cyst;  Surgeon: Marybelle Killings, MD;  Location: [facility];  Service: Orthopedics;  Laterality: Left;  Marland Kitchen MICROLARYNGOSCOPY N/A 10/11/2018   Procedure: MICROLARYNGOSCOPY with Biopsy;  Surgeon: Izora Gala, MD;  Location: [facility];  Service: ENT;  Laterality: N/A;  . polio Bilateral 1950's   surgery on feet and legs for polio  . POLIO SURGERY    . TONSILLECTOMY     Social History   Occupational History  . Occupation: Retired    Fish farm manager: [employer]  Tobacco Use  . Smoking status: Current Every Day Smoker    Packs/day: 1.50    Years: 54.00    Pack years: 81.00    Types: Cigarettes  . Smokeless tobacco: Never Used  . Tobacco comment: currently 2 packs per day  Substance and Sexual Activity  . Alcohol use: Yes    Alcohol/week: 0.0 standard drinks    Comment: occasional  . Drug use: No  . Sexual activity: Not on file

## 2019-09-09 ENCOUNTER — Telehealth: Payer: Self-pay

## 2019-09-09 NOTE — Telephone Encounter (Signed)
Message left notifying patient that it is time to schedule the low dose lung cancer screening CT scan.  Instructed patient to return call to Shawn Perkins at [phone] to verify information prior to CT scan being scheduled.    

## 2019-09-10 DIAGNOSIS — N183 Chronic kidney disease, stage 3 unspecified: Secondary | ICD-10-CM | POA: Diagnosis not present

## 2019-09-10 DIAGNOSIS — G4700 Insomnia, unspecified: Secondary | ICD-10-CM | POA: Diagnosis not present

## 2019-09-10 DIAGNOSIS — J449 Chronic obstructive pulmonary disease, unspecified: Secondary | ICD-10-CM | POA: Diagnosis not present

## 2019-09-10 DIAGNOSIS — I10 Essential (primary) hypertension: Secondary | ICD-10-CM | POA: Diagnosis not present

## 2019-09-10 DIAGNOSIS — E7800 Pure hypercholesterolemia, unspecified: Secondary | ICD-10-CM | POA: Diagnosis not present

## 2019-09-10 DIAGNOSIS — F324 Major depressive disorder, single episode, in partial remission: Secondary | ICD-10-CM | POA: Diagnosis not present

## 2019-09-17 ENCOUNTER — Ambulatory Visit
Admission: RE | Admit: 2019-09-17 | Discharge: 2019-09-17 | Disposition: A | Discharge status: Home / Self Care | Payer: Medicare HMO | Source: Ambulatory Visit | Attending: Orthopaedic Surgery | Admitting: Orthopaedic Surgery

## 2019-09-17 DIAGNOSIS — M48061 Spinal stenosis, lumbar region without neurogenic claudication: Secondary | ICD-10-CM | POA: Diagnosis not present

## 2019-09-17 DIAGNOSIS — M4807 Spinal stenosis, lumbosacral region: Secondary | ICD-10-CM

## 2019-09-20 ENCOUNTER — Encounter: Payer: Self-pay | Admitting: Orthopaedic Surgery

## 2019-09-20 ENCOUNTER — Other Ambulatory Visit: Payer: Self-pay

## 2019-09-20 ENCOUNTER — Ambulatory Visit: Payer: Medicare HMO | Admitting: Orthopaedic Surgery

## 2019-09-20 VITALS — Ht 62.0 in | Wt 171.0 lb

## 2019-09-20 DIAGNOSIS — M545 Low back pain, unspecified: Secondary | ICD-10-CM

## 2019-09-20 DIAGNOSIS — M48061 Spinal stenosis, lumbar region without neurogenic claudication: Secondary | ICD-10-CM

## 2019-09-20 DIAGNOSIS — G8929 Other chronic pain: Secondary | ICD-10-CM | POA: Diagnosis not present

## 2019-09-20 MED ORDER — PREGABALIN 50 MG PO CAPS: ORAL_CAPSULE | ORAL | 0 refills | Status: DC

## 2019-09-20 NOTE — Progress Notes (Signed)
Office Visit Note   Patient: [name]           Date of Birth: August 25, 1945           MRN: [id number] Visit Date: 09/20/2019              Requested by: Tally Joe, MD [phone] [place],  [place] [address] PCP: Tally Joe, MD   Assessment & Plan: Visit Diagnoses:  1. Chronic left-sided low back pain, unspecified whether sciatica present   2. Lumbar foraminal stenosis     Plan: We reviewed the MRI scan L3-4 stenosis and is stable which is still at moderate level.  She is having pain left side which may be related to the L4-L5 foraminal stenosis.  She has a disc fusion L4-5.  We discussed possible fusion but her next level up would be loaded and currently L3-4 shows moderate stenosis and she also has degenerative changes at the L2-3 level as well.  We will start some Lyrica 50 mg and set her up for an epidural with Dr. Alvester Morin on the left at L4 and also at L5 level foraminal if he feels like this would be the best choice.  Follow-Up Instructions: No follow-ups on file.   Orders:  Orders Placed This Encounter  Procedures  . Ambulatory referral to Physical Medicine Rehab   Meds ordered this encounter  Medications  . pregabalin (LYRICA) 50 MG capsule    Sig: Take one at night for one week then one twice daily    Dispense:  40 capsule    Refill:  0      Procedures: No procedures performed   Clinical Data: No additional findings.   Subjective: Chief Complaint  Patient presents with  . Lower Back - Follow-up, Pain    MRI Lumbar Review    HPI 74 year old female returns with ongoing problems with back pain.  Today she states she is having more pain but always has some level of pain.  She ambulates with a cane.  Previous 2015 left L5 hemilaminectomy lateral recess decompression and removal of synovial cyst. MRI scan has been obtained and is available for review.  Prednisone last month gave her good relief of symptoms while she was taking it and  recurrence after it ran out.  MRI scan shows previous laminectomy left L5-S1 with moderate to severe subarticular and foraminal stenosis on the left with left paracentral disc protrusion at L4-5 unchanged.  Only mild to moderate changes other levels including moderate central L3-4 stenosis.  Review of Systems 14 point system unchanged from 08/27/2019 office visit.   Objective: Vital Signs: Ht 5\' 2"  (1.575 m)   Wt 171 lb (77.6 kg)   BMI 31.28 kg/m   Physical Exam Constitutional:      Appearance: She is well-developed.  HENT:     Head: Normocephalic.     Right Ear: External ear normal.     Left Ear: External ear normal.  Eyes:     Pupils: Pupils are equal, round, and reactive to light.  Neck:     Thyroid: No thyromegaly.     Trachea: No tracheal deviation.  Cardiovascular:     Rate and Rhythm: Normal rate.  Pulmonary:     Effort: Pulmonary effort is normal.  Abdominal:     Palpations: Abdomen is soft.  Skin:    General: Skin is warm and dry.  Neurological:     Mental Status: She is alert and oriented to person,  place, and time.  Psychiatric:        Behavior: Behavior normal.     Ortho Exam scar over tibia from previous polio surgery right leg when she was a child.  Lumbar incisions well-healed no erythema.  Negative logroll the hips right and left.  Good ankle range of motion gastrocsoleus peroneals EHL anterior tib is strong.  Pain with straight leg raising 90 degrees on the left.  Specialty Comments:  No specialty comments available.  Imaging: No results found.   PMFS History: Patient Active Problem List   Diagnosis Date Noted  . Chronic sciatica of left side 04/01/2015  . HNP (herniated nucleus pulposus), lumbar 02/21/2014  . Synovial cyst of lumbar spine 02/21/2014  . Lumbar foraminal stenosis 02/21/2014   Past Medical History:  Diagnosis Date  . Anxiety   . Bronchitis due to tobacco use    quit smoking 3 months ago  . Chronic sciatica of left side  04/01/2015  . COPD (chronic obstructive pulmonary disease) ([facility])   . Depression   . GERD (gastroesophageal reflux disease)   . Hypertension   . Mitral valve prolapse   . Pneumonia    years ago  . 63    age 50 years old    Family History  Problem Relation Age of Onset  . Cancer Mother        thyroid  . Heart attack Father   . Cancer Sister        breast  . Kidney disease Sister        ESRD  . Irritable bowel syndrome Sister   . Cancer Sister        thyroid  . Colon cancer Neg Hx   . Esophageal cancer Neg Hx     Past Surgical History:  Procedure Laterality Date  . ABDOMINAL HYSTERECTOMY  1985  . BACK SURGERY    . COLONOSCOPY  2017  . LUMBAR LAMINECTOMY Left 02/21/2014   Procedure: Left L5-S1 Hemilaminectomy, Lateral Recess Decompression, Removal Synovial Cyst;  Surgeon: Marybelle Killings, MD;  Location: [facility];  Service: Orthopedics;  Laterality: Left;  Marland Kitchen MICROLARYNGOSCOPY N/A 10/11/2018   Procedure: MICROLARYNGOSCOPY with Biopsy;  Surgeon: Izora Gala, MD;  Location: [facility];  Service: ENT;  Laterality: N/A;  . polio Bilateral 1950's   surgery on feet and legs for polio  . POLIO SURGERY    . TONSILLECTOMY     Social History   Occupational History  . Occupation: Retired    Fish farm manager: OTHER  Tobacco Use  . Smoking status: Current Every Day Smoker    Packs/day: 1.50    Years: 54.00    Pack years: 81.00    Types: Cigarettes  . Smokeless tobacco: Never Used  . Tobacco comment: currently 2 packs per day  Substance and Sexual Activity  . Alcohol use: Yes    Alcohol/week: 0.0 standard drinks    Comment: occasional  . Drug use: No  . Sexual activity: Not on file

## 2019-09-25 ENCOUNTER — Telehealth: Payer: Self-pay

## 2019-09-25 DIAGNOSIS — Z87891 Personal history of nicotine dependence: Secondary | ICD-10-CM

## 2019-09-25 DIAGNOSIS — Z122 Encounter for screening for malignant neoplasm of respiratory organs: Secondary | ICD-10-CM

## 2019-09-25 NOTE — Telephone Encounter (Signed)
I contacted patient at [phone]  to schedule annual lung CT screening scan. Patient's last lung screening CT was 09/26/2018.  I left patient a message to call Glenna Fellows, lung navigator at [phone] to schedule.

## 2019-09-30 ENCOUNTER — Telehealth: Payer: Self-pay | Admitting: *Deleted

## 2019-09-30 NOTE — Telephone Encounter (Signed)
Pls advise. Thanks.  

## 2019-10-01 ENCOUNTER — Other Ambulatory Visit: Payer: Self-pay | Admitting: Orthopaedic Surgery

## 2019-10-01 ENCOUNTER — Telehealth: Payer: Self-pay | Admitting: Physical Medicine and Rehabilitation

## 2019-10-01 MED ORDER — HYDROCODONE-ACETAMINOPHEN 5-325 MG PO TABS: 1.0000 | ORAL_TABLET | Freq: Four times a day (QID) | ORAL | 0 refills | Status: DC | PRN

## 2019-10-01 NOTE — Telephone Encounter (Signed)
I sent in # 20 norco ucall,  thanks sent to [facility] her pharmacy thanks

## 2019-10-01 NOTE — Progress Notes (Signed)
norco 5/325 # 20 sent in PRN pain , pending ESI

## 2019-10-01 NOTE — Telephone Encounter (Signed)
Patient states that she received a prior auth letter from her insurance which states that she has to have her injection before June 4. She is scheduled for June 7.

## 2019-10-01 NOTE — Telephone Encounter (Signed)
This authorization will be valid for 30 days (10/01/2019 - 10/31/2019). No action is needed if the service occurs between these dates.  Called pt and advised.

## 2019-10-01 NOTE — Telephone Encounter (Signed)
IC s/w patient and advised  

## 2019-10-07 NOTE — Addendum Note (Signed)
Addended by: Jonne Ply on: 10/07/2019 10:23 AM   Modules accepted: Orders

## 2019-10-07 NOTE — Telephone Encounter (Signed)
Patient has been notified that annual lung cancer screening low dose CT scan is due currently or will be in near future. Confirmed that patient is within the age range of 55-77, and asymptomatic, (no signs or symptoms of lung cancer). Patient denies illness that would prevent curative treatment for lung cancer if found. Verified smoking history, (current, 83 pack year). The shared decision making visit was done 03/27/18. Patient is agreeable for CT scan being scheduled.

## 2019-10-14 NOTE — Telephone Encounter (Signed)
Patient informed/reminder of low dose lung cancer screening CT scan appointment on 10/17/19 @ 2:00

## 2019-10-17 ENCOUNTER — Ambulatory Visit: Admission: RE | Admit: 2019-10-17 | Payer: Medicare HMO | Source: Ambulatory Visit

## 2019-10-28 ENCOUNTER — Ambulatory Visit (INDEPENDENT_AMBULATORY_CARE_PROVIDER_SITE_OTHER): Payer: Medicare HMO | Admitting: Physical Medicine and Rehabilitation

## 2019-10-28 ENCOUNTER — Other Ambulatory Visit: Payer: Self-pay

## 2019-10-28 ENCOUNTER — Encounter: Payer: Self-pay | Admitting: Physical Medicine and Rehabilitation

## 2019-10-28 ENCOUNTER — Ambulatory Visit: Payer: Self-pay

## 2019-10-28 VITALS — BP 123/72 | HR 75

## 2019-10-28 DIAGNOSIS — M5416 Radiculopathy, lumbar region: Secondary | ICD-10-CM

## 2019-10-28 MED ORDER — METHYLPREDNISOLONE ACETATE 80 MG/ML IJ SUSP
40.0000 mg | Freq: Once | INTRAMUSCULAR | Status: AC
  Administered 2019-10-28: 40 mg

## 2019-10-28 NOTE — Progress Notes (Signed)
Pt states pain is in the left leg starting from the side of the hip. No groin pain. Pt states nothing makes pain better. Pt states sitting and walking increases pain.   Numeric Pain Rating Scale and Functional Assessment Average Pain (10)   In the last MONTH (on 0-10 scale) has pain interfered with the following?  1. General activity like being  able to carry out your everyday physical activities such as walking, climbing stairs, carrying groceries, or moving a chair?  Rating(10)   +Driver, -BT, -Dye Allergies.

## 2019-10-29 NOTE — Progress Notes (Signed)
[name] - 74 y.o. female MRN [id number]  Date of birth: 04-24-1946  Office Visit Note: Visit Date: 10/28/2019 PCP: Tally Joe, MD Referred by: Tally Joe, MD  Subjective: Chief Complaint  Patient presents with  . Lower Back - Pain   HPI: [name] is a 74 y.o. female who comes in today At the request of Dr. Annell Greening for diagnostic and therapeutic left L4 or L5 transforaminal epidural steroid injection.  Patient has had prior lumbar laminectomy decompression.  The last time I saw the patient was in 2018.  She does not really like getting injections and is very anxious today.  She is having clear L5 radicular symptoms on the left.  She reports this has been ongoing for 5 years.  Imaging reviewed today shows foraminal stenosis particular at L5 there is also a small paracentral disc protrusion at L4-5.  I did complete diagnostic L5 injection today.  Unfortunately she did get a lot of pain during the injectate but it was consistent with her symptoms.  ROS Otherwise per HPI.  Assessment & Plan: Visit Diagnoses:  1. Lumbar radiculopathy     Plan: No additional findings.   Meds & Orders:  Meds ordered this encounter  Medications  . methylPREDNISolone acetate (DEPO-MEDROL) injection 40 mg    Orders Placed This Encounter  Procedures  . XR C-ARM NO REPORT  . Epidural Steroid injection    Follow-up: Return for Annell Greening, MD.   Procedures: No procedures performed  Lumbosacral Transforaminal Epidural Steroid Injection - Sub-Pedicular Approach with Fluoroscopic Guidance  Patient: [name]      Date of Birth: Aug 29, 1945 MRN: [id number] PCP: Tally Joe, MD      Visit Date: 10/28/2019   Universal Protocol:    Date/Time: 10/28/2019  Consent Given By: the patient  Position: PRONE  Additional Comments: Vital signs were monitored before and after the procedure. Patient was prepped and draped in the usual sterile fashion. The correct patient, procedure, and  site was verified.   Injection Procedure Details:  Procedure Site One Meds Administered:  Meds ordered this encounter  Medications  . methylPREDNISolone acetate (DEPO-MEDROL) injection 40 mg    Laterality: Left  Location/Site:  L5-S1  Needle size: 22 G  Needle type: Spinal  Needle Placement: Transforaminal  Findings:    -Comments: Excellent flow of contrast along the nerve and into the epidural space.  Procedure Details: After squaring off the end-plates to get a true AP view, the C-arm was positioned so that an oblique view of the foramen as noted above was visualized. The target area is just inferior to the "nose of the scotty dog" or sub pedicular. The soft tissues overlying this structure were infiltrated with 2-3 ml. of 1% Lidocaine without Epinephrine.  The spinal needle was inserted toward the target using a "trajectory" view along the fluoroscope beam.  Under AP and lateral visualization, the needle was advanced so it did not puncture dura and was located close the 6 O'Clock position of the pedical in AP tracterory. Biplanar projections were used to confirm position. Aspiration was confirmed to be negative for CSF and/or blood. A 1-2 ml. volume of Isovue-250 was injected and flow of contrast was noted at each level. Radiographs were obtained for documentation purposes.   After attaining the desired flow of contrast documented above, a 0.5 to 1.0 ml test dose of 0.25% Marcaine was injected into each respective transforaminal space.  The patient was observed for 90 seconds post injection.  After no sensory deficits were reported, and normal lower extremity motor function was noted,   the above injectate was administered so that equal amounts of the injectate were placed at each foramen (level) into the transforaminal epidural space.   Additional Comments:  No complications occurred Dressing: 2 x 2 sterile gauze and Band-Aid    Post-procedure details: Patient was observed  during the procedure. Post-procedure instructions were reviewed.  Patient left the clinic in stable condition.      Clinical History: MRI LUMBAR SPINE WITHOUT CONTRAST  TECHNIQUE: Multiplanar, multisequence MR imaging of the lumbar spine was performed. No intravenous contrast was administered.  COMPARISON:  Lumbar MRI 05/08/2018  FINDINGS: Segmentation:  Normal  Alignment:  Mild retrolisthesis L1-2, L2-3, L3-4.  Vertebrae:  Normal bone marrow.  Negative for fracture or mass.  Conus medullaris and cauda equina: Conus extends to the L2 level. Conus and cauda equina appear normal.  Paraspinal and other soft tissues: Negative for paraspinous mass or adenopathy  Disc levels:  T11-12: Diffuse disc bulging with mild spinal stenosis  T12-L1: Negative  L1-2: Moderate disc degeneration with disc bulging and endplate spurring. Mild facet degeneration. Small left-sided disc protrusion with mild left subarticular stenosis. No interval change.  L2-3: Moderate to advanced disc degeneration with disc space narrowing, disc bulging and diffuse endplate spurring. Moderate facet hypertrophy. Mild spinal stenosis and mild subarticular stenosis bilaterally unchanged.  L3-4: Diffuse disc bulging and moderate facet hypertrophy. Moderate spinal stenosis and moderate subarticular stenosis bilaterally. No interval change.  L4-5: Disc degeneration with diffuse disc bulging. Small left paracentral disc protrusion is unchanged. Bilateral facet hypertrophy. Moderate subarticular stenosis bilaterally appears unchanged.  L5-S1: Left laminotomy is chronic and unchanged. Disc degeneration with diffuse endplate spurring. Moderate subarticular and foraminal stenosis on the right. Moderate to severe subarticular and foraminal stenosis on the left due to spurring is unchanged.  IMPRESSION: Advanced multilevel disc and facet degeneration throughout the lumbar spine causing spinal  and foraminal stenosis as described above.  Prior laminectomy left L5-S1. Moderate to severe subarticular and foraminal stenosis on the left at L5-S1 due to spurring is unchanged. Left paracentral disc protrusion L4-5 unchanged.  No significant change from the prior MRI 2019.   Electronically Signed   By: Marlan Palau M.D.   On: 09/18/2019 11:05   She reports that she has been smoking cigarettes. She has a 81.00 pack-year smoking history. She has never used smokeless tobacco. No results for input(s): HGBA1C, LABURIC in the last 8760 hours.  Objective:  VS:  HT:    WT:   BMI:     BP:123/72  HR:75bpm  TEMP: ( )  RESP:  Physical Exam Constitutional:      General: She is not in acute distress.    Appearance: Normal appearance. She is not ill-appearing.  HENT:     Head: Normocephalic and atraumatic.     Right Ear: External ear normal.     Left Ear: External ear normal.  Eyes:     Extraocular Movements: Extraocular movements intact.  Cardiovascular:     Rate and Rhythm: Normal rate.     Pulses: Normal pulses.  Musculoskeletal:     Right lower leg: No edema.     Left lower leg: No edema.     Comments: Patient has good distal strength with no pain over the greater trochanters.  No clonus or focal weakness.  Skin:    Findings: No erythema, lesion or rash.  Neurological:     General: No focal  deficit present.     Mental Status: She is alert and oriented to person, place, and time.     Sensory: No sensory deficit.     Motor: No weakness or abnormal muscle tone.     Coordination: Coordination normal.  Psychiatric:        Mood and Affect: Mood normal.        Behavior: Behavior normal.     Ortho Exam  Imaging: XR C-ARM NO REPORT  Result Date: 10/28/2019 Please see Notes tab for imaging impression.   Past Medical/Family/Surgical/Social History: Medications & Allergies reviewed per EMR, new medications updated. Patient Active Problem List   Diagnosis Date Noted    . Chronic sciatica of left side 04/01/2015  . HNP (herniated nucleus pulposus), lumbar 02/21/2014  . Synovial cyst of lumbar spine 02/21/2014  . Lumbar foraminal stenosis 02/21/2014   Past Medical History:  Diagnosis Date  . Anxiety   . Bronchitis due to tobacco use    quit smoking 3 months ago  . Chronic sciatica of left side 04/01/2015  . COPD (chronic obstructive pulmonary disease) ([facility])   . Depression   . GERD (gastroesophageal reflux disease)   . Hypertension   . Mitral valve prolapse   . Pneumonia    years ago  . 66    age 67 years old   Family History  Problem Relation Age of Onset  . Cancer Mother        thyroid  . Heart attack Father   . Cancer Sister        breast  . Kidney disease Sister        ESRD  . Irritable bowel syndrome Sister   . Cancer Sister        thyroid  . Colon cancer Neg Hx   . Esophageal cancer Neg Hx    Past Surgical History:  Procedure Laterality Date  . ABDOMINAL HYSTERECTOMY  1985  . BACK SURGERY    . COLONOSCOPY  2017  . LUMBAR LAMINECTOMY Left 02/21/2014   Procedure: Left L5-S1 Hemilaminectomy, Lateral Recess Decompression, Removal Synovial Cyst;  Surgeon: Marybelle Killings, MD;  Location: [facility];  Service: Orthopedics;  Laterality: Left;  Marland Kitchen MICROLARYNGOSCOPY N/A 10/11/2018   Procedure: MICROLARYNGOSCOPY with Biopsy;  Surgeon: Izora Gala, MD;  Location: [facility];  Service: ENT;  Laterality: N/A;  . polio Bilateral 1950's   surgery on feet and legs for polio  . POLIO SURGERY    . TONSILLECTOMY     Social History   Occupational History  . Occupation: Retired    Fish farm manager: OTHER  Tobacco Use  . Smoking status: Current Every Day Smoker    Packs/day: 1.50    Years: 54.00    Pack years: 81.00    Types: Cigarettes  . Smokeless tobacco: Never Used  . Tobacco comment: currently 2 packs per day  Substance and Sexual Activity  . Alcohol use: Yes    Alcohol/week: 0.0 standard drinks    Comment: occasional  . Drug use: No  . Sexual  activity: Not on file

## 2019-10-29 NOTE — Procedures (Signed)
Lumbosacral Transforaminal Epidural Steroid Injection - Sub-Pedicular Approach with Fluoroscopic Guidance  Patient: [name]      Date of Birth: 1945-09-03 MRN: [id number] PCP: Tally Joe, MD      Visit Date: 10/28/2019   Universal Protocol:    Date/Time: 10/28/2019  Consent Given By: the patient  Position: PRONE  Additional Comments: Vital signs were monitored before and after the procedure. Patient was prepped and draped in the usual sterile fashion. The correct patient, procedure, and site was verified.   Injection Procedure Details:  Procedure Site One Meds Administered:  Meds ordered this encounter  Medications  . methylPREDNISolone acetate (DEPO-MEDROL) injection 40 mg    Laterality: Left  Location/Site:  L5-S1  Needle size: 22 G  Needle type: Spinal  Needle Placement: Transforaminal  Findings:    -Comments: Excellent flow of contrast along the nerve and into the epidural space.  Procedure Details: After squaring off the end-plates to get a true AP view, the C-arm was positioned so that an oblique view of the foramen as noted above was visualized. The target area is just inferior to the "nose of the scotty dog" or sub pedicular. The soft tissues overlying this structure were infiltrated with 2-3 ml. of 1% Lidocaine without Epinephrine.  The spinal needle was inserted toward the target using a "trajectory" view along the fluoroscope beam.  Under AP and lateral visualization, the needle was advanced so it did not puncture dura and was located close the 6 O'Clock position of the pedical in AP tracterory. Biplanar projections were used to confirm position. Aspiration was confirmed to be negative for CSF and/or blood. A 1-2 ml. volume of Isovue-250 was injected and flow of contrast was noted at each level. Radiographs were obtained for documentation purposes.   After attaining the desired flow of contrast documented above, a 0.5 to 1.0 ml test dose of 0.25%  Marcaine was injected into each respective transforaminal space.  The patient was observed for 90 seconds post injection.  After no sensory deficits were reported, and normal lower extremity motor function was noted,   the above injectate was administered so that equal amounts of the injectate were placed at each foramen (level) into the transforaminal epidural space.   Additional Comments:  No complications occurred Dressing: 2 x 2 sterile gauze and Band-Aid    Post-procedure details: Patient was observed during the procedure. Post-procedure instructions were reviewed.  Patient left the clinic in stable condition.

## 2019-10-31 ENCOUNTER — Other Ambulatory Visit: Payer: Self-pay

## 2019-10-31 ENCOUNTER — Ambulatory Visit
Admission: RE | Admit: 2019-10-31 | Discharge: 2019-10-31 | Disposition: A | Discharge status: Home / Self Care | Payer: Medicare HMO | Source: Ambulatory Visit | Attending: Oncology | Admitting: Oncology

## 2019-10-31 DIAGNOSIS — Z122 Encounter for screening for malignant neoplasm of respiratory organs: Secondary | ICD-10-CM | POA: Insufficient documentation

## 2019-10-31 DIAGNOSIS — F17210 Nicotine dependence, cigarettes, uncomplicated: Secondary | ICD-10-CM | POA: Diagnosis not present

## 2019-10-31 DIAGNOSIS — Z87891 Personal history of nicotine dependence: Secondary | ICD-10-CM | POA: Diagnosis not present

## 2019-11-06 ENCOUNTER — Telehealth: Payer: Self-pay | Admitting: *Deleted

## 2019-11-06 NOTE — Telephone Encounter (Signed)
Left voicemail yesterday and today in attempt to notify of lung screening scan results.

## 2019-11-07 ENCOUNTER — Telehealth: Payer: Self-pay | Admitting: *Deleted

## 2019-11-07 NOTE — Telephone Encounter (Signed)
Notified patient of LDCT lung cancer screening program results with recommendation for 6 month follow up imaging. Also notified of incidental findings noted below and is encouraged to discuss further with PCP who will receive a copy of this note and/or the CT report. Patient verbalizes understanding.   IMPRESSION: 1. Lung-RADS 3, probably benign findings. Short-term follow-up in 6 months is recommended with repeat low-dose chest CT without contrast (please use the following order, "CT CHEST LCS NODULE FOLLOW-UP W/O CM"). 2. Diffuse bronchial wall thickening with emphysema, as above; imaging findings suggestive of underlying COPD. Emphysema (ICD10-J43.9).  Aortic Atherosclerosis (ICD10-I70.0) and Emphysema (ICD10-J43.9).

## 2019-11-26 ENCOUNTER — Ambulatory Visit: Payer: Medicare HMO | Admitting: Orthopaedic Surgery

## 2019-12-17 DIAGNOSIS — G4700 Insomnia, unspecified: Secondary | ICD-10-CM | POA: Diagnosis not present

## 2019-12-31 DIAGNOSIS — J449 Chronic obstructive pulmonary disease, unspecified: Secondary | ICD-10-CM | POA: Diagnosis not present

## 2019-12-31 DIAGNOSIS — G4700 Insomnia, unspecified: Secondary | ICD-10-CM | POA: Diagnosis not present

## 2019-12-31 DIAGNOSIS — Z8612 Personal history of poliomyelitis: Secondary | ICD-10-CM | POA: Diagnosis not present

## 2019-12-31 DIAGNOSIS — K219 Gastro-esophageal reflux disease without esophagitis: Secondary | ICD-10-CM | POA: Diagnosis not present

## 2019-12-31 DIAGNOSIS — N183 Chronic kidney disease, stage 3 unspecified: Secondary | ICD-10-CM | POA: Diagnosis not present

## 2019-12-31 DIAGNOSIS — I10 Essential (primary) hypertension: Secondary | ICD-10-CM | POA: Diagnosis not present

## 2019-12-31 DIAGNOSIS — R252 Cramp and spasm: Secondary | ICD-10-CM | POA: Diagnosis not present

## 2019-12-31 DIAGNOSIS — E7800 Pure hypercholesterolemia, unspecified: Secondary | ICD-10-CM | POA: Diagnosis not present

## 2019-12-31 DIAGNOSIS — F419 Anxiety disorder, unspecified: Secondary | ICD-10-CM | POA: Diagnosis not present

## 2020-01-09 ENCOUNTER — Telehealth: Payer: Self-pay

## 2020-01-09 NOTE — Telephone Encounter (Signed)
Patient called in wanting to speak with Dr Alvester Morin about an injection

## 2020-01-09 NOTE — Telephone Encounter (Signed)
Patient had left L5 TF on 10/28/19. Ok to repeat if helped, same problem/side, and no new injury?

## 2020-01-09 NOTE — Telephone Encounter (Signed)
If helped more than 40% then ok to repat, ? Valium, if not much help then Dr. Ophelia Charter F/up

## 2020-01-10 NOTE — Telephone Encounter (Signed)
Left message #1 to get more information from patient about her pain and request for an injection.

## 2020-01-13 ENCOUNTER — Telehealth: Payer: Self-pay | Admitting: Physical Medicine and Rehabilitation

## 2020-01-13 NOTE — Telephone Encounter (Signed)
Needs auth and scheduling for [id number]. Patient reports very good relief from last injection for over a month.

## 2020-01-13 NOTE — Telephone Encounter (Signed)
Patient called.   She is requesting an appointment for an injection   Call back: [phone]

## 2020-01-14 NOTE — Telephone Encounter (Signed)
Auth# [id number] has been approve, pt can be sch.  SVXB#[id number]

## 2020-01-15 NOTE — Telephone Encounter (Signed)
Left message #1

## 2020-01-17 NOTE — Telephone Encounter (Signed)
Tried to call patient again. Call cannot be completed at this time.

## 2020-01-20 ENCOUNTER — Telehealth: Payer: Self-pay | Admitting: Physical Medicine and Rehabilitation

## 2020-01-20 NOTE — Telephone Encounter (Signed)
See previous message

## 2020-01-20 NOTE — Telephone Encounter (Signed)
Patient called.   She was returning the call from our office to schedule with Dr.Newton

## 2020-01-20 NOTE — Telephone Encounter (Signed)
Patient requested afternoon appointment. Scheduled for 9/13 at 1430 with driver.

## 2020-02-03 ENCOUNTER — Other Ambulatory Visit: Payer: Self-pay

## 2020-02-03 ENCOUNTER — Encounter: Payer: Self-pay | Admitting: Physical Medicine and Rehabilitation

## 2020-02-03 ENCOUNTER — Ambulatory Visit: Payer: Self-pay

## 2020-02-03 ENCOUNTER — Ambulatory Visit (INDEPENDENT_AMBULATORY_CARE_PROVIDER_SITE_OTHER): Payer: Medicare HMO | Admitting: Physical Medicine and Rehabilitation

## 2020-02-03 VITALS — BP 131/73 | HR 86

## 2020-02-03 DIAGNOSIS — M5416 Radiculopathy, lumbar region: Secondary | ICD-10-CM

## 2020-02-03 DIAGNOSIS — M5116 Intervertebral disc disorders with radiculopathy, lumbar region: Secondary | ICD-10-CM

## 2020-02-03 DIAGNOSIS — M961 Postlaminectomy syndrome, not elsewhere classified: Secondary | ICD-10-CM

## 2020-02-03 MED ORDER — METHYLPREDNISOLONE ACETATE 80 MG/ML IJ SUSP
80.0000 mg | Freq: Once | INTRAMUSCULAR | Status: AC
  Administered 2020-02-03: 80 mg

## 2020-02-03 NOTE — Progress Notes (Signed)
Pt state lower back pain. Pt state walking and standing for a long time makes the pain worse. Pt state she just deal with the pain no relief.  Pt has hx of inj on 10/28/19   Numeric Pain Rating Scale and Functional Assessment Average Pain 7   In the last MONTH (on 0-10 scale) has pain interfered with the following?  1. General activity like being  able to carry out your everyday physical activities such as walking, climbing stairs, carrying groceries, or moving a chair?  Rating(3)   +Driver, -BT, -Dye Allergies.

## 2020-02-04 NOTE — Progress Notes (Signed)
[name] - 74 y.o. female MRN [id number]  Date of birth: December 29, 1945  Office Visit Note: Visit Date: 02/03/2020 PCP: Tally Joe, MD Referred by: Tally Joe, MD  Subjective: Chief Complaint  Patient presents with  . Lower Back - Pain   HPI: [name] is a 74 y.o. female who comes in today for planned repeat Left L5-S1 Lumbar epidural steroid injection with fluoroscopic guidance.  The patient has failed conservative care including home exercise, medications, time and activity modification.  This injection will be diagnostic and hopefully therapeutic.  Please see requesting physician notes for further details and justification. Patient received more than 50% pain relief from prior injection.   Referring: Dr. Annell Greening  Consider injection of the upper lumbar spine do to moderate stenosis.   ROS Otherwise per HPI.  Assessment & Plan: Visit Diagnoses:  1. Lumbar radiculopathy   2. Radiculopathy due to lumbar intervertebral disc disorder   3. Post laminectomy syndrome     Plan: No additional findings.   Meds & Orders:  Meds ordered this encounter  Medications  . methylPREDNISolone acetate (DEPO-MEDROL) injection 80 mg    Orders Placed This Encounter  Procedures  . XR C-ARM NO REPORT  . Epidural Steroid injection    Follow-up: Return for Annell Greening, MD as needed.   Procedures: No procedures performed  Lumbosacral Transforaminal Epidural Steroid Injection - Sub-Pedicular Approach with Fluoroscopic Guidance  Patient: [name]      Date of Birth: 1945/09/07 MRN: [id number] PCP: Tally Joe, MD      Visit Date: 02/03/2020   Universal Protocol:    Date/Time: 02/03/2020  Consent Given By: the patient  Position: PRONE  Additional Comments: Vital signs were monitored before and after the procedure. Patient was prepped and draped in the usual sterile fashion. The correct patient, procedure, and site was verified.   Injection Procedure Details:    Procedure Site One Meds Administered:  Meds ordered this encounter  Medications  . methylPREDNISolone acetate (DEPO-MEDROL) injection 80 mg    Laterality: Left  Location/Site:  L5-S1  Needle size: 22 G  Needle type: Spinal  Needle Placement: Transforaminal  Findings:    -Comments: Excellent flow of contrast along the nerve, nerve root and into the epidural space.  Procedure Details: After squaring off the end-plates to get a true AP view, the C-arm was positioned so that an oblique view of the foramen as noted above was visualized. The target area is just inferior to the "nose of the scotty dog" or sub pedicular. The soft tissues overlying this structure were infiltrated with 2-3 ml. of 1% Lidocaine without Epinephrine.  The spinal needle was inserted toward the target using a "trajectory" view along the fluoroscope beam.  Under AP and lateral visualization, the needle was advanced so it did not puncture dura and was located close the 6 O'Clock position of the pedical in AP tracterory. Biplanar projections were used to confirm position. Aspiration was confirmed to be negative for CSF and/or blood. A 1-2 ml. volume of Isovue-250 was injected and flow of contrast was noted at each level. Radiographs were obtained for documentation purposes.   After attaining the desired flow of contrast documented above, a 0.5 to 1.0 ml test dose of 0.25% Marcaine was injected into each respective transforaminal space.  The patient was observed for 90 seconds post injection.  After no sensory deficits were reported, and normal lower extremity motor function was noted,   the above injectate was administered  so that equal amounts of the injectate were placed at each foramen (level) into the transforaminal epidural space.   Additional Comments:  The patient tolerated the procedure well Dressing: 2 x 2 sterile gauze and Band-Aid    Post-procedure details: Patient was observed during the  procedure. Post-procedure instructions were reviewed.  Patient left the clinic in stable condition.      Clinical History: MRI LUMBAR SPINE WITHOUT CONTRAST  TECHNIQUE: Multiplanar, multisequence MR imaging of the lumbar spine was performed. No intravenous contrast was administered.  COMPARISON:  Lumbar MRI 05/08/2018  FINDINGS: Segmentation:  Normal  Alignment:  Mild retrolisthesis L1-2, L2-3, L3-4.  Vertebrae:  Normal bone marrow.  Negative for fracture or mass.  Conus medullaris and cauda equina: Conus extends to the L2 level. Conus and cauda equina appear normal.  Paraspinal and other soft tissues: Negative for paraspinous mass or adenopathy  Disc levels:  T11-12: Diffuse disc bulging with mild spinal stenosis  T12-L1: Negative  L1-2: Moderate disc degeneration with disc bulging and endplate spurring. Mild facet degeneration. Small left-sided disc protrusion with mild left subarticular stenosis. No interval change.  L2-3: Moderate to advanced disc degeneration with disc space narrowing, disc bulging and diffuse endplate spurring. Moderate facet hypertrophy. Mild spinal stenosis and mild subarticular stenosis bilaterally unchanged.  L3-4: Diffuse disc bulging and moderate facet hypertrophy. Moderate spinal stenosis and moderate subarticular stenosis bilaterally. No interval change.  L4-5: Disc degeneration with diffuse disc bulging. Small left paracentral disc protrusion is unchanged. Bilateral facet hypertrophy. Moderate subarticular stenosis bilaterally appears unchanged.  L5-S1: Left laminotomy is chronic and unchanged. Disc degeneration with diffuse endplate spurring. Moderate subarticular and foraminal stenosis on the right. Moderate to severe subarticular and foraminal stenosis on the left due to spurring is unchanged.  IMPRESSION: Advanced multilevel disc and facet degeneration throughout the lumbar spine causing spinal and  foraminal stenosis as described above.  Prior laminectomy left L5-S1. Moderate to severe subarticular and foraminal stenosis on the left at L5-S1 due to spurring is unchanged. Left paracentral disc protrusion L4-5 unchanged.  No significant change from the prior MRI 2019.   Electronically Signed   By: Marlan Palau M.D.   On: 09/18/2019 11:05   She reports that she has been smoking cigarettes. She has a 81.00 pack-year smoking history. She has never used smokeless tobacco. No results for input(s): HGBA1C, LABURIC in the last 8760 hours.  Objective:  VS:  HT:    WT:   BMI:     BP:131/73  HR:86bpm  TEMP: ( )  RESP:  Physical Exam Constitutional:      General: She is not in acute distress.    Appearance: Normal appearance. She is not ill-appearing.  HENT:     Head: Normocephalic and atraumatic.     Right Ear: External ear normal.     Left Ear: External ear normal.  Eyes:     Extraocular Movements: Extraocular movements intact.  Cardiovascular:     Rate and Rhythm: Normal rate.     Pulses: Normal pulses.  Musculoskeletal:     Right lower leg: No edema.     Left lower leg: No edema.     Comments: Patient has good distal strength with no pain over the greater trochanters.  No clonus or focal weakness.  Skin:    Findings: No erythema, lesion or rash.  Neurological:     General: No focal deficit present.     Mental Status: She is alert and oriented to person, place, and time.  Sensory: No sensory deficit.     Motor: No weakness or abnormal muscle tone.     Coordination: Coordination normal.  Psychiatric:        Mood and Affect: Mood normal.        Behavior: Behavior normal.     Ortho Exam  Imaging: XR C-ARM NO REPORT  Result Date: 02/03/2020 Please see Notes tab for imaging impression.   Past Medical/Family/Surgical/Social History: Medications & Allergies reviewed per EMR, new medications updated. Patient Active Problem List   Diagnosis Date Noted  .  Chronic sciatica of left side 04/01/2015  . HNP (herniated nucleus pulposus), lumbar 02/21/2014  . Synovial cyst of lumbar spine 02/21/2014  . Lumbar foraminal stenosis 02/21/2014   Past Medical History:  Diagnosis Date  . Anxiety   . Bronchitis due to tobacco use    quit smoking 3 months ago  . Chronic sciatica of left side 04/01/2015  . COPD (chronic obstructive pulmonary disease) (HCC)   . Depression   . GERD (gastroesophageal reflux disease)   . Hypertension   . Mitral valve prolapse   . Pneumonia    years ago  . Polio    age 79 years old   Family History  Problem Relation Age of Onset  . Cancer Mother        thyroid  . Heart attack Father   . Cancer Sister        breast  . Kidney disease Sister        ESRD  . Irritable bowel syndrome Sister   . Cancer Sister        thyroid  . Colon cancer Neg Hx   . Esophageal cancer Neg Hx    Past Surgical History:  Procedure Laterality Date  . ABDOMINAL HYSTERECTOMY  1985  . BACK SURGERY    . COLONOSCOPY  2017  . LUMBAR LAMINECTOMY Left 02/21/2014   Procedure: Left L5-S1 Hemilaminectomy, Lateral Recess Decompression, Removal Synovial Cyst;  Surgeon: Eldred Manges, MD;  Location: [facility] OR;  Service: Orthopedics;  Laterality: Left;  Marland Kitchen MICROLARYNGOSCOPY N/A 10/11/2018   Procedure: MICROLARYNGOSCOPY with Biopsy;  Surgeon: Serena Colonel, MD;  Location: [facility] OR;  Service: ENT;  Laterality: N/A;  . polio Bilateral 1950's   surgery on feet and legs for polio  . POLIO SURGERY    . TONSILLECTOMY     Social History   Occupational History  . Occupation: Retired    Associate Professor: [employer]  Tobacco Use  . Smoking status: Current Every Day Smoker    Packs/day: 1.50    Years: 54.00    Pack years: 81.00    Types: Cigarettes  . Smokeless tobacco: Never Used  . Tobacco comment: currently 2 packs per day  Vaping Use  . Vaping Use: Never used  Substance and Sexual Activity  . Alcohol use: Yes    Alcohol/week: 0.0 standard drinks    Comment:  occasional  . Drug use: No  . Sexual activity: Not on file

## 2020-02-04 NOTE — Procedures (Signed)
Lumbosacral Transforaminal Epidural Steroid Injection - Sub-Pedicular Approach with Fluoroscopic Guidance  Patient: [name]      Date of Birth: 05-03-1946 MRN: [id number] PCP: Tally Joe, MD      Visit Date: 02/03/2020   Universal Protocol:    Date/Time: 02/03/2020  Consent Given By: the patient  Position: PRONE  Additional Comments: Vital signs were monitored before and after the procedure. Patient was prepped and draped in the usual sterile fashion. The correct patient, procedure, and site was verified.   Injection Procedure Details:  Procedure Site One Meds Administered:  Meds ordered this encounter  Medications  . methylPREDNISolone acetate (DEPO-MEDROL) injection 80 mg    Laterality: Left  Location/Site:  L5-S1  Needle size: 22 G  Needle type: Spinal  Needle Placement: Transforaminal  Findings:    -Comments: Excellent flow of contrast along the nerve, nerve root and into the epidural space.  Procedure Details: After squaring off the end-plates to get a true AP view, the C-arm was positioned so that an oblique view of the foramen as noted above was visualized. The target area is just inferior to the "nose of the scotty dog" or sub pedicular. The soft tissues overlying this structure were infiltrated with 2-3 ml. of 1% Lidocaine without Epinephrine.  The spinal needle was inserted toward the target using a "trajectory" view along the fluoroscope beam.  Under AP and lateral visualization, the needle was advanced so it did not puncture dura and was located close the 6 O'Clock position of the pedical in AP tracterory. Biplanar projections were used to confirm position. Aspiration was confirmed to be negative for CSF and/or blood. A 1-2 ml. volume of Isovue-250 was injected and flow of contrast was noted at each level. Radiographs were obtained for documentation purposes.   After attaining the desired flow of contrast documented above, a 0.5 to 1.0 ml test dose  of 0.25% Marcaine was injected into each respective transforaminal space.  The patient was observed for 90 seconds post injection.  After no sensory deficits were reported, and normal lower extremity motor function was noted,   the above injectate was administered so that equal amounts of the injectate were placed at each foramen (level) into the transforaminal epidural space.   Additional Comments:  The patient tolerated the procedure well Dressing: 2 x 2 sterile gauze and Band-Aid    Post-procedure details: Patient was observed during the procedure. Post-procedure instructions were reviewed.  Patient left the clinic in stable condition.

## 2020-02-12 DIAGNOSIS — N183 Chronic kidney disease, stage 3 unspecified: Secondary | ICD-10-CM | POA: Diagnosis not present

## 2020-02-12 DIAGNOSIS — I10 Essential (primary) hypertension: Secondary | ICD-10-CM | POA: Diagnosis not present

## 2020-02-12 DIAGNOSIS — F324 Major depressive disorder, single episode, in partial remission: Secondary | ICD-10-CM | POA: Diagnosis not present

## 2020-02-12 DIAGNOSIS — J449 Chronic obstructive pulmonary disease, unspecified: Secondary | ICD-10-CM | POA: Diagnosis not present

## 2020-02-12 DIAGNOSIS — E7800 Pure hypercholesterolemia, unspecified: Secondary | ICD-10-CM | POA: Diagnosis not present

## 2020-02-12 DIAGNOSIS — G4700 Insomnia, unspecified: Secondary | ICD-10-CM | POA: Diagnosis not present

## 2020-02-12 DIAGNOSIS — K219 Gastro-esophageal reflux disease without esophagitis: Secondary | ICD-10-CM | POA: Diagnosis not present

## 2020-03-03 DIAGNOSIS — I10 Essential (primary) hypertension: Secondary | ICD-10-CM | POA: Diagnosis not present

## 2020-03-03 DIAGNOSIS — K219 Gastro-esophageal reflux disease without esophagitis: Secondary | ICD-10-CM | POA: Diagnosis not present

## 2020-03-03 DIAGNOSIS — E785 Hyperlipidemia, unspecified: Secondary | ICD-10-CM | POA: Insufficient documentation

## 2020-03-03 DIAGNOSIS — R918 Other nonspecific abnormal finding of lung field: Secondary | ICD-10-CM | POA: Diagnosis not present

## 2020-03-03 DIAGNOSIS — R252 Cramp and spasm: Secondary | ICD-10-CM | POA: Diagnosis not present

## 2020-03-10 DIAGNOSIS — R252 Cramp and spasm: Secondary | ICD-10-CM | POA: Diagnosis not present

## 2020-03-10 DIAGNOSIS — M5432 Sciatica, left side: Secondary | ICD-10-CM | POA: Diagnosis not present

## 2020-03-10 DIAGNOSIS — I10 Essential (primary) hypertension: Secondary | ICD-10-CM | POA: Diagnosis not present

## 2020-03-10 DIAGNOSIS — E785 Hyperlipidemia, unspecified: Secondary | ICD-10-CM | POA: Diagnosis not present

## 2020-03-10 DIAGNOSIS — K219 Gastro-esophageal reflux disease without esophagitis: Secondary | ICD-10-CM | POA: Diagnosis not present

## 2020-03-17 DIAGNOSIS — M5432 Sciatica, left side: Secondary | ICD-10-CM | POA: Diagnosis not present

## 2020-03-17 DIAGNOSIS — K219 Gastro-esophageal reflux disease without esophagitis: Secondary | ICD-10-CM | POA: Diagnosis not present

## 2020-03-17 DIAGNOSIS — I10 Essential (primary) hypertension: Secondary | ICD-10-CM | POA: Diagnosis not present

## 2020-03-17 DIAGNOSIS — R944 Abnormal results of kidney function studies: Secondary | ICD-10-CM | POA: Diagnosis not present

## 2020-03-17 DIAGNOSIS — E785 Hyperlipidemia, unspecified: Secondary | ICD-10-CM | POA: Diagnosis not present

## 2020-03-18 DIAGNOSIS — R809 Proteinuria, unspecified: Secondary | ICD-10-CM | POA: Insufficient documentation

## 2020-03-24 DIAGNOSIS — F5104 Psychophysiologic insomnia: Secondary | ICD-10-CM | POA: Diagnosis not present

## 2020-03-24 DIAGNOSIS — R809 Proteinuria, unspecified: Secondary | ICD-10-CM | POA: Diagnosis not present

## 2020-03-31 DIAGNOSIS — I10 Essential (primary) hypertension: Secondary | ICD-10-CM | POA: Diagnosis not present

## 2020-03-31 DIAGNOSIS — R809 Proteinuria, unspecified: Secondary | ICD-10-CM | POA: Diagnosis not present

## 2020-03-31 DIAGNOSIS — N182 Chronic kidney disease, stage 2 (mild): Secondary | ICD-10-CM | POA: Diagnosis not present

## 2020-03-31 DIAGNOSIS — F17210 Nicotine dependence, cigarettes, uncomplicated: Secondary | ICD-10-CM | POA: Diagnosis not present

## 2020-04-07 ENCOUNTER — Other Ambulatory Visit: Payer: Self-pay | Admitting: *Deleted

## 2020-04-07 DIAGNOSIS — R918 Other nonspecific abnormal finding of lung field: Secondary | ICD-10-CM

## 2020-04-07 DIAGNOSIS — Z87891 Personal history of nicotine dependence: Secondary | ICD-10-CM

## 2020-04-07 NOTE — Progress Notes (Signed)
Contacted and scheduled for LCS nodule follow up scan, patient is a current smoker, 84 pack year history.

## 2020-04-14 DIAGNOSIS — Z1231 Encounter for screening mammogram for malignant neoplasm of breast: Secondary | ICD-10-CM | POA: Diagnosis not present

## 2020-04-23 DIAGNOSIS — H524 Presbyopia: Secondary | ICD-10-CM | POA: Diagnosis not present

## 2020-04-23 DIAGNOSIS — H5212 Myopia, left eye: Secondary | ICD-10-CM | POA: Diagnosis not present

## 2020-04-23 DIAGNOSIS — Z961 Presence of intraocular lens: Secondary | ICD-10-CM | POA: Diagnosis not present

## 2020-04-23 DIAGNOSIS — H52203 Unspecified astigmatism, bilateral: Secondary | ICD-10-CM | POA: Diagnosis not present

## 2020-04-30 DIAGNOSIS — G4700 Insomnia, unspecified: Secondary | ICD-10-CM | POA: Diagnosis not present

## 2020-04-30 DIAGNOSIS — K219 Gastro-esophageal reflux disease without esophagitis: Secondary | ICD-10-CM | POA: Diagnosis not present

## 2020-04-30 DIAGNOSIS — N183 Chronic kidney disease, stage 3 unspecified: Secondary | ICD-10-CM | POA: Diagnosis not present

## 2020-04-30 DIAGNOSIS — F324 Major depressive disorder, single episode, in partial remission: Secondary | ICD-10-CM | POA: Diagnosis not present

## 2020-04-30 DIAGNOSIS — E7800 Pure hypercholesterolemia, unspecified: Secondary | ICD-10-CM | POA: Diagnosis not present

## 2020-04-30 DIAGNOSIS — J449 Chronic obstructive pulmonary disease, unspecified: Secondary | ICD-10-CM | POA: Diagnosis not present

## 2020-04-30 DIAGNOSIS — I10 Essential (primary) hypertension: Secondary | ICD-10-CM | POA: Diagnosis not present

## 2020-05-06 ENCOUNTER — Ambulatory Visit
Admission: RE | Admit: 2020-05-06 | Discharge: 2020-05-06 | Disposition: A | Discharge status: Home / Self Care | Payer: Medicare HMO | Source: Ambulatory Visit | Attending: Nurse Practitioner | Admitting: Nurse Practitioner

## 2020-05-06 ENCOUNTER — Other Ambulatory Visit: Payer: Self-pay

## 2020-05-06 DIAGNOSIS — J40 Bronchitis, not specified as acute or chronic: Secondary | ICD-10-CM | POA: Diagnosis not present

## 2020-05-06 DIAGNOSIS — R918 Other nonspecific abnormal finding of lung field: Secondary | ICD-10-CM | POA: Diagnosis not present

## 2020-05-06 DIAGNOSIS — J432 Centrilobular emphysema: Secondary | ICD-10-CM | POA: Diagnosis not present

## 2020-05-06 DIAGNOSIS — Z87891 Personal history of nicotine dependence: Secondary | ICD-10-CM | POA: Insufficient documentation

## 2020-05-06 DIAGNOSIS — I700 Atherosclerosis of aorta: Secondary | ICD-10-CM | POA: Diagnosis not present

## 2020-05-08 ENCOUNTER — Encounter: Payer: Self-pay | Admitting: *Deleted

## 2020-05-13 DIAGNOSIS — Z0100 Encounter for examination of eyes and vision without abnormal findings: Secondary | ICD-10-CM | POA: Diagnosis not present

## 2020-06-17 DIAGNOSIS — B91 Sequelae of poliomyelitis: Secondary | ICD-10-CM | POA: Insufficient documentation

## 2020-06-17 DIAGNOSIS — J449 Chronic obstructive pulmonary disease, unspecified: Secondary | ICD-10-CM | POA: Insufficient documentation

## 2020-06-17 DIAGNOSIS — K580 Irritable bowel syndrome with diarrhea: Secondary | ICD-10-CM | POA: Insufficient documentation

## 2020-06-18 DIAGNOSIS — F332 Major depressive disorder, recurrent severe without psychotic features: Secondary | ICD-10-CM | POA: Insufficient documentation

## 2020-06-19 DIAGNOSIS — R7303 Prediabetes: Secondary | ICD-10-CM | POA: Insufficient documentation

## 2020-06-19 DIAGNOSIS — N183 Chronic kidney disease, stage 3 unspecified: Secondary | ICD-10-CM | POA: Insufficient documentation

## 2020-06-26 DIAGNOSIS — R825 Elevated urine levels of drugs, medicaments and biological substances: Secondary | ICD-10-CM | POA: Insufficient documentation

## 2020-07-14 ENCOUNTER — Other Ambulatory Visit: Payer: Self-pay

## 2020-07-14 ENCOUNTER — Encounter: Payer: Self-pay | Admitting: Orthopaedic Surgery

## 2020-07-14 ENCOUNTER — Ambulatory Visit: Payer: Medicare HMO | Admitting: Orthopaedic Surgery

## 2020-07-14 VITALS — BP 136/80 | HR 71 | Ht 62.0 in | Wt 171.0 lb

## 2020-07-14 DIAGNOSIS — M5432 Sciatica, left side: Secondary | ICD-10-CM | POA: Diagnosis not present

## 2020-07-14 DIAGNOSIS — M48061 Spinal stenosis, lumbar region without neurogenic claudication: Secondary | ICD-10-CM

## 2020-07-14 MED ORDER — ACETAMINOPHEN-CODEINE #3 300-30 MG PO TABS
ORAL_TABLET | ORAL | 0 refills | Status: DC
Start: 2020-07-14 — End: 2022-06-28

## 2020-07-14 NOTE — Progress Notes (Signed)
Office Visit Note   Patient: [name]           Date of Birth: Aug 23, 1945           MRN: [id number] Visit Date: 07/14/2020              Requested by: Tally Joe, MD [phone] [place],  [place] [address] PCP: Tally Joe, MD   Assessment & Plan: Visit Diagnoses:  1. Lumbar foraminal stenosis   2. Chronic sciatica of left side     Plan: We will set up for single epidural injection on the left for the foraminal stenosis present L4-5 L5-S1.  She has not had an epidural in many years.  Single prescription Tylenol 3 20 tablets prescribed usually this lasted many months.  She can follow-up with me several weeks after the epidural.  Follow-Up Instructions: No follow-ups on file.   Orders:  No orders of the defined types were placed in this encounter.  No orders of the defined types were placed in this encounter.     Procedures: No procedures performed   Clinical Data: No additional findings.   Subjective: Chief Complaint  Patient presents with  . Left Leg - Pain  . Right Leg - Pain    HPI 75 year old female returns with ongoing problems with back pain and primarily left leg pain.  She is amatory with a cane.  Her pain has been persistent.  Previous microdiscectomy on the left at L5-S1.  She occasionally takes something for pain most recently Tylenol 3 usually 1 tablet possibly once a week.  She takes zolpidem at night.  Last prescription we gave her for pain was in Oct 10 2118 tablets of Norco 5/325.  Patient has no chills or fever.  No claudication symptoms.  Previous left hemilaminectomy L5-S1 with moderate subarticular stenosis on the left.  She also had left paracentral disc protrusion at L4-5.  Moderate subarticular stenosis on the left as well.  Patient is not interested in any further lumbar surgery.  Her weight has been stable.  She relates the pain has been chronic and is contributing to depression problems with constant pain.  Review of  Systems all other systems noncontributory to HPI.   Objective: Vital Signs: BP 136/80   Pulse 71   Ht 5\' 2"  (1.575 m)   Wt 171 lb (77.6 kg)   BMI 31.28 kg/m   Physical Exam Constitutional:      Appearance: She is well-developed.  HENT:     Head: Normocephalic.     Right Ear: External ear normal.     Left Ear: External ear normal.  Eyes:     Pupils: Pupils are equal, round, and reactive to light.  Neck:     Thyroid: No thyromegaly.     Trachea: No tracheal deviation.  Cardiovascular:     Rate and Rhythm: Normal rate.  Pulmonary:     Effort: Pulmonary effort is normal.  Abdominal:     Palpations: Abdomen is soft.  Skin:    General: Skin is warm and dry.  Neurological:     Mental Status: She is alert and oriented to person, place, and time.  Psychiatric:        Mood and Affect: Mood and affect normal.        Behavior: Behavior normal.     Ortho Exam negative logroll the hips.  Well-healed lumbar incision.  Ankle dorsiflexion plantarflexion toe flexion extension is strong.  Negative straight leg  raising 90 degrees.  Specialty Comments:  No specialty comments available.  Imaging:  Narrative & Impression  CLINICAL DATA:  Lumbar spinal stenosis. Low back pain. Prior surgery L5-S1.  EXAM: MRI LUMBAR SPINE WITHOUT CONTRAST  TECHNIQUE: Multiplanar, multisequence MR imaging of the lumbar spine was performed. No intravenous contrast was administered.  COMPARISON:  Lumbar MRI 05/08/2018  FINDINGS: Segmentation:  Normal  Alignment:  Mild retrolisthesis L1-2, L2-3, L3-4.  Vertebrae:  Normal bone marrow.  Negative for fracture or mass.  Conus medullaris and cauda equina: Conus extends to the L2 level. Conus and cauda equina appear normal.  Paraspinal and other soft tissues: Negative for paraspinous mass or adenopathy  Disc levels:  T11-12: Diffuse disc bulging with mild spinal stenosis  T12-L1: Negative  L1-2: Moderate disc degeneration with  disc bulging and endplate spurring. Mild facet degeneration. Small left-sided disc protrusion with mild left subarticular stenosis. No interval change.  L2-3: Moderate to advanced disc degeneration with disc space narrowing, disc bulging and diffuse endplate spurring. Moderate facet hypertrophy. Mild spinal stenosis and mild subarticular stenosis bilaterally unchanged.  L3-4: Diffuse disc bulging and moderate facet hypertrophy. Moderate spinal stenosis and moderate subarticular stenosis bilaterally. No interval change.  L4-5: Disc degeneration with diffuse disc bulging. Small left paracentral disc protrusion is unchanged. Bilateral facet hypertrophy. Moderate subarticular stenosis bilaterally appears unchanged.  L5-S1: Left laminotomy is chronic and unchanged. Disc degeneration with diffuse endplate spurring. Moderate subarticular and foraminal stenosis on the right. Moderate to severe subarticular and foraminal stenosis on the left due to spurring is unchanged.  IMPRESSION: Advanced multilevel disc and facet degeneration throughout the lumbar spine causing spinal and foraminal stenosis as described above.  Prior laminectomy left L5-S1. Moderate to severe subarticular and foraminal stenosis on the left at L5-S1 due to spurring is unchanged. Left paracentral disc protrusion L4-5 unchanged.  No significant change from the prior MRI 2019.   Electronically Signed   By: Marlan Palau M.D.   On: 09/18/2019 11:05       PMFS History: Patient Active Problem List   Diagnosis Date Noted  . Chronic sciatica of left side 04/01/2015  . HNP (herniated nucleus pulposus), lumbar 02/21/2014  . Synovial cyst of lumbar spine 02/21/2014  . Lumbar foraminal stenosis 02/21/2014   Past Medical History:  Diagnosis Date  . Anxiety   . Bronchitis due to tobacco use    quit smoking 3 months ago  . Chronic sciatica of left side 04/01/2015  . COPD (chronic obstructive pulmonary  disease) (HCC)   . Depression   . GERD (gastroesophageal reflux disease)   . Hypertension   . Mitral valve prolapse   . Pneumonia    years ago  . Polio    age 59 years old    Family History  Problem Relation Age of Onset  . Cancer Mother        thyroid  . Heart attack Father   . Cancer Sister        breast  . Kidney disease Sister        ESRD  . Irritable bowel syndrome Sister   . Cancer Sister        thyroid  . Colon cancer Neg Hx   . Esophageal cancer Neg Hx     Past Surgical History:  Procedure Laterality Date  . ABDOMINAL HYSTERECTOMY  1985  . BACK SURGERY    . COLONOSCOPY  2017  . LUMBAR LAMINECTOMY Left 02/21/2014   Procedure: Left L5-S1 Hemilaminectomy, Lateral Recess Decompression,  Removal Synovial Cyst;  Surgeon: Eldred Manges, MD;  Location: [facility] OR;  Service: Orthopedics;  Laterality: Left;  Marland Kitchen MICROLARYNGOSCOPY N/A 10/11/2018   Procedure: MICROLARYNGOSCOPY with Biopsy;  Surgeon: Serena Colonel, MD;  Location: [facility] OR;  Service: ENT;  Laterality: N/A;  . polio Bilateral 1950's   surgery on feet and legs for polio  . POLIO SURGERY    . TONSILLECTOMY     Social History   Occupational History  . Occupation: Retired    Associate Professor: [employer]  Tobacco Use  . Smoking status: Current Every Day Smoker    Packs/day: 1.50    Years: 54.00    Pack years: 81.00    Types: Cigarettes  . Smokeless tobacco: Never Used  . Tobacco comment: currently 2 packs per day  Vaping Use  . Vaping Use: Never used  Substance and Sexual Activity  . Alcohol use: Yes    Alcohol/week: 0.0 standard drinks    Comment: occasional  . Drug use: No  . Sexual activity: Not on file

## 2020-08-13 ENCOUNTER — Telehealth: Payer: Self-pay | Admitting: Physical Medicine and Rehabilitation

## 2020-08-13 NOTE — Telephone Encounter (Signed)
Patient called ask for a call back from Dr. [place] Blas assistance about pre authorization. Patient phone number is [phone].

## 2020-08-13 NOTE — Telephone Encounter (Signed)
Patient saw Dr. Ophelia Charter 07/14/20. Note from that visit mentions ESI, but no referral in chart. Please advise.

## 2020-08-14 NOTE — Telephone Encounter (Signed)
Left L5 tf esi in September 2021, ok to repeat

## 2020-08-14 NOTE — Telephone Encounter (Signed)
Needs auth and scheduling for repeat left L5 TF. Patient is aware that we will call her to schedule. Can be worked in as a 15 minute appointment.

## 2020-08-17 NOTE — Telephone Encounter (Signed)
Called patient to scheduled. No answer and voicemail not set up.

## 2020-08-17 NOTE — Telephone Encounter (Signed)
Pt was approve Authorization #[id number]

## 2020-08-19 NOTE — Telephone Encounter (Signed)
Scheduled for 4/11 at 1415 with driver.

## 2020-08-26 IMAGING — CT CT CHEST LCS NODULE FOLLOW-UP W/O CM
2 of 5 series · 15 of 40 positions shown, 18 images · non-contrast
Comparison: 03/27/2018

CLINICAL DATA: 72-year-old female with 81 pack-year history of
smoking. Lung cancer screening.

EXAM:
CT CHEST WITHOUT CONTRAST FOR LUNG CANCER SCREENING NODULE FOLLOW-UP
TECHNIQUE: Multidetector CT imaging of the chest was performed following the
standard protocol without IV contrast.

[Series 3: lung lcs f/u · axial · 0.63mm/px · z∈[-1164,-913]mm · 12 of 281 slices shown, 15 images]
[im 15/281  mediastinal]
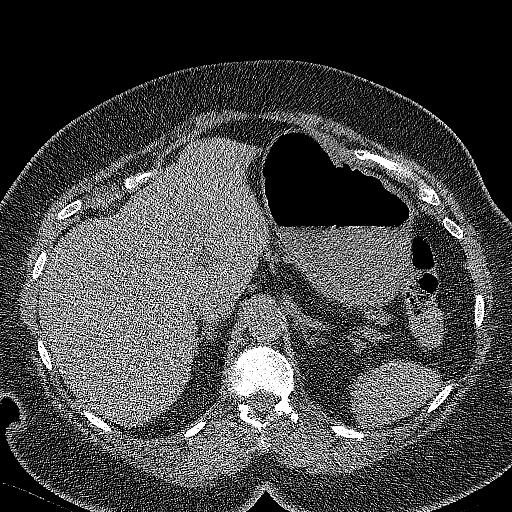
[im 15/281  lung]
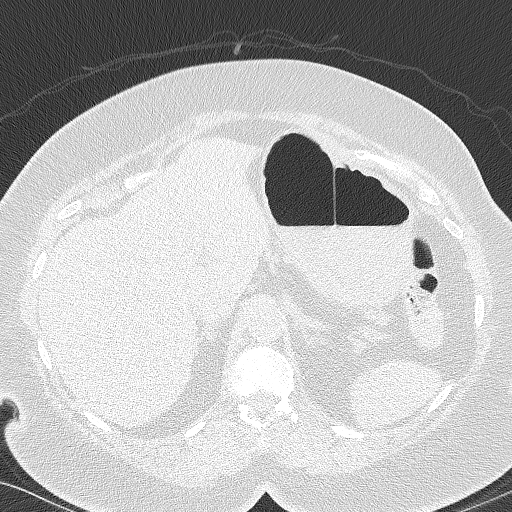
[im 45/281  lung]
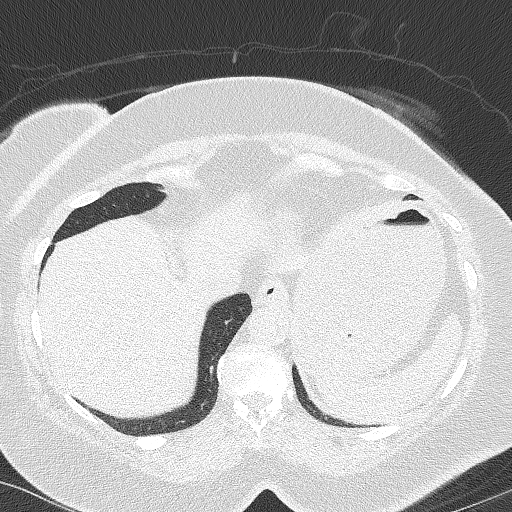
[im 59/281  lung]
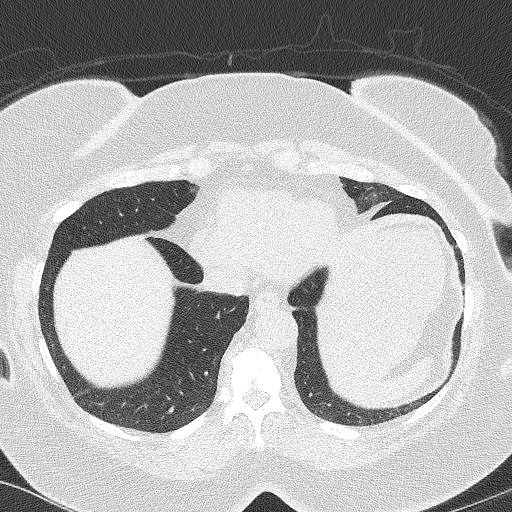
[im 89/281  lung]
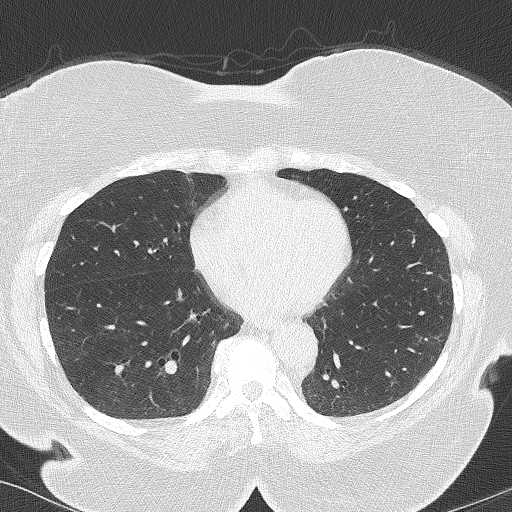
[im 104/281  mediastinal]
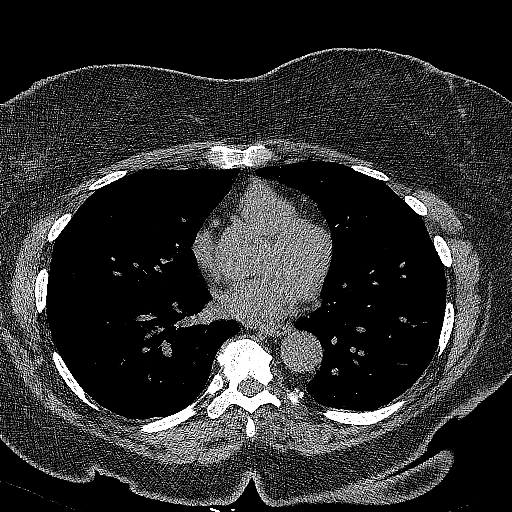
[im 104/281  lung]
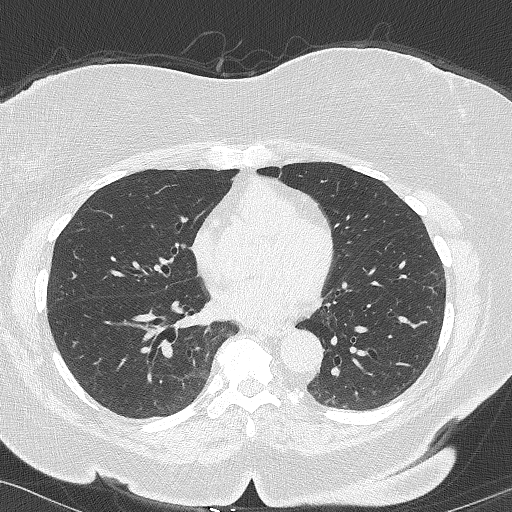
[im 133/281  lung]
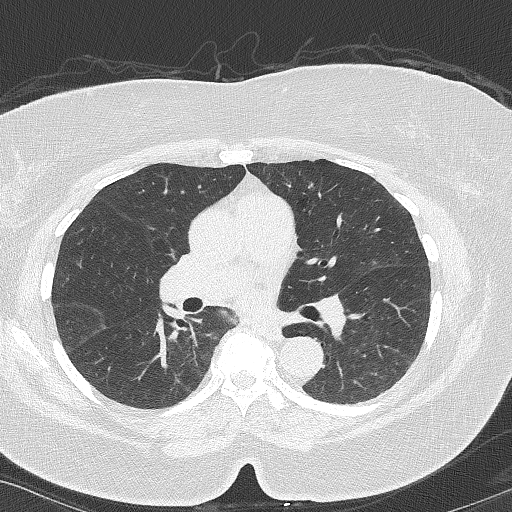
[im 148/281  lung]
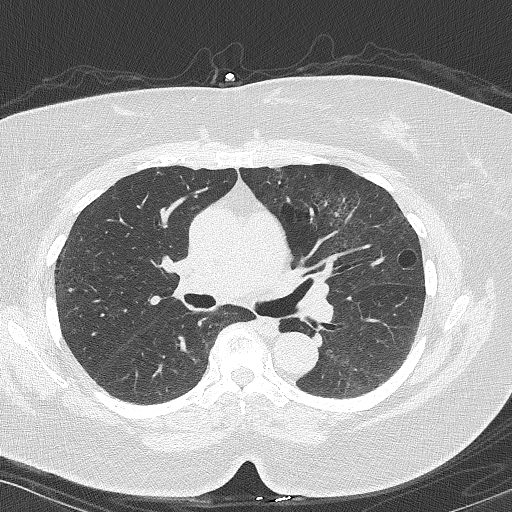
[im 177/281  lung]
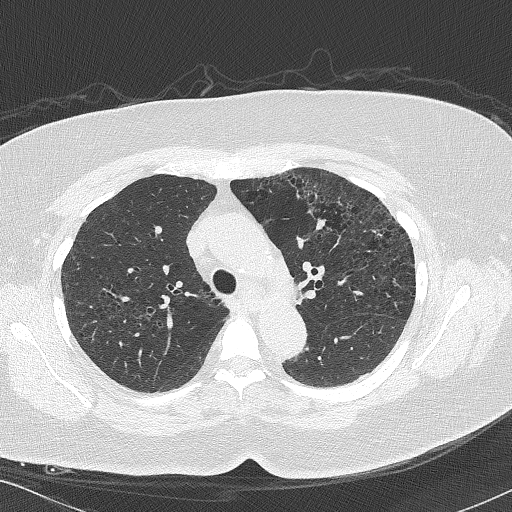
[im 192/281  mediastinal]
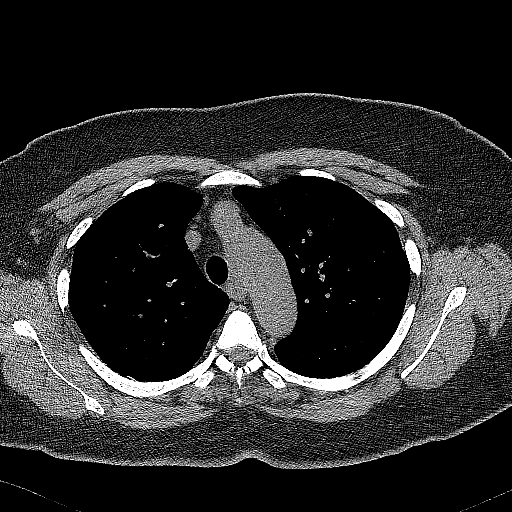
[im 192/281  lung]
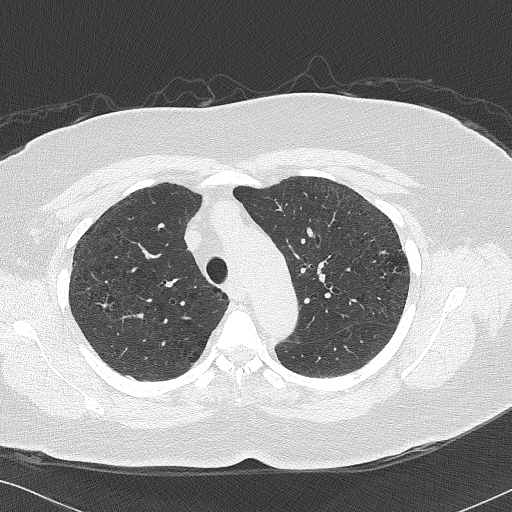
[im 222/281  lung]
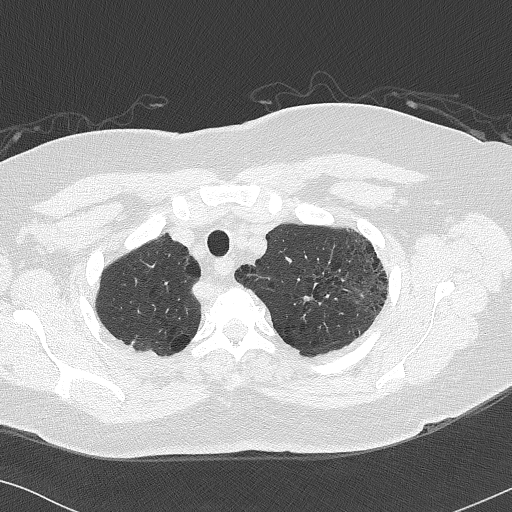
[im 236/281  lung]
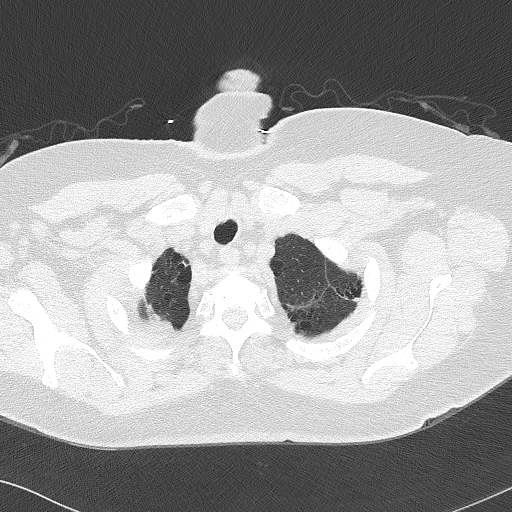
[im 266/281  lung]
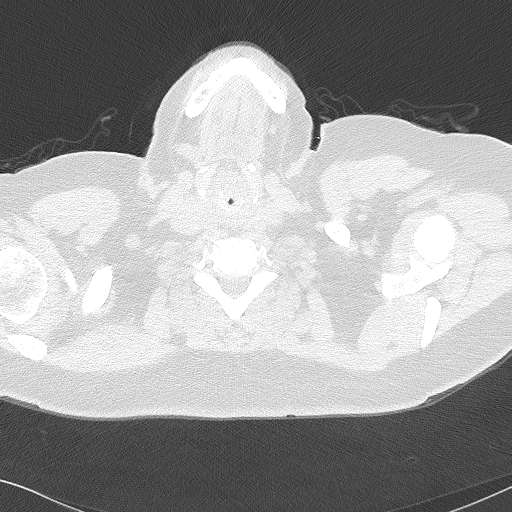

[Series 4: lcs f/u · coronal · 0.55mm/px · 3 of 318 slices shown]
[im 64/318  lung]
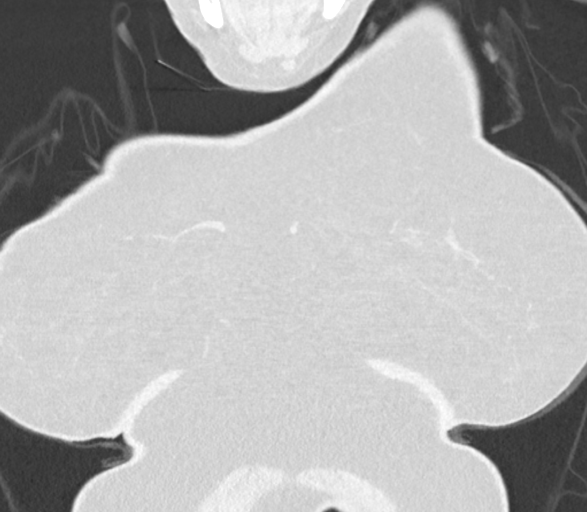
[im 127/318  lung]
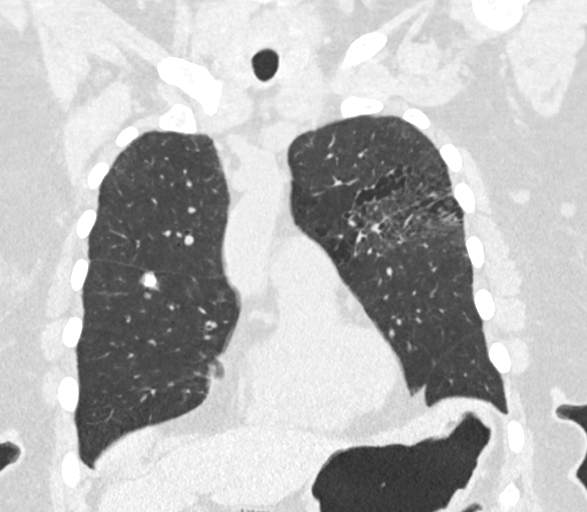
[im 191/318  lung]
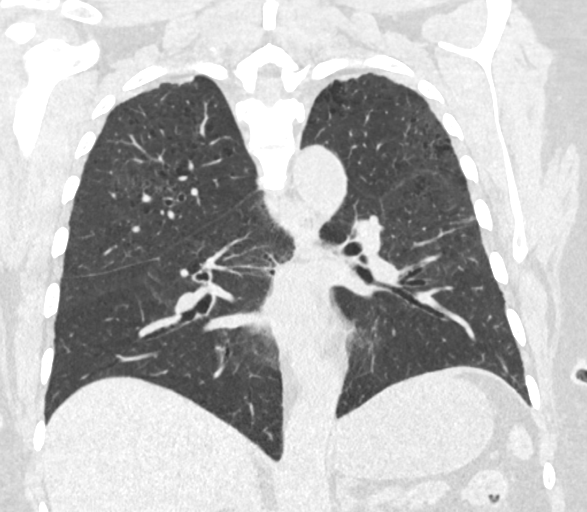

[15 of 40 positions shown; findings below may reference images not displayed]

FINDINGS: Cardiovascular: The heart size is normal. No substantial pericardial
effusion. Coronary artery calcification is evident. Aberrant origin
right subclavian artery noted. Atherosclerotic calcification is
noted in the wall of the thoracic aorta.

Mediastinum/Nodes: No mediastinal lymphadenopathy. No evidence for
gross hilar lymphadenopathy although assessment is limited by the
lack of intravenous contrast on today's study. The esophagus has
normal imaging features. There is no axillary lymphadenopathy.

Lungs/Pleura: The central tracheobronchial airways are patent.
Centrilobular and paraseptal emphysema evident. Scattered areas of
architectural distortion and parenchymal scarring again noted.
Scattered bilateral pulmonary nodules identified previously are
stable in the interval. No new suspicious nodule or mass. No pleural
effusion.

Upper Abdomen: Exophytic low-density lesion upper pole left kidney
incompletely visualized today but appears similar to prior and
remains compatible with a cyst.

Musculoskeletal: No worrisome lytic or sclerotic osseous
abnormality.
IMPRESSION: 1. Lung-RADS 2, benign appearance or behavior. Continue annual
screening with low-dose chest CT without contrast in 12 months.
2.  Emphysema. (IURJS-HBD.J)
3.  Aortic Atherosclerois (IURJS-170.0)

## 2020-08-31 ENCOUNTER — Ambulatory Visit: Payer: Self-pay

## 2020-08-31 ENCOUNTER — Ambulatory Visit: Payer: Medicare HMO | Admitting: Physical Medicine and Rehabilitation

## 2020-08-31 ENCOUNTER — Encounter: Payer: Self-pay | Admitting: Physical Medicine and Rehabilitation

## 2020-08-31 VITALS — BP 113/76 | HR 96

## 2020-08-31 DIAGNOSIS — M5416 Radiculopathy, lumbar region: Secondary | ICD-10-CM

## 2020-08-31 MED ORDER — METHYLPREDNISOLONE ACETATE 80 MG/ML IJ SUSP
80.0000 mg | Freq: Once | INTRAMUSCULAR | Status: AC
  Administered 2020-08-31: 80 mg

## 2020-08-31 NOTE — Progress Notes (Signed)
[name] - 75 y.o. female MRN [id number]  Date of birth: 02-Aug-1945  Office Visit Note: Visit Date: 08/31/2020 PCP: Tally Joe, MD Referred by: Tally Joe, MD  Subjective: Chief Complaint  Patient presents with  . Lower Back - Pain  . Left Knee - Pain   HPI:  [name] is a 75 y.o. female who comes in today at the request of Dr. Annell Greening for planned Left L5-S1 Lumbar epidural steroid injection with fluoroscopic guidance.  The patient has failed conservative care including home exercise, medications, time and activity modification.  This injection will be diagnostic and hopefully therapeutic.  Please see requesting physician notes for further details and justification. MRI reviewed with images and spine model.  MRI reviewed in the note below.   Consider Spinal cord stimulator trial.   ROS Otherwise per HPI.  Assessment & Plan: Visit Diagnoses:    ICD-10-CM   1. Lumbar radiculopathy  M54.16 XR C-ARM NO REPORT    Epidural Steroid injection    methylPREDNISolone acetate (DEPO-MEDROL) injection 80 mg    Plan: No additional findings.   Meds & Orders:  Meds ordered this encounter  Medications  . methylPREDNISolone acetate (DEPO-MEDROL) injection 80 mg    Orders Placed This Encounter  Procedures  . XR C-ARM NO REPORT  . Epidural Steroid injection    Follow-up: Return in about 4 weeks (around 09/28/2020) for Annell Greening, MD.   Procedures: No procedures performed  Lumbosacral Transforaminal Epidural Steroid Injection - Sub-Pedicular Approach with Fluoroscopic Guidance  Patient: [name]      Date of Birth: [birth year] MRN: [id number] PCP: Tally Joe, MD      Visit Date: 08/31/2020   Universal Protocol:    Date/Time: 08/31/2020  Consent Given By: the patient  Position: PRONE  Additional Comments: Vital signs were monitored before and after the procedure. Patient was prepped and draped in the usual sterile fashion. The correct patient,  procedure, and site was verified.   Injection Procedure Details:   Procedure diagnoses: Lumbar radiculopathy [M54.16]    Meds Administered:  Meds ordered this encounter  Medications  . methylPREDNISolone acetate (DEPO-MEDROL) injection 80 mg    Laterality: Left  Location/Site:  L5-S1  Needle:5.0 in., 22 ga.  Short bevel or Quincke spinal needle  Needle Placement: Transforaminal  Findings:    -Comments: Excellent flow of contrast along the nerve, nerve root and into the epidural space.  Procedure Details: After squaring off the end-plates to get a true AP view, the C-arm was positioned so that an oblique view of the foramen as noted above was visualized. The target area is just inferior to the "nose of the scotty dog" or sub pedicular. The soft tissues overlying this structure were infiltrated with 2-3 ml. of 1% Lidocaine without Epinephrine.  The spinal needle was inserted toward the target using a "trajectory" view along the fluoroscope beam.  Under AP and lateral visualization, the needle was advanced so it did not puncture dura and was located close the 6 O'Clock position of the pedical in AP tracterory. Biplanar projections were used to confirm position. Aspiration was confirmed to be negative for CSF and/or blood. A 1-2 ml. volume of Isovue-250 was injected and flow of contrast was noted at each level. Radiographs were obtained for documentation purposes.   After attaining the desired flow of contrast documented above, a 0.5 to 1.0 ml test dose of 0.25% Marcaine was injected into each respective transforaminal space.  The patient was observed  for 90 seconds post injection.  After no sensory deficits were reported, and normal lower extremity motor function was noted,   the above injectate was administered so that equal amounts of the injectate were placed at each foramen (level) into the transforaminal epidural space.   Additional Comments:  The patient tolerated the procedure  well Dressing: 2 x 2 sterile gauze and Band-Aid    Post-procedure details: Patient was observed during the procedure. Post-procedure instructions were reviewed.  Patient left the clinic in stable condition.      Clinical History: MRI LUMBAR SPINE WITHOUT CONTRAST  TECHNIQUE: Multiplanar, multisequence MR imaging of the lumbar spine was performed. No intravenous contrast was administered.  COMPARISON:  Lumbar MRI 05/08/2018  FINDINGS: Segmentation:  Normal  Alignment:  Mild retrolisthesis L1-2, L2-3, L3-4.  Vertebrae:  Normal bone marrow.  Negative for fracture or mass.  Conus medullaris and cauda equina: Conus extends to the L2 level. Conus and cauda equina appear normal.  Paraspinal and other soft tissues: Negative for paraspinous mass or adenopathy  Disc levels:  T11-12: Diffuse disc bulging with mild spinal stenosis  T12-L1: Negative  L1-2: Moderate disc degeneration with disc bulging and endplate spurring. Mild facet degeneration. Small left-sided disc protrusion with mild left subarticular stenosis. No interval change.  L2-3: Moderate to advanced disc degeneration with disc space narrowing, disc bulging and diffuse endplate spurring. Moderate facet hypertrophy. Mild spinal stenosis and mild subarticular stenosis bilaterally unchanged.  L3-4: Diffuse disc bulging and moderate facet hypertrophy. Moderate spinal stenosis and moderate subarticular stenosis bilaterally. No interval change.  L4-5: Disc degeneration with diffuse disc bulging. Small left paracentral disc protrusion is unchanged. Bilateral facet hypertrophy. Moderate subarticular stenosis bilaterally appears unchanged.  L5-S1: Left laminotomy is chronic and unchanged. Disc degeneration with diffuse endplate spurring. Moderate subarticular and foraminal stenosis on the right. Moderate to severe subarticular and foraminal stenosis on the left due to spurring is  unchanged.  IMPRESSION: Advanced multilevel disc and facet degeneration throughout the lumbar spine causing spinal and foraminal stenosis as described above.  Prior laminectomy left L5-S1. Moderate to severe subarticular and foraminal stenosis on the left at L5-S1 due to spurring is unchanged. Left paracentral disc protrusion L4-5 unchanged.  No significant change from the prior MRI 2019.   Electronically Signed   By: Marlan Palau M.D.   On: 09/18/2019 11:05     Objective:  VS:  HT:    WT:   BMI:     BP:113/76  HR:96bpm  TEMP: ( )  RESP:  Physical Exam Vitals and nursing note reviewed.  Constitutional:      General: She is not in acute distress.    Appearance: Normal appearance. She is obese. She is not ill-appearing.  HENT:     Head: Normocephalic and atraumatic.     Right Ear: External ear normal.     Left Ear: External ear normal.  Eyes:     Extraocular Movements: Extraocular movements intact.  Cardiovascular:     Rate and Rhythm: Normal rate.     Pulses: Normal pulses.  Pulmonary:     Effort: Pulmonary effort is normal. No respiratory distress.  Abdominal:     General: There is no distension.     Palpations: Abdomen is soft.  Musculoskeletal:        General: Tenderness present.     Cervical back: Neck supple.     Right lower leg: No edema.     Left lower leg: No edema.     Comments:  Patient has good distal strength with no pain over the greater trochanters.  No clonus or focal weakness.  Skin:    Findings: No erythema, lesion or rash.  Neurological:     General: No focal deficit present.     Mental Status: She is alert and oriented to person, place, and time.     Sensory: No sensory deficit.     Motor: No weakness or abnormal muscle tone.     Coordination: Coordination normal.     Gait: Gait normal.  Psychiatric:        Mood and Affect: Mood normal.        Behavior: Behavior normal.      Imaging: No results found.

## 2020-08-31 NOTE — Procedures (Signed)
Lumbosacral Transforaminal Epidural Steroid Injection - Sub-Pedicular Approach with Fluoroscopic Guidance  Patient: [name]      Date of Birth: 12/31/1945 MRN: [id number] PCP: Tally Joe, MD      Visit Date: 08/31/2020   Universal Protocol:    Date/Time: 08/31/2020  Consent Given By: the patient  Position: PRONE  Additional Comments: Vital signs were monitored before and after the procedure. Patient was prepped and draped in the usual sterile fashion. The correct patient, procedure, and site was verified.   Injection Procedure Details:   Procedure diagnoses: Lumbar radiculopathy [M54.16]    Meds Administered:  Meds ordered this encounter  Medications  . methylPREDNISolone acetate (DEPO-MEDROL) injection 80 mg    Laterality: Left  Location/Site:  L5-S1  Needle:5.0 in., 22 ga.  Short bevel or Quincke spinal needle  Needle Placement: Transforaminal  Findings:    -Comments: Excellent flow of contrast along the nerve, nerve root and into the epidural space.  Procedure Details: After squaring off the end-plates to get a true AP view, the C-arm was positioned so that an oblique view of the foramen as noted above was visualized. The target area is just inferior to the "nose of the scotty dog" or sub pedicular. The soft tissues overlying this structure were infiltrated with 2-3 ml. of 1% Lidocaine without Epinephrine.  The spinal needle was inserted toward the target using a "trajectory" view along the fluoroscope beam.  Under AP and lateral visualization, the needle was advanced so it did not puncture dura and was located close the 6 O'Clock position of the pedical in AP tracterory. Biplanar projections were used to confirm position. Aspiration was confirmed to be negative for CSF and/or blood. A 1-2 ml. volume of Isovue-250 was injected and flow of contrast was noted at each level. Radiographs were obtained for documentation purposes.   After attaining the desired  flow of contrast documented above, a 0.5 to 1.0 ml test dose of 0.25% Marcaine was injected into each respective transforaminal space.  The patient was observed for 90 seconds post injection.  After no sensory deficits were reported, and normal lower extremity motor function was noted,   the above injectate was administered so that equal amounts of the injectate were placed at each foramen (level) into the transforaminal epidural space.   Additional Comments:  The patient tolerated the procedure well Dressing: 2 x 2 sterile gauze and Band-Aid    Post-procedure details: Patient was observed during the procedure. Post-procedure instructions were reviewed.  Patient left the clinic in stable condition.

## 2020-08-31 NOTE — Progress Notes (Signed)
Pt state lower back pain the travels down left leg to her knee. Pt state walking, standing and sitting the makes the pain worse. Pt state she take pain meds to help ease her pain. Pt has hx of inj on 02/03/20 pt state it helped for a few months with 80% relief.  Numeric Pain Rating Scale and Functional Assessment Average Pain 10   In the last MONTH (on 0-10 scale) has pain interfered with the following?  1. General activity like being  able to carry out your everyday physical activities such as walking, climbing stairs, carrying groceries, or moving a chair?  Rating(10)   +Driver, -BT, -Dye Allergies.

## 2020-08-31 NOTE — Patient Instructions (Signed)

## 2020-09-29 ENCOUNTER — Ambulatory Visit: Payer: Medicare HMO | Admitting: Orthopaedic Surgery

## 2020-09-29 ENCOUNTER — Encounter: Payer: Self-pay | Admitting: Orthopaedic Surgery

## 2020-09-29 VITALS — BP 124/80 | HR 83 | Ht 62.0 in | Wt 169.2 lb

## 2020-09-29 DIAGNOSIS — M48061 Spinal stenosis, lumbar region without neurogenic claudication: Secondary | ICD-10-CM | POA: Diagnosis not present

## 2020-09-29 DIAGNOSIS — M7138 Other bursal cyst, other site: Secondary | ICD-10-CM

## 2020-09-29 NOTE — Progress Notes (Signed)
Office Visit Note   Patient: [name]           Date of Birth: 1945-07-24           MRN: [id number] Visit Date: 09/29/2020              Requested by: Tally Joe, MD [phone] [address] [place],  [place] [address] PCP: Tally Joe, MD   Assessment & Plan: Visit Diagnoses:  1. Synovial cyst of lumbar spine   2. Lumbar foraminal stenosis     Plan: Lumbar spondylosis now 7 years post microdiscectomy and removal of synovial cyst.  This was that L5-S1 on the left.  She has gotten some good relief with the epidural.  She is happy with the results of the injection and will return if she has ongoing problems.  Follow-Up Instructions: Return if symptoms worsen or fail to improve.   Orders:  No orders of the defined types were placed in this encounter.  No orders of the defined types were placed in this encounter.     Procedures: No procedures performed   Clinical Data: No additional findings.   Subjective: Chief Complaint  Patient presents with  . Lower Back - Pain, Follow-up    HPI 75 year old female returns post microdiscectomy left L5-S1 and recent epidural for ongoing problems with pain.  No claudication symptoms.  She states epidurals given her probably 70% pain relief.  Patient also had some disc protrusion at the L4-5 level.  Lumbar surgery with removal of synovial cyst left L5-S1 02/21/2014.  Review of Systems all other systems noncontributory to HPI.   Objective: Vital Signs: BP 124/80   Pulse 83   Ht 5\' 2"  (1.575 m)   Wt 169 lb 3.2 oz (76.7 kg)   BMI 30.95 kg/m   Physical Exam Constitutional:      Appearance: She is well-developed.  HENT:     Head: Normocephalic.     Right Ear: External ear normal.     Left Ear: External ear normal.  Eyes:     Pupils: Pupils are equal, round, and reactive to light.  Neck:     Thyroid: No thyromegaly.     Trachea: No tracheal deviation.  Cardiovascular:     Rate and Rhythm: Normal rate.  Pulmonary:      Effort: Pulmonary effort is normal.  Abdominal:     Palpations: Abdomen is soft.  Skin:    General: Skin is warm and dry.  Neurological:     Mental Status: She is alert and oriented to person, place, and time.  Psychiatric:        Behavior: Behavior normal.     Ortho Exam patient is ambulatory and can walk without cane.  Negative logroll of the hips.  Lumbar incisions well-healed.  Ankle dorsiflexion plantarflexion is strong.  Negative straight leg raising 90 degrees no lower extremity atrophy.  Specialty Comments:  No specialty comments available.  Imaging: No results found.   PMFS History: Patient Active Problem List   Diagnosis Date Noted  . Chronic sciatica of left side 04/01/2015  . HNP (herniated nucleus pulposus), lumbar 02/21/2014  . Synovial cyst of lumbar spine 02/21/2014  . Lumbar foraminal stenosis 02/21/2014   Past Medical History:  Diagnosis Date  . Anxiety   . Bronchitis due to tobacco use    quit smoking 3 months ago  . Chronic sciatica of left side 04/01/2015  . COPD (chronic obstructive pulmonary disease) (HCC)   . Depression   .  GERD (gastroesophageal reflux disease)   . Hypertension   . Mitral valve prolapse   . Pneumonia    years ago  . Polio    age 49 years old    Family History  Problem Relation Age of Onset  . Cancer Mother        thyroid  . Heart attack Father   . Cancer Sister        breast  . Kidney disease Sister        ESRD  . Irritable bowel syndrome Sister   . Cancer Sister        thyroid  . Colon cancer Neg Hx   . Esophageal cancer Neg Hx     Past Surgical History:  Procedure Laterality Date  . ABDOMINAL HYSTERECTOMY  1985  . BACK SURGERY    . COLONOSCOPY  2017  . LUMBAR LAMINECTOMY Left 02/21/2014   Procedure: Left L5-S1 Hemilaminectomy, Lateral Recess Decompression, Removal Synovial Cyst;  Surgeon: Eldred Manges, MD;  Location: [facility] OR;  Service: Orthopedics;  Laterality: Left;  Marland Kitchen MICROLARYNGOSCOPY N/A 10/11/2018    Procedure: MICROLARYNGOSCOPY with Biopsy;  Surgeon: Serena Colonel, MD;  Location: [facility];  Service: ENT;  Laterality: N/A;  . polio Bilateral 1950's   surgery on feet and legs for polio  . POLIO SURGERY    . TONSILLECTOMY     Social History   Occupational History  . Occupation: Retired    Associate Professor: [employer]  Tobacco Use  . Smoking status: Current Every Day Smoker    Packs/day: 1.50    Years: 54.00    Pack years: 81.00    Types: Cigarettes  . Smokeless tobacco: Never Used  . Tobacco comment: currently 2 packs per day  Vaping Use  . Vaping Use: Never used  Substance and Sexual Activity  . Alcohol use: Yes    Alcohol/week: 0.0 standard drinks    Comment: occasional  . Drug use: No  . Sexual activity: Not on file

## 2021-02-22 ENCOUNTER — Other Ambulatory Visit: Payer: Self-pay | Admitting: *Deleted

## 2021-02-22 DIAGNOSIS — Z87891 Personal history of nicotine dependence: Secondary | ICD-10-CM

## 2021-02-22 DIAGNOSIS — F17210 Nicotine dependence, cigarettes, uncomplicated: Secondary | ICD-10-CM

## 2021-03-03 ENCOUNTER — Telehealth: Payer: Self-pay | Admitting: Orthopaedic Surgery

## 2021-03-03 NOTE — Telephone Encounter (Signed)
Handicap parking application signed by Dr. Ophelia Charter. IC patient, she'd like it mailed to her. I placed in the mail to her.

## 2021-04-14 ENCOUNTER — Ambulatory Visit: Payer: Medicare HMO | Admitting: Podiatry

## 2021-04-14 ENCOUNTER — Other Ambulatory Visit: Payer: Self-pay

## 2021-04-14 DIAGNOSIS — B351 Tinea unguium: Secondary | ICD-10-CM | POA: Diagnosis not present

## 2021-04-14 DIAGNOSIS — M79675 Pain in left toe(s): Secondary | ICD-10-CM

## 2021-04-14 NOTE — Progress Notes (Signed)
   SUBJECTIVE Patient presents to office today complaining of elongated, thickened nails that cause pain while ambulating in shoes to her left foot.  Patient is unable to trim their own nails.  Patient has a history of polio when she was 75 years old right lower extremity.  She says that her right foot is very hypersensitive and she would prefer not to have it evaluated or touched today.  Patient is here for further evaluation and treatment.  Past Medical History:  Diagnosis Date   Anxiety    Bronchitis due to tobacco use    quit smoking 3 months ago   Chronic sciatica of left side 04/01/2015   COPD (chronic obstructive pulmonary disease) (HCC)    Depression    GERD (gastroesophageal reflux disease)    Hypertension    Mitral valve prolapse    Pneumonia    years ago   Polio    age 75 years old    OBJECTIVE General Patient is awake, alert, and oriented x 3 and in no acute distress. Derm Skin is dry and supple bilateral. Negative open lesions or macerations. Remaining integument unremarkable. Nails are tender, long, thickened and dystrophic with subungual debris, consistent with onychomycosis, 1-5 left.  No signs of infection noted. Vasc  DP and PT pedal pulses palpable bilaterally. Temperature gradient within normal limits.  Neuro Epicritic and protective threshold sensation grossly intact bilaterally.  Musculoskeletal Exam No symptomatic pedal deformities noted bilateral.  Right foot is smaller than the left foot secondary to the history of polio.  Loss of muscle strength right foot.  Patient has a surgical history of tendon transfer and multiple surgeries to the right foot and leg  ASSESSMENT 1.  Pain due to onychomycosis of toenails both  PLAN OF CARE 1. Patient evaluated today.  2. Instructed to maintain good pedal hygiene and foot care.  3. Mechanical debridement of nails 1-5 left performed using a nail nipper. Filed with dremel without incident.  4. Return to clinic in 3 mos.     Felecia Shelling, DPM [facility]  Dr. Felecia Shelling, DPM    [address] [place], [place] [address]                Office [phone]  Fax [phone]

## 2021-04-19 ENCOUNTER — Ambulatory Visit: Payer: Medicare HMO | Admitting: Podiatry

## 2021-05-07 ENCOUNTER — Other Ambulatory Visit: Payer: Self-pay

## 2021-05-07 ENCOUNTER — Ambulatory Visit
Admission: RE | Admit: 2021-05-07 | Discharge: 2021-05-07 | Disposition: A | Discharge status: Home / Self Care | Payer: Medicare HMO | Source: Ambulatory Visit | Attending: Acute Care | Admitting: Acute Care

## 2021-05-07 DIAGNOSIS — F17210 Nicotine dependence, cigarettes, uncomplicated: Secondary | ICD-10-CM

## 2021-05-07 DIAGNOSIS — Z87891 Personal history of nicotine dependence: Secondary | ICD-10-CM | POA: Insufficient documentation

## 2021-05-12 ENCOUNTER — Other Ambulatory Visit: Payer: Self-pay | Admitting: Acute Care

## 2021-05-12 DIAGNOSIS — Z87891 Personal history of nicotine dependence: Secondary | ICD-10-CM

## 2021-05-12 DIAGNOSIS — F17210 Nicotine dependence, cigarettes, uncomplicated: Secondary | ICD-10-CM

## 2021-07-26 DIAGNOSIS — M79602 Pain in left arm: Secondary | ICD-10-CM | POA: Insufficient documentation

## 2021-10-09 ENCOUNTER — Encounter (HOSPITAL_COMMUNITY): Payer: Self-pay | Admitting: Emergency Medicine

## 2021-10-09 ENCOUNTER — Other Ambulatory Visit: Payer: Self-pay

## 2021-10-09 ENCOUNTER — Emergency Department (HOSPITAL_COMMUNITY): Payer: Medicare HMO

## 2021-10-09 ENCOUNTER — Emergency Department (HOSPITAL_COMMUNITY)
Admission: EM | Admit: 2021-10-09 | Discharge: 2021-10-09 | Disposition: A | Discharge status: Home / Self Care | Payer: Medicare HMO | Attending: Emergency Medicine | Admitting: Emergency Medicine

## 2021-10-09 DIAGNOSIS — R2689 Other abnormalities of gait and mobility: Secondary | ICD-10-CM | POA: Diagnosis not present

## 2021-10-09 DIAGNOSIS — M542 Cervicalgia: Secondary | ICD-10-CM | POA: Insufficient documentation

## 2021-10-09 DIAGNOSIS — Z7982 Long term (current) use of aspirin: Secondary | ICD-10-CM | POA: Insufficient documentation

## 2021-10-09 DIAGNOSIS — J449 Chronic obstructive pulmonary disease, unspecified: Secondary | ICD-10-CM | POA: Diagnosis not present

## 2021-10-09 DIAGNOSIS — R531 Weakness: Secondary | ICD-10-CM | POA: Insufficient documentation

## 2021-10-09 DIAGNOSIS — Z79899 Other long term (current) drug therapy: Secondary | ICD-10-CM | POA: Insufficient documentation

## 2021-10-09 DIAGNOSIS — Z8612 Personal history of poliomyelitis: Secondary | ICD-10-CM

## 2021-10-09 DIAGNOSIS — I10 Essential (primary) hypertension: Secondary | ICD-10-CM | POA: Insufficient documentation

## 2021-10-09 DIAGNOSIS — R519 Headache, unspecified: Secondary | ICD-10-CM | POA: Insufficient documentation

## 2021-10-09 DIAGNOSIS — E876 Hypokalemia: Secondary | ICD-10-CM

## 2021-10-09 LAB — URINALYSIS, ROUTINE W REFLEX MICROSCOPIC
Bilirubin Urine: NEGATIVE
Glucose, UA: NEGATIVE mg/dL
Hgb urine dipstick: NEGATIVE
Ketones, ur: NEGATIVE mg/dL
Nitrite: NEGATIVE
Protein, ur: NEGATIVE mg/dL
Specific Gravity, Urine: 1.012 (ref 1.005–1.030)
pH: 5 (ref 5.0–8.0)

## 2021-10-09 LAB — BASIC METABOLIC PANEL
Anion gap: 7 (ref 5–15)
BUN: 15 mg/dL (ref 8–23)
CO2: 28 mmol/L (ref 22–32)
Calcium: 10.5 mg/dL — ABNORMAL HIGH (ref 8.9–10.3)
Chloride: 106 mmol/L (ref 98–111)
Creatinine, Ser: 1.08 mg/dL — ABNORMAL HIGH (ref 0.44–1.00)
GFR, Estimated: 53 mL/min — ABNORMAL LOW (ref >60)
Glucose, Bld: 136 mg/dL — ABNORMAL HIGH (ref 70–99)
Potassium: 3.2 mmol/L — ABNORMAL LOW (ref 3.5–5.1)
Sodium: 141 mmol/L (ref 135–145)

## 2021-10-09 LAB — HEPATIC FUNCTION PANEL
ALT: 15 U/L (ref 0–44)
AST: 20 U/L (ref 15–41)
Albumin: 3.5 g/dL (ref 3.5–5.0)
Alkaline Phosphatase: 91 U/L (ref 38–126)
Bilirubin, Direct: 0.1 mg/dL (ref 0.0–0.2)
Indirect Bilirubin: 0.5 mg/dL (ref 0.3–0.9)
Total Bilirubin: 0.6 mg/dL (ref 0.3–1.2)
Total Protein: 6.7 g/dL (ref 6.5–8.1)

## 2021-10-09 LAB — CBC
HCT: 40.1 % (ref 36.0–46.0)
Hemoglobin: 12.9 g/dL (ref 12.0–15.0)
MCH: 28.9 pg (ref 26.0–34.0)
MCHC: 32.2 g/dL (ref 30.0–36.0)
MCV: 89.9 fL (ref 80.0–100.0)
Platelets: 296 10*3/uL (ref 150–400)
RBC: 4.46 MIL/uL (ref 3.87–5.11)
RDW: 15.8 % — ABNORMAL HIGH (ref 11.5–15.5)
WBC: 6.2 10*3/uL (ref 4.0–10.5)
nRBC: 0 % (ref 0.0–0.2)

## 2021-10-09 MED ORDER — LORAZEPAM 2 MG/ML IJ SOLN: 0.5000 mg | INTRAMUSCULAR | Status: DC | PRN

## 2021-10-09 MED ORDER — LORAZEPAM 1 MG PO TABS
1.0000 mg | ORAL_TABLET | Freq: Once | ORAL | Status: AC
  Administered 2021-10-09: 1 mg via ORAL
  Filled 2021-10-09: qty 1

## 2021-10-09 MED ORDER — POTASSIUM CHLORIDE CRYS ER 20 MEQ PO TBCR
40.0000 meq | EXTENDED_RELEASE_TABLET | Freq: Once | ORAL | Status: AC
  Administered 2021-10-09: 40 meq via ORAL
  Filled 2021-10-09: qty 2

## 2021-10-09 MED ORDER — SODIUM CHLORIDE 0.9% FLUSH: 3.0000 mL | Freq: Once | INTRAVENOUS | Status: DC

## 2021-10-09 NOTE — ED Provider Notes (Signed)
[facility] Provider Note   CSN: [id number] Arrival date & time: 10/09/21  1121     History  Chief Complaint  Patient presents with   Dizziness   Headache    [name] is a 76 y.o. female with a pertinent history of polio (residual weakness to right lower extremity), hypertension, mitral valve prolapse, COPD, chronic sciatica of left side, GERD, anxiety, depression.  Presents to the emergency department the chief complaint of headaches and gait instability.  Patient reports that she has been dealing with headaches intermittently over the last 2 months.  Patient states that headache started at the beginning of March.  Towards the end of April headaches became a every day occurrence.  Patient states that she has sudden onset of headache that last for anywhere from 5 to 30 minutes.  Pain is located to the frontal temporal aspect of her head as well as the right parietal aspect of her head.  These headaches come on randomly without aggravation.  States that yesterday her headache had a gradual onset and pain was progressively worse over time.  Headache was present throughout the entire day.  Patient states that yesterday she also noticed "wobbling when walking."  Patient states that she usually uses a cane for long distance ambulation however yesterday had to use her cane to ambulate around her house which is odd for her.  Patient reports waking this morning with continued gait instability.    Patient does endorse that she does feel dizzy and lightheaded when her headaches are present.  Patient states that she does notice some pain to the right side of her neck intermittently over the last 2 months.  Patient does not associate pain with her headache onset.  Patient states that she does notice that she has poor posture and is often very tense to her right shoulder and trapezius muscles.  Patient denies any illicit drug use or alcohol use.  Does endorse  cigarette use.  No recent falls or traumatic injuries.  Patient denies any weakness other than baseline weakness right foot, numbness, chest pain, palpitations, shortness of breath, syncope, visual service, neck pain, back pain, abdominal pain, nausea, vomiting, diarrhea, dysuria, hematuria, urinary urgency, vaginal pain, vaginal bleeding, vaginal discharge.   Dizziness Associated symptoms: headaches and weakness   Associated symptoms: no chest pain, no nausea, no palpitations, no shortness of breath and no vomiting   Headache Associated symptoms: dizziness and weakness   Associated symptoms: no abdominal pain, no back pain, no fever, no nausea, no neck pain, no numbness, no seizures and no vomiting       Home Medications Prior to Admission medications   Medication Sig Start Date End Date Taking? Authorizing Provider  acetaminophen-codeine (TYLENOL #3) 300-30 MG tablet Take one at night for severe pain, do not take with sleep medication 07/14/20   Marybelle Killings, MD  aspirin 325 MG tablet Take 325 mg by mouth daily.    [provider]  atorvastatin (LIPITOR) 40 MG tablet Take 40 mg by mouth daily. 04/08/16   [provider]  bisacodyl (DULCOLAX) 10 MG suppository Place 1 suppository (10 mg total) rectally as needed for moderate constipation. 10/13/18   Maudie Flakes, MD  bisacodyl (DULCOLAX) 5 MG EC tablet Take 5 mg by mouth daily as needed for mild constipation or moderate constipation.    [provider]  docusate sodium (COLACE) 100 MG capsule Take 100 mg by mouth daily as needed for mild  constipation.    [provider]  DULoxetine (CYMBALTA) 60 MG capsule Take 60 mg by mouth daily. 12/25/13   [provider]  Homeopathic Products (LEG CRAMP RELIEF) SUBL Place 1 tablet under the tongue daily as needed (leg cramps). Hyland Brand    [provider]  hydrochlorothiazide (HYDRODIURIL) 25 MG tablet Take 25 mg by mouth daily.  09/26/17    [provider]  HYDROcodone-acetaminophen (NORCO/VICODIN) 5-325 MG tablet Take 1 tablet by mouth every 6 (six) hours as needed for moderate pain. 10/01/19   Marybelle Killings, MD  omeprazole (PRILOSEC) 20 MG capsule Take 20 mg by mouth daily.    [provider]  pregabalin (LYRICA) 50 MG capsule Take one at night for one week then one twice daily 09/20/19   Marybelle Killings, MD  zolpidem (AMBIEN) 5 MG tablet Take 5 mg by mouth at bedtime as needed for sleep.    [provider]      Allergies    Rosuvastatin, Sulfa antibiotics, and Wellbutrin [bupropion]    Review of Systems   Review of Systems  Constitutional:  Negative for chills and fever.  Eyes:  Negative for visual disturbance.  Respiratory:  Negative for shortness of breath.   Cardiovascular:  Negative for chest pain and palpitations.  Gastrointestinal:  Negative for abdominal pain, nausea and vomiting.  Genitourinary:  Negative for difficulty urinating, dysuria, frequency, hematuria, urgency, vaginal bleeding, vaginal discharge and vaginal pain.  Musculoskeletal:  Negative for back pain and neck pain.  Skin:  Negative for color change and rash.  Neurological:  Positive for dizziness, weakness, light-headedness and headaches. Negative for tremors, seizures, syncope, facial asymmetry, speech difficulty and numbness.  Psychiatric/Behavioral:  Negative for confusion.    Physical Exam Updated Vital Signs BP 123/65 (BP Location: Left Arm)   Pulse 79   Temp 97.6 F (36.4 C) (Oral)   Resp 16   SpO2 98%  Physical Exam Vitals and nursing note reviewed.  Constitutional:      General: She is not in acute distress.    Appearance: She is not ill-appearing, toxic-appearing or diaphoretic.  HENT:     Head: Normocephalic.  Eyes:     General:        Right eye: No discharge.        Left eye: No discharge.     Extraocular Movements: Extraocular movements intact.     Right eye: Nystagmus present.     Left eye:  Nystagmus present.     Conjunctiva/sclera: Conjunctivae normal.     Pupils: Pupils are equal, round, and reactive to light.     Comments: Horizontal nystagmus to bilateral eyes  Cardiovascular:     Rate and Rhythm: Normal rate.     Pulses:          Radial pulses are 2+ on the right side and 2+ on the left side.  Pulmonary:     Effort: Pulmonary effort is normal. No tachypnea, bradypnea or respiratory distress.     Breath sounds: Normal breath sounds. No stridor.  Abdominal:     General: There is no distension. There are no signs of injury.     Palpations: Abdomen is soft. There is no mass or pulsatile mass.     Tenderness: There is no abdominal tenderness. There is no guarding or rebound.     Hernia: There is no hernia in the umbilical area or ventral area.  Musculoskeletal:     Cervical back: Normal range of motion and  neck supple. No rigidity.  Skin:    General: Skin is warm and dry.  Neurological:     General: No focal deficit present.     Mental Status: She is alert and oriented to person, place, and time.     GCS: GCS eye subscore is 4. GCS verbal subscore is 5. GCS motor subscore is 6.     Cranial Nerves: No cranial nerve deficit or facial asymmetry.     Sensory: Sensation is intact.     Motor: No weakness, tremor, seizure activity or pronator drift.     Coordination: Finger-Nose-Finger Test normal.     Comments: CN II-XII intact; performed in supine position, +5 strength to bilateral upper extremities, +5 strength to dorsiflexion and plantarflexion of left leg, decreased strength to plantarflexion and dorsiflexion of right leg.  Patient able to lift both legs against gravity and hold each there without difficulty   Psychiatric:        Behavior: Behavior is cooperative.    ED Results / Procedures / Treatments   Labs (all labs ordered are listed, but only abnormal results are displayed) Labs Reviewed  BASIC METABOLIC PANEL - Abnormal; Notable for the following components:       Result Value   Potassium 3.2 (*)    Glucose, Bld 136 (*)    Creatinine, Ser 1.08 (*)    Calcium 10.5 (*)    GFR, Estimated 53 (*)    All other components within normal limits  CBC - Abnormal; Notable for the following components:   RDW 15.8 (*)    All other components within normal limits  URINALYSIS, ROUTINE W REFLEX MICROSCOPIC - Abnormal; Notable for the following components:   APPearance HAZY (*)    Leukocytes,Ua LARGE (*)    Bacteria, UA RARE (*)    All other components within normal limits  HEPATIC FUNCTION PANEL    EKG EKG Interpretation  Date/Time:  Saturday Oct 09 2021 11:30:46 EDT Ventricular Rate:  74 PR Interval:  136 QRS Duration: 76 QT Interval:  376 QTC Calculation: 417 R Axis:   -15 Text Interpretation: Normal sinus rhythm Low voltage QRS Cannot rule out Anterior infarct , age undetermined Abnormal ECG When compared with ECG of 09-Oct-2018 13:42, PREVIOUS ECG IS PRESENT Confirmed by Dene Gentry [phone]) on 10/09/2021 11:49:48 AM  Radiology No results found.  Procedures Procedures    Medications Ordered in ED Medications  sodium chloride flush (NS) 0.9 % injection 3 mL (has no administration in time range)    ED Course/ Medical Decision Making/ A&P Clinical Course as of 10/09/21 1638  Sat Oct 09, 2021  1614 FU on MRI, if neg dc home with Neuro FU [BH]    Clinical Course User Index [BH] Henderly, Britni A, PA-C                           Medical Decision Making Amount and/or Complexity of Data Reviewed Labs: ordered. Radiology: ordered.  Risk Prescription drug management.   Alert 76 year old female in no acute distress, nontoxic-appearing.  Presents to the ED with a chief complaint of headache and gait instability.  Information obtained from patient.  Past medical records were reviewed including previous provider notes, labs, and imaging.  Per chart review patient had CT head without contrast on 5/18 which showed no acute intracranial  abnormality.  This was ordered by her PCP after she saw him in April for reports of headache.  With new reports  of gait instability will obtain MRI to evaluate for acute CVA.  Will check basic labs and urinalysis to look for other possible cause of patient's headache.  I personally viewed interpret patient's EKG.  Tracing shows sinus rhythm.  I personally viewed and interpreted patient's lab results.  Pertinent findings include: -Creatinine 1.08, appears baseline for patient -Potassium 3.2; will replete with oral potassium.  MRI imaging is pending at this time.  If unremarkable imaging will have patient follow-up with neurology in the outpatient setting.  Patient care transferred to PA Henderly at the end of my shift. Patient presentation, ED course, and plan of care discussed with review of all pertinent labs and imaging. Please see his/her note for further details regarding further ED course and disposition.         Final Clinical Impression(s) / ED Diagnoses Final diagnoses:  None    Rx / DC Orders ED Discharge Orders     None         Dyann Ruddle 10/09/21 1641    Valarie Merino, MD 10/13/21 1531

## 2021-10-09 NOTE — ED Provider Notes (Signed)
Care transferred from previous provider at shift change.  See note for full HPI.  In summation 76 year old chronic right lower extremity deficits from polio, hypertension who presents today for intermittent headaches over the last few months.  Had been worked up by PCP for this.  Uses a cane at baseline to walk intermittently however today has had to use it persistently.  No sudden onset thunderclap headache.  No new deficits, weakness, facial droop, difficulty word finding.  Labs without significant abnormality  Plan to follow-up on MRI.  Previous provider did not think CTA head/neck useful at this time due to exam and history.  IF MR neg dc home with Neuro outpatient FU Physical Exam  BP 140/80   Pulse 80   Temp 97.8 F (36.6 C)   Resp 15   Ht 5' (1.524 m)   Wt 72.6 kg   SpO2 98%   BMI 31.25 kg/m   Physical Exam Vitals and nursing note reviewed.  Constitutional:      General: She is not in acute distress.    Appearance: She is well-developed. She is not ill-appearing or diaphoretic.  HENT:     Head: Atraumatic.  Eyes:     Pupils: Pupils are equal, round, and reactive to light.  Cardiovascular:     Rate and Rhythm: Normal rate and regular rhythm.  Pulmonary:     Effort: Pulmonary effort is normal. No respiratory distress.  Abdominal:     General: There is no distension.     Palpations: Abdomen is soft.  Musculoskeletal:        General: Normal range of motion.     Cervical back: Normal range of motion and neck supple.  Skin:    General: Skin is warm and dry.  Neurological:     General: No focal deficit present.     Mental Status: She is alert and oriented to person, place, and time.     Comments: Cn 2-12 grossly intact Equal hand grip Ambulatory with cane, no ataxia    Procedures  Procedures Labs Reviewed  BASIC METABOLIC PANEL - Abnormal; Notable for the following components:      Result Value   Potassium 3.2 (*)    Glucose, Bld 136 (*)    Creatinine, Ser  1.08 (*)    Calcium 10.5 (*)    GFR, Estimated 53 (*)    All other components within normal limits  CBC - Abnormal; Notable for the following components:   RDW 15.8 (*)    All other components within normal limits  URINALYSIS, ROUTINE W REFLEX MICROSCOPIC - Abnormal; Notable for the following components:   APPearance HAZY (*)    Leukocytes,Ua LARGE (*)    Bacteria, UA RARE (*)    All other components within normal limits  HEPATIC FUNCTION PANEL   MR BRAIN WO CONTRAST  Result Date: 10/09/2021 CLINICAL DATA:  Neuro deficit, acute, stroke suspected EXAM: MRI HEAD WITHOUT CONTRAST TECHNIQUE: Multiplanar, multiecho pulse sequences of the brain and surrounding structures were obtained without intravenous contrast. COMPARISON:  2016 FINDINGS: Brain: There is no acute infarction or intracranial hemorrhage. There is no intracranial mass, mass effect, or edema. There is no hydrocephalus or extra-axial fluid collection. Prominence of the ventricles and sulci reflects minor parenchymal volume loss. Patchy foci of T2 hyperintensity in the supratentorial white matter are nonspecific but may reflect mild chronic microvascular ischemic changes. Vascular: Major vessel flow voids at the skull base are preserved. Skull and upper cervical spine: Normal marrow signal is  preserved. Cervical spine degenerative changes. Sinuses/Orbits: Paranasal sinuses are aerated. Orbits are unremarkable. Other: Sella is unremarkable.  Mastoid air cells are clear. IMPRESSION: No acute infarction, hemorrhage, or mass. Mild chronic microvascular ischemic changes. Electronically Signed   By: Macy Mis M.D.   On: 10/09/2021 17:38    ED Course / MDM   Clinical Course as of 10/09/21 1801  Sat Oct 09, 2021  1614 FU on MRI, if neg dc home with Neuro FU [BH]    Clinical Course User Index [BH] Annelle Behrendt A, PA-C   Care transferred from previous provider at shift change.  See note for full HPI.  In summation 76 year old  history of ongoing headaches over the last few months, currently being worked up by PCP.  Apparently yesterday had needed to use her cane more than normal.  Previous provider noted a nonfocal neuro exam aside from her baseline.  Plan is for MRI.  If negative may DC home with follow-up with neurology.  Appears provider did not think CTA head and neck was needed at this time.  Labs and imaging personally reviewed and interpreted:  CBC without leukocytosis Metabolic panel negative for infection MRI brain without significant normality  I was alerted by nursing staff that when patient returned from MRI she had dressed herself, removed her IV and stated she was leaving.  States she had been here for over 6 hours and she no longer wanted to wait.  Was able to see patient as she was leaving the emergency department.  I discussed her MRI.  I did offer CTA given her earlier dizziness yesterday which patient was adamant that she was leaving the emergency department and she would follow-up outpatient.  I have placed a referral to neurology for further work-up of her headaches.  She will return for new or worsening symptoms.  The patient has been appropriately medically screened and/or stabilized in the ED. I have low suspicion for any other emergent medical condition which would require further screening, evaluation or treatment in the ED or require inpatient management.  Patient is hemodynamically stable and in no acute distress.  Patient able to ambulate in department prior to ED.  Evaluation does not show acute pathology that would require ongoing or additional emergent interventions while in the emergency department or further inpatient treatment.  I have discussed the diagnosis with the patient and answered all questions.  Pain is been managed while in the emergency department and patient has no further complaints prior to discharge.  Patient is comfortable with plan discussed in room and is stable for  discharge at this time.  I have discussed strict return precautions for returning to the emergency department.  Patient was encouraged to follow-up with PCP/specialist refer to at discharge.    Medical Decision Making Amount and/or Complexity of Data Reviewed Labs: ordered. Radiology: ordered.  Risk Prescription drug management.    Headache Chronic right sided deficits History of Polio Hypokalemia      Kamar Callender A, PA-C 10/09/21 1801    Valarie Merino, MD 10/13/21 [phone]

## 2021-10-09 NOTE — ED Notes (Signed)
Pt back from MRI at this time. Pt states she is ready to go home and does not want to wait on her results. States we can send them through [employer]

## 2021-10-09 NOTE — ED Notes (Signed)
Patient transported to MRI 

## 2021-10-09 NOTE — ED Triage Notes (Signed)
Pt reports intermittent headaches since February.  States in late March it turned into "head pain" instead of what she calls headaches.  Also reports intermittent R sided neck pain.  Pt had CT at [facility] on Thursday and states she hasn't received results.  Reports dizziness worse since yesterday and "3 seconds" of not being able to focus this morning.  No arm drift.   No neuro deficits on triage exam.

## 2021-10-09 NOTE — Discharge Instructions (Signed)
I have placed a referral to neurology.  They should call you to schedule appointment  Return for any worsening symptoms

## 2021-12-21 DIAGNOSIS — R0609 Other forms of dyspnea: Secondary | ICD-10-CM | POA: Insufficient documentation

## 2021-12-21 DIAGNOSIS — R5383 Other fatigue: Secondary | ICD-10-CM | POA: Insufficient documentation

## 2021-12-28 ENCOUNTER — Ambulatory Visit: Payer: Medicare HMO | Admitting: Orthopaedic Surgery

## 2022-01-28 ENCOUNTER — Ambulatory Visit (INDEPENDENT_AMBULATORY_CARE_PROVIDER_SITE_OTHER): Payer: Medicare HMO

## 2022-01-28 ENCOUNTER — Encounter: Payer: Self-pay | Admitting: Orthopaedic Surgery

## 2022-01-28 ENCOUNTER — Ambulatory Visit: Payer: Medicare HMO | Admitting: Orthopaedic Surgery

## 2022-01-28 VITALS — BP 120/77 | HR 71 | Ht 60.0 in | Wt 160.0 lb

## 2022-01-28 DIAGNOSIS — M48061 Spinal stenosis, lumbar region without neurogenic claudication: Secondary | ICD-10-CM | POA: Diagnosis not present

## 2022-01-28 DIAGNOSIS — M545 Low back pain, unspecified: Secondary | ICD-10-CM

## 2022-01-28 DIAGNOSIS — G8929 Other chronic pain: Secondary | ICD-10-CM | POA: Diagnosis not present

## 2022-01-28 MED ORDER — ACETAMINOPHEN-CODEINE 300-30 MG PO TABS: 1.0000 | ORAL_TABLET | Freq: Three times a day (TID) | ORAL | 0 refills | Status: DC | PRN

## 2022-01-28 NOTE — Progress Notes (Signed)
Office Visit Note   Patient: [name]           Date of Birth: [birth year]           MRN: [id number] Visit Date: 01/28/2022              Requested by: Tracey Harries, MD [address] [place],  [place] [address] PCP: Tracey Harries, MD   Assessment & Plan: Visit Diagnoses:  1. Chronic bilateral low back pain, unspecified whether sciatica present     Plan: We will set her up for repeat injection with Dr. Alvester Morin which did well 14 months ago.  If she has ongoing problems she may require reimaging.  Her 2021 image scans were similar to 2019 previously.  Follow-Up Instructions: No follow-ups on file.   Orders:  Orders Placed This Encounter  Procedures   XR Lumbar Spine 2-3 Views   No orders of the defined types were placed in this encounter.     Procedures: No procedures performed   Clinical Data: No additional findings.   Subjective: Chief Complaint  Patient presents with   Lower Back - Pain    HPI 76 year old female returns with increased problems with back pain and leg pain.  She had polio as a child tendon transfers on the right leg and growth arrest left distal femur.  She has had increased pain in her back and legs similar to her problem that responded well to epidural injection left L5-S1 by Dr. Alvester Morin 14 months ago.  She has had previous surgery L5-S1.  She has been using some Tylenol 3 and still has 2 tablets left since her prescription in February.  She is requesting a refill.  Previous lumbar surgery with synovial cyst.  Review of Systems all other systems noncontributory to HPI.   Objective: Vital Signs: BP 120/77   Pulse 71   Ht 5' (1.524 m)   Wt 160 lb (72.6 kg)   BMI 31.25 kg/m   Physical Exam Constitutional:      Appearance: She is well-developed.  HENT:     Head: Normocephalic.     Right Ear: External ear normal.     Left Ear: External ear normal. There is no impacted cerumen.  Eyes:     Pupils: Pupils are equal, round,  and reactive to light.  Neck:     Thyroid: No thyromegaly.     Trachea: No tracheal deviation.  Cardiovascular:     Rate and Rhythm: Normal rate.  Pulmonary:     Effort: Pulmonary effort is normal.  Abdominal:     Palpations: Abdomen is soft.  Musculoskeletal:     Cervical back: No rigidity.  Skin:    General: Skin is warm and dry.  Neurological:     Mental Status: She is alert and oriented to person, place, and time.  Psychiatric:        Behavior: Behavior normal.     Ortho Exam patient has short femur on the right and longer to be on the left.  Pelvis is close to level.  She is ambulatory with a cane.  She has sciatic notch tenderness more on the left than right well-healed lumbar incision no cellulitis no pitting edema lower extremities.  Specialty Comments:  No specialty comments available.  Imaging: No results found.   PMFS History: Patient Active Problem List   Diagnosis Date Noted   Chronic sciatica of left side 04/01/2015   HNP (herniated nucleus pulposus), lumbar 02/21/2014  Synovial cyst of lumbar spine 02/21/2014   Lumbar foraminal stenosis 02/21/2014   Past Medical History:  Diagnosis Date   Anxiety    Bronchitis due to tobacco use    quit smoking 3 months ago   Chronic sciatica of left side 04/01/2015   COPD (chronic obstructive pulmonary disease) (HCC)    Depression    GERD (gastroesophageal reflux disease)    Hypertension    Mitral valve prolapse    Pneumonia    years ago   Polio    age 69 years old    Family History  Problem Relation Age of Onset   Cancer Mother        thyroid   Heart attack Father    Cancer Sister        breast   Kidney disease Sister        ESRD   Irritable bowel syndrome Sister    Cancer Sister        thyroid   Colon cancer Neg Hx    Esophageal cancer Neg Hx     Past Surgical History:  Procedure Laterality Date   ABDOMINAL HYSTERECTOMY  1985   BACK SURGERY     COLONOSCOPY  2017   LUMBAR LAMINECTOMY Left  02/21/2014   Procedure: Left L5-S1 Hemilaminectomy, Lateral Recess Decompression, Removal Synovial Cyst;  Surgeon: Eldred Manges, MD;  Location: [facility] OR;  Service: Orthopedics;  Laterality: Left;   MICROLARYNGOSCOPY N/A 10/11/2018   Procedure: MICROLARYNGOSCOPY with Biopsy;  Surgeon: Serena Colonel, MD;  Location: [facility] OR;  Service: ENT;  Laterality: N/A;   polio Bilateral 1950's   surgery on feet and legs for polio   POLIO SURGERY     TONSILLECTOMY     Social History   Occupational History   Occupation: Retired    Associate Professor: [employer]  Tobacco Use   Smoking status: Every Day    Packs/day: 1.50    Years: 54.00    Total pack years: 81.00    Types: Cigarettes   Smokeless tobacco: Never   Tobacco comments:    currently 2 packs per day  Vaping Use   Vaping Use: Never used  Substance and Sexual Activity   Alcohol use: Yes    Alcohol/week: 0.0 standard drinks of alcohol    Comment: occasional   Drug use: No   Sexual activity: Not on file

## 2022-02-01 ENCOUNTER — Telehealth: Payer: Self-pay | Admitting: Physical Medicine and Rehabilitation

## 2022-02-01 NOTE — Telephone Encounter (Signed)
IC talked with patient. She had questions about copay

## 2022-02-01 NOTE — Telephone Encounter (Signed)
Pt states she just spoke to something that set her appt and had additional questions. Please call pt at [phone].

## 2022-02-14 ENCOUNTER — Ambulatory Visit: Payer: Self-pay

## 2022-02-14 ENCOUNTER — Ambulatory Visit: Payer: Medicare HMO | Admitting: Physical Medicine and Rehabilitation

## 2022-02-14 VITALS — BP 136/84 | HR 87 | Wt 160.0 lb

## 2022-02-14 DIAGNOSIS — M5416 Radiculopathy, lumbar region: Secondary | ICD-10-CM

## 2022-02-14 MED ORDER — METHYLPREDNISOLONE ACETATE 80 MG/ML IJ SUSP
40.0000 mg | Freq: Once | INTRAMUSCULAR | Status: AC
  Administered 2022-02-14: 40 mg

## 2022-02-14 NOTE — Procedures (Signed)
Lumbosacral Transforaminal Epidural Steroid Injection - Sub-Pedicular Approach with Fluoroscopic Guidance  Patient: [name]      Date of Birth: 1945-08-01 MRN: [id number] PCP: Bernerd Limbo, MD      Visit Date: 02/14/2022   Universal Protocol:    Date/Time: 02/14/2022  Consent Given By: the patient  Position: PRONE  Additional Comments: Vital signs were monitored before and after the procedure. Patient was prepped and draped in the usual sterile fashion. The correct patient, procedure, and site was verified.   Injection Procedure Details:   Procedure diagnoses: Lumbar radiculopathy [M54.16]    Meds Administered:  Meds ordered this encounter  Medications   methylPREDNISolone acetate (DEPO-MEDROL) injection 40 mg    Laterality: Left  Location/Site: L5  Needle:5.0 in., 22 ga.  Short bevel or Quincke spinal needle  Needle Placement: Transforaminal  Findings:    -Comments: Excellent flow of contrast along the nerve, nerve root and into the epidural space.  Procedure Details: After squaring off the end-plates to get a true AP view, the C-arm was positioned so that an oblique view of the foramen as noted above was visualized. The target area is just inferior to the "nose of the scotty dog" or sub pedicular. The soft tissues overlying this structure were infiltrated with 2-3 ml. of 1% Lidocaine without Epinephrine.  The spinal needle was inserted toward the target using a "trajectory" view along the fluoroscope beam.  Under AP and lateral visualization, the needle was advanced so it did not puncture dura and was located close the 6 O'Clock position of the pedical in AP tracterory. Biplanar projections were used to confirm position. Aspiration was confirmed to be negative for CSF and/or blood. A 1-2 ml. volume of Isovue-250 was injected and flow of contrast was noted at each level. Radiographs were obtained for documentation purposes.   After attaining the desired flow of  contrast documented above, a 0.5 to 1.0 ml test dose of 0.25% Marcaine was injected into each respective transforaminal space.  The patient was observed for 90 seconds post injection.  After no sensory deficits were reported, and normal lower extremity motor function was noted,   the above injectate was administered so that equal amounts of the injectate were placed at each foramen (level) into the transforaminal epidural space.   Additional Comments:  The patient tolerated the procedure well Dressing: 2 x 2 sterile gauze and Band-Aid    Post-procedure details: Patient was observed during the procedure. Post-procedure instructions were reviewed.  Patient left the clinic in stable condition.

## 2022-02-14 NOTE — Patient Instructions (Signed)

## 2022-02-14 NOTE — Progress Notes (Signed)
[name] - 76 y.o. female MRN [id number]  Date of birth: September 08, 1945  Office Visit Note: Visit Date: 02/14/2022 PCP: Bernerd Limbo, MD Referred by: Bernerd Limbo, MD  Subjective: Chief Complaint  Patient presents with   Lower Back - Pain   Left Leg - Pain, Numbness   HPI:  [name] is a 76 y.o. female who comes in today at the request of Dr. Rodell Perna for planned Left L5-S1 Lumbar Transforaminal epidural steroid injection with fluoroscopic guidance.  The patient has failed conservative care including home exercise, medications, time and activity modification.  This injection will be diagnostic and hopefully therapeutic.  Please see requesting physician notes for further details and justification.   ROS Otherwise per HPI.  Assessment & Plan: Visit Diagnoses:    ICD-10-CM   1. Lumbar radiculopathy  M54.16 XR C-ARM NO REPORT    Epidural Steroid injection    methylPREDNISolone acetate (DEPO-MEDROL) injection 40 mg      Plan: No additional findings.   Meds & Orders:  Meds ordered this encounter  Medications   methylPREDNISolone acetate (DEPO-MEDROL) injection 40 mg    Orders Placed This Encounter  Procedures   XR C-ARM NO REPORT   Epidural Steroid injection    Follow-up: Return for visit to requesting provider as needed.   Procedures: No procedures performed  Lumbosacral Transforaminal Epidural Steroid Injection - Sub-Pedicular Approach with Fluoroscopic Guidance  Patient: [name]      Date of Birth: [birth year] MRN: [id number] PCP: Bernerd Limbo, MD      Visit Date: 02/14/2022   Universal Protocol:    Date/Time: 02/14/2022  Consent Given By: the patient  Position: PRONE  Additional Comments: Vital signs were monitored before and after the procedure. Patient was prepped and draped in the usual sterile fashion. The correct patient, procedure, and site was verified.   Injection Procedure Details:   Procedure diagnoses: Lumbar radiculopathy  [M54.16]    Meds Administered:  Meds ordered this encounter  Medications   methylPREDNISolone acetate (DEPO-MEDROL) injection 40 mg    Laterality: Left  Location/Site: L5  Needle:5.0 in., 22 ga.  Short bevel or Quincke spinal needle  Needle Placement: Transforaminal  Findings:    -Comments: Excellent flow of contrast along the nerve, nerve root and into the epidural space.  Procedure Details: After squaring off the end-plates to get a true AP view, the C-arm was positioned so that an oblique view of the foramen as noted above was visualized. The target area is just inferior to the "nose of the scotty dog" or sub pedicular. The soft tissues overlying this structure were infiltrated with 2-3 ml. of 1% Lidocaine without Epinephrine.  The spinal needle was inserted toward the target using a "trajectory" view along the fluoroscope beam.  Under AP and lateral visualization, the needle was advanced so it did not puncture dura and was located close the 6 O'Clock position of the pedical in AP tracterory. Biplanar projections were used to confirm position. Aspiration was confirmed to be negative for CSF and/or blood. A 1-2 ml. volume of Isovue-250 was injected and flow of contrast was noted at each level. Radiographs were obtained for documentation purposes.   After attaining the desired flow of contrast documented above, a 0.5 to 1.0 ml test dose of 0.25% Marcaine was injected into each respective transforaminal space.  The patient was observed for 90 seconds post injection.  After no sensory deficits were reported, and normal lower extremity motor function was noted,  the above injectate was administered so that equal amounts of the injectate were placed at each foramen (level) into the transforaminal epidural space.   Additional Comments:  The patient tolerated the procedure well Dressing: 2 x 2 sterile gauze and Band-Aid    Post-procedure details: Patient was observed during the  procedure. Post-procedure instructions were reviewed.  Patient left the clinic in stable condition.    Clinical History: MRI LUMBAR SPINE WITHOUT CONTRAST     TECHNIQUE:  Multiplanar, multisequence MR imaging of the lumbar spine was  performed. No intravenous contrast was administered.     COMPARISON:  Lumbar MRI 05/08/2018     FINDINGS:  Segmentation:  Normal     Alignment:  Mild retrolisthesis L1-2, L2-3, L3-4.     Vertebrae:  Normal bone marrow.  Negative for fracture or mass.     Conus medullaris and cauda equina: Conus extends to the L2 level.  Conus and cauda equina appear normal.     Paraspinal and other soft tissues: Negative for paraspinous mass or  adenopathy     Disc levels:     T11-12: Diffuse disc bulging with mild spinal stenosis     T12-L1: Negative     L1-2: Moderate disc degeneration with disc bulging and endplate  spurring. Mild facet degeneration. Small left-sided disc protrusion  with mild left subarticular stenosis. No interval change.     L2-3: Moderate to advanced disc degeneration with disc space  narrowing, disc bulging and diffuse endplate spurring. Moderate  facet hypertrophy. Mild spinal stenosis and mild subarticular  stenosis bilaterally unchanged.     L3-4: Diffuse disc bulging and moderate facet hypertrophy. Moderate  spinal stenosis and moderate subarticular stenosis bilaterally. No  interval change.     L4-5: Disc degeneration with diffuse disc bulging. Small left  paracentral disc protrusion is unchanged. Bilateral facet  hypertrophy. Moderate subarticular stenosis bilaterally appears  unchanged.     L5-S1: Left laminotomy is chronic and unchanged. Disc degeneration  with diffuse endplate spurring. Moderate subarticular and foraminal  stenosis on the right. Moderate to severe subarticular and foraminal  stenosis on the left due to spurring is unchanged.     IMPRESSION:  Advanced multilevel disc and facet degeneration  throughout the  lumbar spine causing spinal and foraminal stenosis as described  above.     Prior laminectomy left L5-S1. Moderate to severe subarticular and  foraminal stenosis on the left at L5-S1 due to spurring is  unchanged. Left paracentral disc protrusion L4-5 unchanged.     No significant change from the prior MRI 2019.        Electronically Signed    By: Franchot Gallo M.D.    On: 09/18/2019 11:05     Objective:  VS:  HT:    WT:160 lb (72.6 kg)  BMI:31.25    BP:136/84  HR:87bpm  TEMP: ( )  RESP:  Physical Exam Vitals and nursing note reviewed.  Constitutional:      General: She is not in acute distress.    Appearance: Normal appearance. She is not ill-appearing.     Comments: Somnolent with somewhat inappropriate speech.  HENT:     Head: Normocephalic and atraumatic.     Right Ear: External ear normal.     Left Ear: External ear normal.  Eyes:     Extraocular Movements: Extraocular movements intact.  Cardiovascular:     Rate and Rhythm: Normal rate.     Pulses: Normal pulses.  Pulmonary:     Effort: Pulmonary  effort is normal. No respiratory distress.  Abdominal:     General: There is no distension.     Palpations: Abdomen is soft.  Musculoskeletal:        General: Tenderness present.     Cervical back: Neck supple.     Right lower leg: No edema.     Left lower leg: No edema.     Comments: Patient has good distal strength with no pain over the greater trochanters.  No clonus or focal weakness.  Skin:    Findings: No erythema, lesion or rash.  Neurological:     General: No focal deficit present.     Mental Status: She is oriented to person, place, and time.     Sensory: No sensory deficit.     Motor: No weakness or abnormal muscle tone.     Coordination: Coordination normal.  Psychiatric:     Comments: Patient somnolent with somewhat inappropriate speech      Imaging: XR C-ARM NO REPORT  Result Date: 02/14/2022 Please see Notes tab for imaging  impression.

## 2022-03-24 ENCOUNTER — Emergency Department (HOSPITAL_COMMUNITY)
Admission: EM | Admit: 2022-03-24 | Discharge: 2022-03-25 | Disposition: A | Discharge status: Home / Self Care | Payer: Medicare HMO | Attending: Emergency Medicine | Admitting: Emergency Medicine

## 2022-03-24 ENCOUNTER — Encounter (HOSPITAL_COMMUNITY): Payer: Self-pay

## 2022-03-24 ENCOUNTER — Other Ambulatory Visit: Payer: Self-pay

## 2022-03-24 DIAGNOSIS — Z79899 Other long term (current) drug therapy: Secondary | ICD-10-CM | POA: Diagnosis not present

## 2022-03-24 DIAGNOSIS — I10 Essential (primary) hypertension: Secondary | ICD-10-CM | POA: Diagnosis present

## 2022-03-24 DIAGNOSIS — J449 Chronic obstructive pulmonary disease, unspecified: Secondary | ICD-10-CM | POA: Diagnosis not present

## 2022-03-24 DIAGNOSIS — Z7982 Long term (current) use of aspirin: Secondary | ICD-10-CM | POA: Insufficient documentation

## 2022-03-24 LAB — CBC WITH DIFFERENTIAL/PLATELET
Abs Immature Granulocytes: 0.02 10*3/uL (ref 0.00–0.07)
Basophils Absolute: 0 10*3/uL (ref 0.0–0.1)
Basophils Relative: 0 %
Eosinophils Absolute: 0.2 10*3/uL (ref 0.0–0.5)
Eosinophils Relative: 3 %
HCT: 37.3 % (ref 36.0–46.0)
Hemoglobin: 12.5 g/dL (ref 12.0–15.0)
Immature Granulocytes: 0 %
Lymphocytes Relative: 12 %
Lymphs Abs: 0.8 10*3/uL (ref 0.7–4.0)
MCH: 30.1 pg (ref 26.0–34.0)
MCHC: 33.5 g/dL (ref 30.0–36.0)
MCV: 89.9 fL (ref 80.0–100.0)
Monocytes Absolute: 0.8 10*3/uL (ref 0.1–1.0)
Monocytes Relative: 12 %
Neutro Abs: 4.9 10*3/uL (ref 1.7–7.7)
Neutrophils Relative %: 73 %
Platelets: 281 10*3/uL (ref 150–400)
RBC: 4.15 MIL/uL (ref 3.87–5.11)
RDW: 15.7 % — ABNORMAL HIGH (ref 11.5–15.5)
WBC: 6.7 10*3/uL (ref 4.0–10.5)
nRBC: 0 % (ref 0.0–0.2)

## 2022-03-24 LAB — COMPREHENSIVE METABOLIC PANEL
ALT: 23 U/L (ref 0–44)
AST: 25 U/L (ref 15–41)
Albumin: 3.6 g/dL (ref 3.5–5.0)
Alkaline Phosphatase: 92 U/L (ref 38–126)
Anion gap: 8 (ref 5–15)
BUN: 13 mg/dL (ref 8–23)
CO2: 28 mmol/L (ref 22–32)
Calcium: 9.7 mg/dL (ref 8.9–10.3)
Chloride: 101 mmol/L (ref 98–111)
Creatinine, Ser: 1.07 mg/dL — ABNORMAL HIGH (ref 0.44–1.00)
GFR, Estimated: 53 mL/min — ABNORMAL LOW (ref >60)
Glucose, Bld: 103 mg/dL — ABNORMAL HIGH (ref 70–99)
Potassium: 3.5 mmol/L (ref 3.5–5.1)
Sodium: 137 mmol/L (ref 135–145)
Total Bilirubin: 0.6 mg/dL (ref 0.3–1.2)
Total Protein: 6.9 g/dL (ref 6.5–8.1)

## 2022-03-24 MED ORDER — ACETAMINOPHEN 500 MG PO TABS
1000.0000 mg | ORAL_TABLET | Freq: Once | ORAL | Status: AC
  Administered 2022-03-24: 1000 mg via ORAL
  Filled 2022-03-24: qty 2

## 2022-03-24 NOTE — ED Provider Triage Note (Signed)
Emergency Medicine Provider Triage Evaluation Note  [name] , a 76 y.o. female  was evaluated in triage.  Pt complains of elevated BP. Report seeing her PCP for physical exam several days ago and was noted to have BP in 833A systolic.  Sts her BP is usually in the 110s.  She now notice her BP is high after several reading at home.  Endorse headache and feeling dehydrated.  Denies fever, chills, cough, cp, sob, abd pain, n/v  Review of Systems  Positive: As above Negative: As above  Physical Exam  BP (!) 149/99   Pulse (!) 110   Temp 98.1 F (36.7 C) (Oral)   Resp 18   SpO2 99%  Gen:   Awake, no distress   Resp:  Normal effort  MSK:   Moves extremities without difficulty  Other:    Medical Decision Making  Medically screening exam initiated at 10:09 PM.  Appropriate orders placed.  [name] was informed that the remainder of the evaluation will be completed by another provider, this initial triage assessment does not replace that evaluation, and the importance of remaining in the ED until their evaluation is complete.     Domenic Moras, PA-C 03/24/22 2211

## 2022-03-24 NOTE — ED Provider Notes (Signed)
[facility]-EMERGENCY DEPT Provider Note   CSN: [id number] Arrival date & time: 03/24/22  2134     History  Chief Complaint  Patient presents with   Hypertension    [name] is a 76 y.o. female.  The history is provided by the patient and medical records. No language interpreter was used.  Hypertension     76 year old female with significant history of hypertension, anxiety, COPD, presenting with concerns of elevated blood pressure.  Patient states she was seen by her primary care provider several days prior for regular checkup.  She was noted to have elevated blood pressure at that time with a systolic blood pressure in the 150s.  She mention her blood pressure usually runs 110 systolic so that value is higher than what she is comfortable with.  Since that visit patient states she just did not feel well.  She endorsed having headache, feeling anxious, and has repeatedly checked depression pressure and noted that it is running in the 140s 150s.  She takes more of a hydrochlorothiazide blood pressure medication in hopes that it will help control blood pressure without relief thus prompting this ER visit.  She does not complain of any chest pain or shortness of breath no focal numbness or focal weakness no change in her diet or environmental changes.  Home Medications Prior to Admission medications   Medication Sig Start Date End Date Taking? Authorizing Provider  acetaminophen-codeine (TYLENOL #3) 300-30 MG tablet Take one at night for severe pain, do not take with sleep medication 07/14/20   Eldred Manges, MD  acetaminophen-codeine (TYLENOL #3) 300-30 MG tablet Take 1 tablet by mouth every 8 (eight) hours as needed for moderate pain. 01/28/22   Eldred Manges, MD  aspirin 325 MG tablet Take 325 mg by mouth daily.    [provider]  atorvastatin (LIPITOR) 40 MG tablet Take 40 mg by mouth daily. 04/08/16   [provider]  bisacodyl (DULCOLAX) 10  MG suppository Place 1 suppository (10 mg total) rectally as needed for moderate constipation. 10/13/18   Sabas Sous, MD  bisacodyl (DULCOLAX) 5 MG EC tablet Take 5 mg by mouth daily as needed for mild constipation or moderate constipation.    [provider]  docusate sodium (COLACE) 100 MG capsule Take 100 mg by mouth daily as needed for mild constipation.    [provider]  DULoxetine (CYMBALTA) 60 MG capsule Take 60 mg by mouth daily. 12/25/13   [provider]  Homeopathic Products (LEG CRAMP RELIEF) SUBL Place 1 tablet under the tongue daily as needed (leg cramps). [employer]    [provider]  hydrochlorothiazide (HYDRODIURIL) 25 MG tablet Take 25 mg by mouth daily.  09/26/17   [provider]  omeprazole (PRILOSEC) 20 MG capsule Take 20 mg by mouth daily.    [provider]  pregabalin (LYRICA) 50 MG capsule Take one at night for one week then one twice daily 09/20/19   Eldred Manges, MD  zolpidem (AMBIEN) 5 MG tablet Take 5 mg by mouth at bedtime as needed for sleep.    [provider]      Allergies    Rosuvastatin, Sulfa antibiotics, and Wellbutrin [bupropion]    Review of Systems   Review of Systems  All other systems reviewed and are negative.   Physical Exam Updated Vital Signs BP (!) 149/99   Pulse (!) 110   Temp 98.1 F (36.7 C) (Oral)  Resp 18   SpO2 99%  Physical Exam Vitals and nursing note reviewed.  Constitutional:      General: She is not in acute distress.    Appearance: She is well-developed.  HENT:     Head: Atraumatic.  Eyes:     Conjunctiva/sclera: Conjunctivae normal.  Cardiovascular:     Rate and Rhythm: Normal rate and regular rhythm.     Pulses: Normal pulses.     Heart sounds: Normal heart sounds.  Pulmonary:     Effort: Pulmonary effort is normal.     Breath sounds: No wheezing, rhonchi or rales.  Abdominal:     Palpations: Abdomen is soft.     Tenderness: There is no  abdominal tenderness.  Musculoskeletal:     Cervical back: Neck supple.     Right lower leg: No edema.     Left lower leg: No edema.  Skin:    Findings: No rash.  Neurological:     Mental Status: She is alert and oriented to person, place, and time.  Psychiatric:        Mood and Affect: Mood normal.     ED Results / Procedures / Treatments   Labs (all labs ordered are listed, but only abnormal results are displayed) Labs Reviewed  COMPREHENSIVE METABOLIC PANEL - Abnormal; Notable for the following components:      Result Value   Glucose, Bld 103 (*)    Creatinine, Ser 1.07 (*)    GFR, Estimated 53 (*)    All other components within normal limits  CBC WITH DIFFERENTIAL/PLATELET - Abnormal; Notable for the following components:   RDW 15.7 (*)    All other components within normal limits  URINALYSIS, ROUTINE W REFLEX MICROSCOPIC - Abnormal; Notable for the following components:   Color, Urine STRAW (*)    Specific Gravity, Urine 1.004 (*)    Leukocytes,Ua LARGE (*)    WBC, UA >50 (*)    All other components within normal limits    EKG None ED ECG REPORT   Date: 03/24/2022  Rate: 111  Rhythm: sinus tachycardia  QRS Axis: normal  Intervals: normal  ST/T Wave abnormalities: normal  Conduction Disutrbances:none  Narrative Interpretation:   Old EKG Reviewed: unchanged  I have personally reviewed the EKG tracing and agree with the computerized printout as noted.   Radiology No results found.  Procedures Procedures    Medications Ordered in ED Medications  acetaminophen (TYLENOL) tablet 1,000 mg (1,000 mg Oral Given 03/24/22 2358)    ED Course/ Medical Decision Making/ A&P                           Medical Decision Making Amount and/or Complexity of Data Reviewed Labs: ordered. ECG/medicine tests: ordered.   BP (!) 147/79   Pulse (!) 109   Temp 98.1 F (36.7 C)   Resp 18   SpO2 95%   59:53 PM  76 year old female with significant history of  hypertension, anxiety, COPD, presenting with concerns of elevated blood pressure.  Patient states she was seen by her primary care provider several days prior for regular checkup.  She was noted to have elevated blood pressure at that time with a systolic blood pressure in the 150s.  She mention her blood pressure usually runs 355 systolic so that value is higher than what she is comfortable with.  Since that visit patient states she just did not feel well.  She endorsed having  headache, feeling anxious, and has repeatedly checked depression pressure and noted that it is running in the 140s 150s.  She takes more of a hydrochlorothiazide blood pressure medication in hopes that it will help control blood pressure without relief thus prompting this ER visit.  She does not complain of any chest pain or shortness of breath no focal numbness or focal weakness no change in her diet or environmental changes.  On exam this is an elderly female appears to be in no acute discomfort.  She does not have any nuchal rigidity.  She has no focal neurodeficit on exam.  She is mentating appropriately.  She is mildly tachycardic but otherwise no murmur rubs or gallops and lungs are clear.    -Labs ordered, independently viewed and interpreted by me.  Labs remarkable for reassuring electrolytes, normal H&H, normal WBC.  -The patient was maintained on a cardiac monitor.  I personally viewed and interpreted the cardiac monitored which showed an underlying rhythm of: sinus tachycardia -This patient presents to the ED for concern of HTN, this involves an extensive number of treatment options, and is a complaint that carries with it a high risk of complications and morbidity.  The differential diagnosis includes uncontrolled hypertension, acs, anxiety, stroke -Co morbidities that complicate the patient evaluation includes HTN -Treatment includes tylenol -Reevaluation of the patient after these medicines showed that the patient  improved -PCP office notes or outside notes reviewed -Escalation to admission/observation considered: patients feels much better, is comfortable with discharge, and will follow up with PCP -Prescription medication considered, patient comfortable with continue with her current BP medication and f/u with PCP -Social Determinant of Health considered which includes tobacco use, recommend cessation  Urine was obtained to assess for any potential dehydration however it did not show any dehydration but did show findings suggestive of a possible urinary tract infection with large leukocyte esterase and greater than 50 WBC.  No bacteria seen.  I discussed this with patient but patient denies having any urinary symptoms.  I have low suspicion for UTI.  Urine culture sent.  Patient may follow-up with PCP.         Final Clinical Impression(s) / ED Diagnoses Final diagnoses:  Hypertension, unspecified type    Rx / DC Orders ED Discharge Orders     None         Fayrene Helper, PA-C 03/25/22 0016    Glyn Ade, MD 03/25/22 1600

## 2022-03-24 NOTE — ED Provider Notes (Incomplete)
[facility]-EMERGENCY DEPT Provider Note   CSN: [id number] Arrival date & time: 03/24/22  2134     History {Add pertinent medical, surgical, social history, OB history to HPI:1} Chief Complaint  Patient presents with  . Hypertension    [name] is a 76 y.o. female.  The history is provided by the patient and medical records. No language interpreter was used.  Hypertension     76 year old female with significant history of hypertension, anxiety, COPD, presenting with concerns of elevated blood pressure.  Patient states she was seen by her primary care provider several days prior for regular checkup.  She was noted to have elevated blood pressure at that time with a systolic blood pressure in the 150s.  She mention her blood pressure usually runs 110 systolic so that value is higher than what she is comfortable with.  Since that visit patient states she just did not feel well.  She endorsed having headache, feeling anxious, and has repeatedly checked depression pressure and noted that it is running in the 140s 150s.  She takes more of a hydrochlorothiazide blood pressure medication in hopes that it will help control blood pressure without relief thus prompting this ER visit.  She does not complain of any chest pain or shortness of breath no focal numbness or focal weakness no change in her diet or environmental changes.  Home Medications Prior to Admission medications   Medication Sig Start Date End Date Taking? Authorizing Provider  acetaminophen-codeine (TYLENOL #3) 300-30 MG tablet Take one at night for severe pain, do not take with sleep medication 07/14/20   Eldred Manges, MD  acetaminophen-codeine (TYLENOL #3) 300-30 MG tablet Take 1 tablet by mouth every 8 (eight) hours as needed for moderate pain. 01/28/22   Eldred Manges, MD  aspirin 325 MG tablet Take 325 mg by mouth daily.    [provider]  atorvastatin (LIPITOR) 40 MG tablet Take 40 mg by  mouth daily. 04/08/16   [provider]  bisacodyl (DULCOLAX) 10 MG suppository Place 1 suppository (10 mg total) rectally as needed for moderate constipation. 10/13/18   Sabas Sous, MD  bisacodyl (DULCOLAX) 5 MG EC tablet Take 5 mg by mouth daily as needed for mild constipation or moderate constipation.    [provider]  docusate sodium (COLACE) 100 MG capsule Take 100 mg by mouth daily as needed for mild constipation.    [provider]  DULoxetine (CYMBALTA) 60 MG capsule Take 60 mg by mouth daily. 12/25/13   [provider]  Homeopathic Products (LEG CRAMP RELIEF) SUBL Place 1 tablet under the tongue daily as needed (leg cramps). Hyland Brand    [provider]  hydrochlorothiazide (HYDRODIURIL) 25 MG tablet Take 25 mg by mouth daily.  09/26/17   [provider]  omeprazole (PRILOSEC) 20 MG capsule Take 20 mg by mouth daily.    [provider]  pregabalin (LYRICA) 50 MG capsule Take one at night for one week then one twice daily 09/20/19   Eldred Manges, MD  zolpidem (AMBIEN) 5 MG tablet Take 5 mg by mouth at bedtime as needed for sleep.    [provider]      Allergies    Rosuvastatin, Sulfa antibiotics, and Wellbutrin [bupropion]    Review of Systems   Review of Systems  All other systems reviewed and are negative.   Physical Exam Updated Vital Signs BP (!) 149/99   Pulse Marland Kitchen)  110   Temp 98.1 F (36.7 C) (Oral)   Resp 18   SpO2 99%  Physical Exam Vitals and nursing note reviewed.  Constitutional:      General: She is not in acute distress.    Appearance: She is well-developed.  HENT:     Head: Atraumatic.  Eyes:     Conjunctiva/sclera: Conjunctivae normal.  Cardiovascular:     Rate and Rhythm: Normal rate and regular rhythm.     Pulses: Normal pulses.     Heart sounds: Normal heart sounds.  Pulmonary:     Effort: Pulmonary effort is normal.     Breath sounds: No wheezing, rhonchi or rales.   Abdominal:     Palpations: Abdomen is soft.     Tenderness: There is no abdominal tenderness.  Musculoskeletal:     Cervical back: Neck supple.     Right lower leg: No edema.     Left lower leg: No edema.  Skin:    Findings: No rash.  Neurological:     Mental Status: She is alert and oriented to person, place, and time.  Psychiatric:        Mood and Affect: Mood normal.     ED Results / Procedures / Treatments   Labs (all labs ordered are listed, but only abnormal results are displayed) Labs Reviewed  COMPREHENSIVE METABOLIC PANEL - Abnormal; Notable for the following components:      Result Value   Glucose, Bld 103 (*)    Creatinine, Ser 1.07 (*)    GFR, Estimated 53 (*)    All other components within normal limits  CBC WITH DIFFERENTIAL/PLATELET - Abnormal; Notable for the following components:   RDW 15.7 (*)    All other components within normal limits  URINALYSIS, ROUTINE W REFLEX MICROSCOPIC    EKG None ED ECG REPORT   Date: 03/24/2022  Rate: 111  Rhythm: sinus tachycardia  QRS Axis: normal  Intervals: normal  ST/T Wave abnormalities: normal  Conduction Disutrbances:none  Narrative Interpretation:   Old EKG Reviewed: unchanged  I have personally reviewed the EKG tracing and agree with the computerized printout as noted.   Radiology No results found.  Procedures Procedures  {Document cardiac monitor, telemetry assessment procedure when appropriate:1}  Medications Ordered in ED Medications - No data to display  ED Course/ Medical Decision Making/ A&P                           Medical Decision Making Amount and/or Complexity of Data Reviewed Labs: ordered. ECG/medicine tests: ordered.   BP (!) 149/99   Pulse (!) 110   Temp 98.1 F (36.7 C) (Oral)   Resp 18   SpO2 99%   56:78 PM  76 year old female with significant history of hypertension, anxiety, COPD, presenting with concerns of elevated blood pressure.  Patient states she was seen by  her primary care provider several days prior for regular checkup.  She was noted to have elevated blood pressure at that time with a systolic blood pressure in the 150s.  She mention her blood pressure usually runs 440 systolic so that value is higher than what she is comfortable with.  Since that visit patient states she just did not feel well.  She endorsed having headache, feeling anxious, and has repeatedly checked depression pressure and noted that it is running in the 140s 150s.  She takes more of a hydrochlorothiazide blood pressure medication in hopes that it  will help control blood pressure without relief thus prompting this ER visit.  She does not complain of any chest pain or shortness of breath no focal numbness or focal weakness no change in her diet or environmental changes.  On exam this is an elderly female appears to be in no acute discomfort.  She does not have any nuchal rigidity.  She has no focal neurodeficit on exam.  She is mentating appropriately.  She is mildly tachycardic but otherwise no murmur rubs or gallops and lungs are clear.    -Labs ordered, independently viewed and interpreted by me.  Labs remarkable for reassuring electrolytes, normal H&H, normal WBC.  -The patient was maintained on a cardiac monitor.  I personally viewed and interpreted the cardiac monitored which showed an underlying rhythm of: sinus tachycardia -This patient presents to the ED for concern of HTN, this involves an extensive number of treatment options, and is a complaint that carries with it a high risk of complications and morbidity.  The differential diagnosis includes uncontrolled hypertension, acs, anxiety, stroke -Co morbidities that complicate the patient evaluation includes HTN -Treatment includes tylenol -Reevaluation of the patient after these medicines showed that the patient improved -PCP office notes or outside notes reviewed -Escalation to admission/observation considered: patients feels  much better, is comfortable with discharge, and will follow up with PCP -Prescription medication considered, patient comfortable with continue with her current BP medication and f/u with PCP -Social Determinant of Health considered which includes tobacco use, recommend cessation   {Document critical care time when appropriate:1} {Document review of labs and clinical decision tools ie heart score, Chads2Vasc2 etc:1}  {Document your independent review of radiology images, and any outside records:1} {Document your discussion with family members, caretakers, and with consultants:1} {Document social determinants of health affecting pt's care:1} {Document your decision making why or why not admission, treatments were needed:1} Final Clinical Impression(s) / ED Diagnoses Final diagnoses:  None    Rx / DC Orders ED Discharge Orders     None

## 2022-03-24 NOTE — ED Triage Notes (Addendum)
Pt c/o her BP being higher than normal. Pt states she checks it at home, usually runs 468 systolic. Pt c/o today running 032-122 systolic. BP 149/99 currently in triage. Pt states she takes hydrochlorothiazide once daily but has taken more tablets today due to her BP, 4 total today, 10 mg each.

## 2022-03-25 LAB — URINALYSIS, ROUTINE W REFLEX MICROSCOPIC
Bacteria, UA: NONE SEEN
Bilirubin Urine: NEGATIVE
Glucose, UA: NEGATIVE mg/dL
Hgb urine dipstick: NEGATIVE
Ketones, ur: NEGATIVE mg/dL
Nitrite: NEGATIVE
Protein, ur: NEGATIVE mg/dL
Specific Gravity, Urine: 1.004 — ABNORMAL LOW (ref 1.005–1.030)
WBC, UA: >50 WBC/hpf — ABNORMAL HIGH (ref 0–5)
pH: 7 (ref 5.0–8.0)

## 2022-03-25 NOTE — Discharge Instructions (Signed)
Please follow-up with your doctor for further managements of your elevated blood pressure.  Your heart rate is elevated today, have it rechecked by your doctor as well.  Return if you have any concern.

## 2022-05-09 ENCOUNTER — Ambulatory Visit
Admission: RE | Admit: 2022-05-09 | Discharge: 2022-05-09 | Disposition: A | Discharge status: Home / Self Care | Payer: Medicare HMO | Source: Ambulatory Visit | Attending: Acute Care | Admitting: Acute Care

## 2022-05-09 DIAGNOSIS — F17210 Nicotine dependence, cigarettes, uncomplicated: Secondary | ICD-10-CM

## 2022-05-09 DIAGNOSIS — Z87891 Personal history of nicotine dependence: Secondary | ICD-10-CM

## 2022-05-11 ENCOUNTER — Telehealth: Payer: Self-pay | Admitting: Acute Care

## 2022-05-11 DIAGNOSIS — R911 Solitary pulmonary nodule: Secondary | ICD-10-CM

## 2022-05-11 NOTE — Telephone Encounter (Signed)
I have attempted to call the patient with the results of their  Low Dose CT Chest Lung cancer screening scan. There was no answer. I have left a HIPPA compliant VM requesting the patient call the office for the scan results. I included the office contact information in the message. We will await his return call. If no return call we will continue to call until patient is contacted.    Patient scan was read as a lung RADS 4B, she has an irregular nodular opacity in the left upper lobe that has increased in size when compared to previous exams.  Currently measuring 9.4 mm, it previously measured 3.1 mm.  Additionally she has a new endobronchial nodule in the right middle lobe measuring 6.1 mm. I have reviewed the scan with Dr. Jayme Cloud who agrees the patient needs to have a PET scan and most likely based on PET scan results a robotic bronchoscopy and biopsy.  When I tried to call the patient there was a message on her phone that said this patient does not use voicemail hang up and text. I did leave a message as I do not like to text healthcare information. Angelique Blonder I have not placed the order for the PET scan because my concern is she will get a call to schedule it before we have discussed the results of her scan with her. Please try to get her called tomorrow if at all possible so that we can go over the results and get her PET scan scheduled. Thanks so much.

## 2022-05-11 NOTE — Telephone Encounter (Signed)
Called and spoke to MJ at [facility] Radiology:  IMPRESSION: 1. Irregular nodular opacity of the left upper lobe is increased in size when compared with prior exam, measuring approximately 9.4 mm. Lung-RADS 4B, suspicious. Additional imaging evaluation or consultation with Pulmonology or Thoracic Surgery recommended. 2. Additional new endobronchial nodule of the right middle lobe, likely due to mucous plugging. Recommend attention on follow-up. 3. Aortic Atherosclerosis (ICD10-I70.0) and Emphysema (ICD10-J43.9).

## 2022-05-12 NOTE — Telephone Encounter (Signed)
Attempted to contact pt again but unable to speak with her. I left a voicemail message with pt's daughter, Karilyn Cota [facility]) asking her to give [place] a call or have pt call us to discuss CT results.

## 2022-05-12 NOTE — Telephone Encounter (Signed)
Noted by clinical staff.

## 2022-05-12 NOTE — Telephone Encounter (Signed)
Attempted to contact pt. Left detailed message on her voicemail to call us back an explained that we are not able to text.

## 2022-05-18 NOTE — Telephone Encounter (Signed)
Patient left VM on [facility] line.  Returned call but call immediately went to her VM. Left a message suggesting if she calls back and has to leave a VM for [facility], to leave a best time to call so we can connect with her for results of LDCT.

## 2022-05-19 NOTE — Addendum Note (Signed)
Addended by: Abigail Miyamoto D on: 05/19/2022 09:49 AM   Modules accepted: Orders

## 2022-05-19 NOTE — Telephone Encounter (Signed)
Spoke with pt and and advised of CT results per Kandice Robinsons, NP. Pt advised that we will order the PET scan and one this is scheduled we will schedule an office follow up to go over PET scan results. Pt verbalized understanding and is aware she will get a call to schedule PET scan. CT results faxed to PCP with follow up plans included.

## 2022-06-08 ENCOUNTER — Encounter (HOSPITAL_COMMUNITY)
Admission: RE | Admit: 2022-06-08 | Discharge: 2022-06-08 | Disposition: A | Discharge status: Home / Self Care | Payer: Medicare HMO | Source: Ambulatory Visit | Attending: Acute Care | Admitting: Acute Care

## 2022-06-08 DIAGNOSIS — R911 Solitary pulmonary nodule: Secondary | ICD-10-CM | POA: Insufficient documentation

## 2022-06-08 LAB — GLUCOSE, CAPILLARY: Glucose-Capillary: 111 mg/dL — ABNORMAL HIGH (ref 70–99)

## 2022-06-08 MED ORDER — FLUDEOXYGLUCOSE F - 18 (FDG) INJECTION: 8.5000 | Freq: Once | INTRAVENOUS | Status: DC | PRN

## 2022-06-08 MED ORDER — FLUDEOXYGLUCOSE F - 18 (FDG) INJECTION
8.4800 | Freq: Once | INTRAVENOUS | Status: AC
  Administered 2022-06-08: 8.48 via INTRAVENOUS

## 2022-06-09 ENCOUNTER — Ambulatory Visit (INDEPENDENT_AMBULATORY_CARE_PROVIDER_SITE_OTHER): Payer: Medicare HMO | Admitting: Pulmonary Disease

## 2022-06-09 ENCOUNTER — Encounter: Payer: Self-pay | Admitting: Pulmonary Disease

## 2022-06-09 ENCOUNTER — Telehealth: Payer: Self-pay | Admitting: Acute Care

## 2022-06-09 VITALS — BP 120/80 | HR 73 | Ht 62.0 in | Wt 166.8 lb

## 2022-06-09 DIAGNOSIS — C3412 Malignant neoplasm of upper lobe, left bronchus or lung: Secondary | ICD-10-CM | POA: Insufficient documentation

## 2022-06-09 DIAGNOSIS — R9389 Abnormal findings on diagnostic imaging of other specified body structures: Secondary | ICD-10-CM

## 2022-06-09 DIAGNOSIS — R942 Abnormal results of pulmonary function studies: Secondary | ICD-10-CM

## 2022-06-09 DIAGNOSIS — R911 Solitary pulmonary nodule: Secondary | ICD-10-CM

## 2022-06-09 DIAGNOSIS — Z720 Tobacco use: Secondary | ICD-10-CM | POA: Diagnosis not present

## 2022-06-09 NOTE — Telephone Encounter (Signed)
I have called the patient with the results of her PET scan done 06/08/2022. The scan was positive for Peripheral ground-glass density LEFT upper lobe measures 3 cm x 1.3 cm and has intense metabolic activity with SUV max equal 7.5. I told her that the most likley next step will be for biopsy. She has been added to Dr. Fabio Bering schedule for today at 11:30 to be seen.  [name], please notify her PCP of the PET results and that she is going to be evaluated for biopsy by Dr. Valeta Harms today. Thanks so much

## 2022-06-09 NOTE — H&P (View-Only) (Signed)
Synopsis: Referred in January 2024 for lung nodule by Bernerd Limbo, MD  Subjective:   PATIENT ID: [name] GENDER: female DOB: 1946/01/30, MRN: [id number]  Chief Complaint  Patient presents with   Consult    PETscan results    This is a 77 year old female, past medical history of tobacco use, enrolled in lung cancer screening program.  Patient is a longtime smoker with a total 80+ pack year history.  She had an abnormal lung cancer screening CT with a left upper lobe nodule.  This was hypermetabolic on PET scan.There is a hypermetabolic groundglass lesion in the left upper lobe concerning for primary bronchogenic carcinoma.  No evidence of distant or nodal metastatic disease.    Past Medical History:  Diagnosis Date   Anxiety    Bronchitis due to tobacco use    quit smoking 3 months ago   Chronic sciatica of left side 04/01/2015   COPD (chronic obstructive pulmonary disease) (HCC)    Depression    GERD (gastroesophageal reflux disease)    Hypertension    Mitral valve prolapse    Pneumonia    years ago   69    age 51 years old     Family History  Problem Relation Age of Onset   Cancer Mother        thyroid   Heart attack Father    Cancer Sister        breast   Kidney disease Sister        ESRD   Irritable bowel syndrome Sister    Cancer Sister        thyroid   Colon cancer Neg Hx    Esophageal cancer Neg Hx      Past Surgical History:  Procedure Laterality Date   ABDOMINAL HYSTERECTOMY  1985   BACK SURGERY     COLONOSCOPY  2017   LUMBAR LAMINECTOMY Left 02/21/2014   Procedure: Left L5-S1 Hemilaminectomy, Lateral Recess Decompression, Removal Synovial Cyst;  Surgeon: Marybelle Killings, MD;  Location: [facility];  Service: Orthopedics;  Laterality: Left;   MICROLARYNGOSCOPY N/A 10/11/2018   Procedure: MICROLARYNGOSCOPY with Biopsy;  Surgeon: Izora Gala, MD;  Location: [facility];  Service: ENT;  Laterality: N/A;   polio Bilateral 1950's   surgery on feet and legs for  polio   POLIO SURGERY     TONSILLECTOMY      Social History   Socioeconomic History   Marital status: Married    Spouse name: Herbie [place]   Number of children: 1   Years of education: [employer] education level: Not on file  Occupational History   Occupation: Retired    Fish farm manager: [employer]  Tobacco Use   Smoking status: Every Day    Packs/day: 1.50    Years: 54.00    Total pack years: 81.00    Types: Cigarettes   Smokeless tobacco: Never   Tobacco comments:    currently 2 packs per day. 06/09/22 Tay  Vaping Use   Vaping Use: Never used  Substance and Sexual Activity   Alcohol use: Yes    Alcohol/week: 0.0 standard drinks of alcohol    Comment: occasional   Drug use: No   Sexual activity: Not on file  Other Topics Concern   Not on file  Social History Narrative   Patient lives at home with her spouse.   Caffeine Use: daily   Social Determinants of Health   Financial Resource Strain: Not on file  Food Insecurity: Not  on file  Transportation Needs: Not on file  Physical Activity: Not on file  Stress: Not on file  Social Connections: Not on file  Intimate Partner Violence: Not on file     Allergies  Allergen Reactions   Rosuvastatin Rash   Sulfa Antibiotics Other (See Comments)   Wellbutrin [Bupropion] Other (See Comments)    Hallucinations     Outpatient Medications Prior to Visit  Medication Sig Dispense Refill   acetaminophen-codeine (TYLENOL #3) 300-30 MG tablet Take one at night for severe pain, do not take with sleep medication 20 tablet 0   acetaminophen-codeine (TYLENOL #3) 300-30 MG tablet Take 1 tablet by mouth every 8 (eight) hours as needed for moderate pain. 30 tablet 0   aspirin 325 MG tablet Take 325 mg by mouth daily.     atorvastatin (LIPITOR) 40 MG tablet Take 40 mg by mouth daily.     bisacodyl (DULCOLAX) 10 MG suppository Place 1 suppository (10 mg total) rectally as needed for moderate constipation. 12 suppository 0   bisacodyl (DULCOLAX)  5 MG EC tablet Take 5 mg by mouth daily as needed for mild constipation or moderate constipation.     docusate sodium (COLACE) 100 MG capsule Take 100 mg by mouth daily as needed for mild constipation.     DULoxetine (CYMBALTA) 60 MG capsule Take 60 mg by mouth daily.     Homeopathic Products (LEG CRAMP RELIEF) SUBL Place 1 tablet under the tongue daily as needed (leg cramps). [employer]     hydrochlorothiazide (HYDRODIURIL) 25 MG tablet Take 25 mg by mouth daily.      omeprazole (PRILOSEC) 20 MG capsule Take 20 mg by mouth daily.     pregabalin (LYRICA) 50 MG capsule Take one at night for one week then one twice daily 40 capsule 0   zolpidem (AMBIEN) 5 MG tablet Take 5 mg by mouth at bedtime as needed for sleep.     Facility-Administered Medications Prior to Visit  Medication Dose Route Frequency Provider Last Rate Last Admin   0.9 %  sodium chloride infusion  500 mL Intravenous Once Cirigliano, Vito V, DO        Review of Systems  Constitutional:  Negative for chills, fever, malaise/fatigue and weight loss.  HENT:  Negative for hearing loss, sore throat and tinnitus.   Eyes:  Negative for blurred vision and double vision.  Respiratory:  Negative for cough, hemoptysis, sputum production, shortness of breath, wheezing and stridor.   Cardiovascular:  Negative for chest pain, palpitations, orthopnea, leg swelling and PND.  Gastrointestinal:  Negative for abdominal pain, constipation, diarrhea, heartburn, nausea and vomiting.  Genitourinary:  Negative for dysuria, hematuria and urgency.  Musculoskeletal:  Negative for joint pain and myalgias.  Skin:  Negative for itching and rash.  Neurological:  Negative for dizziness, tingling, weakness and headaches.  Endo/Heme/Allergies:  Negative for environmental allergies. Does not bruise/bleed easily.  Psychiatric/Behavioral:  Negative for depression. The patient is not nervous/anxious and does not have insomnia.   All other systems reviewed and are  negative.    Objective:  Physical Exam Vitals reviewed.  Constitutional:      General: She is not in acute distress.    Appearance: She is well-developed.  HENT:     Head: Normocephalic and atraumatic.  Eyes:     General: No scleral icterus.    Conjunctiva/sclera: Conjunctivae normal.     Pupils: Pupils are equal, round, and reactive to light.  Neck:     Vascular:  No JVD.     Trachea: No tracheal deviation.  Cardiovascular:     Rate and Rhythm: Normal rate and regular rhythm.     Heart sounds: Normal heart sounds. No murmur heard. Pulmonary:     Effort: Pulmonary effort is normal. No tachypnea, accessory muscle usage or respiratory distress.     Breath sounds: No stridor. No wheezing, rhonchi or rales.  Abdominal:     General: There is no distension.     Palpations: Abdomen is soft.     Tenderness: There is no abdominal tenderness.  Musculoskeletal:        General: No tenderness.     Cervical back: Neck supple.  Lymphadenopathy:     Cervical: No cervical adenopathy.  Skin:    General: Skin is warm and dry.     Capillary Refill: Capillary refill takes less than 2 seconds.     Findings: No rash.  Neurological:     Mental Status: She is alert and oriented to person, place, and time.  Psychiatric:        Behavior: Behavior normal.      Vitals:   06/09/22 1202  BP: 120/80  Pulse: 73  SpO2: 96%  Weight: 166 lb 12.8 oz (75.7 kg)  Height: 5\' 2"  (1.575 m)   96% on RA BMI Readings from Last 3 Encounters:  06/09/22 30.51 kg/m  05/09/22 31.09 kg/m  02/14/22 31.25 kg/m   Wt Readings from Last 3 Encounters:  06/09/22 166 lb 12.8 oz (75.7 kg)  05/09/22 170 lb (77.1 kg)  02/14/22 160 lb (72.6 kg)     CBC    Component Value Date/Time   WBC 6.7 03/24/2022 2225   RBC 4.15 03/24/2022 2225   HGB 12.5 03/24/2022 2225   HCT 37.3 03/24/2022 2225   PLT 281 03/24/2022 2225   MCV 89.9 03/24/2022 2225   MCH 30.1 03/24/2022 2225   MCHC 33.5 03/24/2022 2225   RDW  15.7 (H) 03/24/2022 2225   LYMPHSABS 0.8 03/24/2022 2225   MONOABS 0.8 03/24/2022 2225   EOSABS 0.2 03/24/2022 2225   BASOSABS 0.0 03/24/2022 2225    Chest Imaging: Nuclear medicine pet imaging 06/08/2022: Hypermetabolic area within the left upper lobe concerning for malignancy.  CT imaging dating back since 2022 and 2023 There is a confluent area within the left upper lobe that has become slightly more solid this is a lesion that corresponds to the hypermetabolic activity seen on the PET scan as above. The patient's images have been independently reviewed by me.    Pulmonary Functions Testing Results:     No data to display          FeNO:   Pathology:   Echocardiogram:   Heart Catheterization:     Assessment & Plan:     ICD-10-CM   1. Lung nodule  R91.1 Ambulatory referral to Pulmonology    Procedural/ Surgical Case Request: ROBOTIC ASSISTED NAVIGATIONAL BRONCHOSCOPY, VIDEO BRONCHOSCOPY WITH ENDOBRONCHIAL ULTRASOUND    CT Super D Chest Wo Contrast    2. Tobacco use  Z72.0     3. Abnormal PET of left lung  R94.2     4. Abnormal CT of the chest  R93.89       Discussion:  This is a 77 year old female who presents with an abnormal lung cancer screening CT and abnormal PET scan.  Over time there is been an area within the left upper lobe that has had more confluence and development to a more solid shaped lesion  over a years timeframe.  This in its conjunction with hypermetabolic uptake on the PET scan is concerning for malignancy.  Plan: Today in the office we talked about the risk benefits alternatives consideration for bronchoscopy and tissue sampling. We talked the risk of bleeding and pneumothorax. Patient is agreeable to proceed with bronchoscopy. Tentative bronchoscopy date will be on 06/28/2022. Appreciate PCC's help with scheduling.    Current Outpatient Medications:    acetaminophen-codeine (TYLENOL #3) 300-30 MG tablet, Take one at night for severe pain,  do not take with sleep medication, Disp: 20 tablet, Rfl: 0   acetaminophen-codeine (TYLENOL #3) 300-30 MG tablet, Take 1 tablet by mouth every 8 (eight) hours as needed for moderate pain., Disp: 30 tablet, Rfl: 0   aspirin 325 MG tablet, Take 325 mg by mouth daily., Disp: , Rfl:    atorvastatin (LIPITOR) 40 MG tablet, Take 40 mg by mouth daily., Disp: , Rfl:    bisacodyl (DULCOLAX) 10 MG suppository, Place 1 suppository (10 mg total) rectally as needed for moderate constipation., Disp: 12 suppository, Rfl: 0   bisacodyl (DULCOLAX) 5 MG EC tablet, Take 5 mg by mouth daily as needed for mild constipation or moderate constipation., Disp: , Rfl:    docusate sodium (COLACE) 100 MG capsule, Take 100 mg by mouth daily as needed for mild constipation., Disp: , Rfl:    DULoxetine (CYMBALTA) 60 MG capsule, Take 60 mg by mouth daily., Disp: , Rfl:    Homeopathic Products (LEG CRAMP RELIEF) SUBL, Place 1 tablet under the tongue daily as needed (leg cramps). [employer], Disp: , Rfl:    hydrochlorothiazide (HYDRODIURIL) 25 MG tablet, Take 25 mg by mouth daily. , Disp: , Rfl:    omeprazole (PRILOSEC) 20 MG capsule, Take 20 mg by mouth daily., Disp: , Rfl:    pregabalin (LYRICA) 50 MG capsule, Take one at night for one week then one twice daily, Disp: 40 capsule, Rfl: 0   zolpidem (AMBIEN) 5 MG tablet, Take 5 mg by mouth at bedtime as needed for sleep., Disp: , Rfl:   Current Facility-Administered Medications:    0.9 %  sodium chloride infusion, 500 mL, Intravenous, Once, Cirigliano, Vito V, DO  I spent 62 minutes dedicated to the care of this patient on the date of this encounter to include pre-visit review of records, face-to-face time with the patient discussing conditions above, post visit ordering of testing, clinical documentation with the electronic health record, making appropriate referrals as documented, and communicating necessary findings to members of the patients care team.    Garner Nash,  DO [facility] 06/09/2022 4:40 PM

## 2022-06-09 NOTE — Progress Notes (Signed)
Synopsis: Referred in January 2024 for lung nodule by Bernerd Limbo, MD  Subjective:   PATIENT ID: [name] GENDER: female DOB: 1946/01/30, MRN: [id number]  Chief Complaint  Patient presents with   Consult    PETscan results    This is a 77 year old female, past medical history of tobacco use, enrolled in lung cancer screening program.  Patient is a longtime smoker with a total 80+ pack year history.  She had an abnormal lung cancer screening CT with a left upper lobe nodule.  This was hypermetabolic on PET scan.There is a hypermetabolic groundglass lesion in the left upper lobe concerning for primary bronchogenic carcinoma.  No evidence of distant or nodal metastatic disease.    Past Medical History:  Diagnosis Date   Anxiety    Bronchitis due to tobacco use    quit smoking 3 months ago   Chronic sciatica of left side 04/01/2015   COPD (chronic obstructive pulmonary disease) (HCC)    Depression    GERD (gastroesophageal reflux disease)    Hypertension    Mitral valve prolapse    Pneumonia    years ago   69    age 51 years old     Family History  Problem Relation Age of Onset   Cancer Mother        thyroid   Heart attack Father    Cancer Sister        breast   Kidney disease Sister        ESRD   Irritable bowel syndrome Sister    Cancer Sister        thyroid   Colon cancer Neg Hx    Esophageal cancer Neg Hx      Past Surgical History:  Procedure Laterality Date   ABDOMINAL HYSTERECTOMY  1985   BACK SURGERY     COLONOSCOPY  2017   LUMBAR LAMINECTOMY Left 02/21/2014   Procedure: Left L5-S1 Hemilaminectomy, Lateral Recess Decompression, Removal Synovial Cyst;  Surgeon: Marybelle Killings, MD;  Location: [facility];  Service: Orthopedics;  Laterality: Left;   MICROLARYNGOSCOPY N/A 10/11/2018   Procedure: MICROLARYNGOSCOPY with Biopsy;  Surgeon: Izora Gala, MD;  Location: [facility];  Service: ENT;  Laterality: N/A;   polio Bilateral 1950's   surgery on feet and legs for  polio   POLIO SURGERY     TONSILLECTOMY      Social History   Socioeconomic History   Marital status: Married    Spouse name: Herbie [place]   Number of children: 1   Years of education: [employer] education level: Not on file  Occupational History   Occupation: Retired    Fish farm manager: [employer]  Tobacco Use   Smoking status: Every Day    Packs/day: 1.50    Years: 54.00    Total pack years: 81.00    Types: Cigarettes   Smokeless tobacco: Never   Tobacco comments:    currently 2 packs per day. 06/09/22 Tay  Vaping Use   Vaping Use: Never used  Substance and Sexual Activity   Alcohol use: Yes    Alcohol/week: 0.0 standard drinks of alcohol    Comment: occasional   Drug use: No   Sexual activity: Not on file  Other Topics Concern   Not on file  Social History Narrative   Patient lives at home with her spouse.   Caffeine Use: daily   Social Determinants of Health   Financial Resource Strain: Not on file  Food Insecurity: Not  on file  Transportation Needs: Not on file  Physical Activity: Not on file  Stress: Not on file  Social Connections: Not on file  Intimate Partner Violence: Not on file     Allergies  Allergen Reactions   Rosuvastatin Rash   Sulfa Antibiotics Other (See Comments)   Wellbutrin [Bupropion] Other (See Comments)    Hallucinations     Outpatient Medications Prior to Visit  Medication Sig Dispense Refill   acetaminophen-codeine (TYLENOL #3) 300-30 MG tablet Take one at night for severe pain, do not take with sleep medication 20 tablet 0   acetaminophen-codeine (TYLENOL #3) 300-30 MG tablet Take 1 tablet by mouth every 8 (eight) hours as needed for moderate pain. 30 tablet 0   aspirin 325 MG tablet Take 325 mg by mouth daily.     atorvastatin (LIPITOR) 40 MG tablet Take 40 mg by mouth daily.     bisacodyl (DULCOLAX) 10 MG suppository Place 1 suppository (10 mg total) rectally as needed for moderate constipation. 12 suppository 0   bisacodyl (DULCOLAX)  5 MG EC tablet Take 5 mg by mouth daily as needed for mild constipation or moderate constipation.     docusate sodium (COLACE) 100 MG capsule Take 100 mg by mouth daily as needed for mild constipation.     DULoxetine (CYMBALTA) 60 MG capsule Take 60 mg by mouth daily.     Homeopathic Products (LEG CRAMP RELIEF) SUBL Place 1 tablet under the tongue daily as needed (leg cramps). [employer]     hydrochlorothiazide (HYDRODIURIL) 25 MG tablet Take 25 mg by mouth daily.      omeprazole (PRILOSEC) 20 MG capsule Take 20 mg by mouth daily.     pregabalin (LYRICA) 50 MG capsule Take one at night for one week then one twice daily 40 capsule 0   zolpidem (AMBIEN) 5 MG tablet Take 5 mg by mouth at bedtime as needed for sleep.     Facility-Administered Medications Prior to Visit  Medication Dose Route Frequency Provider Last Rate Last Admin   0.9 %  sodium chloride infusion  500 mL Intravenous Once Cirigliano, Vito V, DO        Review of Systems  Constitutional:  Negative for chills, fever, malaise/fatigue and weight loss.  HENT:  Negative for hearing loss, sore throat and tinnitus.   Eyes:  Negative for blurred vision and double vision.  Respiratory:  Negative for cough, hemoptysis, sputum production, shortness of breath, wheezing and stridor.   Cardiovascular:  Negative for chest pain, palpitations, orthopnea, leg swelling and PND.  Gastrointestinal:  Negative for abdominal pain, constipation, diarrhea, heartburn, nausea and vomiting.  Genitourinary:  Negative for dysuria, hematuria and urgency.  Musculoskeletal:  Negative for joint pain and myalgias.  Skin:  Negative for itching and rash.  Neurological:  Negative for dizziness, tingling, weakness and headaches.  Endo/Heme/Allergies:  Negative for environmental allergies. Does not bruise/bleed easily.  Psychiatric/Behavioral:  Negative for depression. The patient is not nervous/anxious and does not have insomnia.   All other systems reviewed and are  negative.    Objective:  Physical Exam Vitals reviewed.  Constitutional:      General: She is not in acute distress.    Appearance: She is well-developed.  HENT:     Head: Normocephalic and atraumatic.  Eyes:     General: No scleral icterus.    Conjunctiva/sclera: Conjunctivae normal.     Pupils: Pupils are equal, round, and reactive to light.  Neck:     Vascular:  No JVD.     Trachea: No tracheal deviation.  Cardiovascular:     Rate and Rhythm: Normal rate and regular rhythm.     Heart sounds: Normal heart sounds. No murmur heard. Pulmonary:     Effort: Pulmonary effort is normal. No tachypnea, accessory muscle usage or respiratory distress.     Breath sounds: No stridor. No wheezing, rhonchi or rales.  Abdominal:     General: There is no distension.     Palpations: Abdomen is soft.     Tenderness: There is no abdominal tenderness.  Musculoskeletal:        General: No tenderness.     Cervical back: Neck supple.  Lymphadenopathy:     Cervical: No cervical adenopathy.  Skin:    General: Skin is warm and dry.     Capillary Refill: Capillary refill takes less than 2 seconds.     Findings: No rash.  Neurological:     Mental Status: She is alert and oriented to person, place, and time.  Psychiatric:        Behavior: Behavior normal.      Vitals:   06/09/22 1202  BP: 120/80  Pulse: 73  SpO2: 96%  Weight: 166 lb 12.8 oz (75.7 kg)  Height: 5\' 2"  (1.575 m)   96% on RA BMI Readings from Last 3 Encounters:  06/09/22 30.51 kg/m  05/09/22 31.09 kg/m  02/14/22 31.25 kg/m   Wt Readings from Last 3 Encounters:  06/09/22 166 lb 12.8 oz (75.7 kg)  05/09/22 170 lb (77.1 kg)  02/14/22 160 lb (72.6 kg)     CBC    Component Value Date/Time   WBC 6.7 03/24/2022 2225   RBC 4.15 03/24/2022 2225   HGB 12.5 03/24/2022 2225   HCT 37.3 03/24/2022 2225   PLT 281 03/24/2022 2225   MCV 89.9 03/24/2022 2225   MCH 30.1 03/24/2022 2225   MCHC 33.5 03/24/2022 2225   RDW  15.7 (H) 03/24/2022 2225   LYMPHSABS 0.8 03/24/2022 2225   MONOABS 0.8 03/24/2022 2225   EOSABS 0.2 03/24/2022 2225   BASOSABS 0.0 03/24/2022 2225    Chest Imaging: Nuclear medicine pet imaging 06/08/2022: Hypermetabolic area within the left upper lobe concerning for malignancy.  CT imaging dating back since 2022 and 2023 There is a confluent area within the left upper lobe that has become slightly more solid this is a lesion that corresponds to the hypermetabolic activity seen on the PET scan as above. The patient's images have been independently reviewed by me.    Pulmonary Functions Testing Results:     No data to display          FeNO:   Pathology:   Echocardiogram:   Heart Catheterization:     Assessment & Plan:     ICD-10-CM   1. Lung nodule  R91.1 Ambulatory referral to Pulmonology    Procedural/ Surgical Case Request: ROBOTIC ASSISTED NAVIGATIONAL BRONCHOSCOPY, VIDEO BRONCHOSCOPY WITH ENDOBRONCHIAL ULTRASOUND    CT Super D Chest Wo Contrast    2. Tobacco use  Z72.0     3. Abnormal PET of left lung  R94.2     4. Abnormal CT of the chest  R93.89       Discussion:  This is a 77 year old female who presents with an abnormal lung cancer screening CT and abnormal PET scan.  Over time there is been an area within the left upper lobe that has had more confluence and development to a more solid shaped lesion  over a years timeframe.  This in its conjunction with hypermetabolic uptake on the PET scan is concerning for malignancy.  Plan: Today in the office we talked about the risk benefits alternatives consideration for bronchoscopy and tissue sampling. We talked the risk of bleeding and pneumothorax. Patient is agreeable to proceed with bronchoscopy. Tentative bronchoscopy date will be on 06/28/2022. Appreciate PCC's help with scheduling.    Current Outpatient Medications:    acetaminophen-codeine (TYLENOL #3) 300-30 MG tablet, Take one at night for severe pain,  do not take with sleep medication, Disp: 20 tablet, Rfl: 0   acetaminophen-codeine (TYLENOL #3) 300-30 MG tablet, Take 1 tablet by mouth every 8 (eight) hours as needed for moderate pain., Disp: 30 tablet, Rfl: 0   aspirin 325 MG tablet, Take 325 mg by mouth daily., Disp: , Rfl:    atorvastatin (LIPITOR) 40 MG tablet, Take 40 mg by mouth daily., Disp: , Rfl:    bisacodyl (DULCOLAX) 10 MG suppository, Place 1 suppository (10 mg total) rectally as needed for moderate constipation., Disp: 12 suppository, Rfl: 0   bisacodyl (DULCOLAX) 5 MG EC tablet, Take 5 mg by mouth daily as needed for mild constipation or moderate constipation., Disp: , Rfl:    docusate sodium (COLACE) 100 MG capsule, Take 100 mg by mouth daily as needed for mild constipation., Disp: , Rfl:    DULoxetine (CYMBALTA) 60 MG capsule, Take 60 mg by mouth daily., Disp: , Rfl:    Homeopathic Products (LEG CRAMP RELIEF) SUBL, Place 1 tablet under the tongue daily as needed (leg cramps). [employer], Disp: , Rfl:    hydrochlorothiazide (HYDRODIURIL) 25 MG tablet, Take 25 mg by mouth daily. , Disp: , Rfl:    omeprazole (PRILOSEC) 20 MG capsule, Take 20 mg by mouth daily., Disp: , Rfl:    pregabalin (LYRICA) 50 MG capsule, Take one at night for one week then one twice daily, Disp: 40 capsule, Rfl: 0   zolpidem (AMBIEN) 5 MG tablet, Take 5 mg by mouth at bedtime as needed for sleep., Disp: , Rfl:   Current Facility-Administered Medications:    0.9 %  sodium chloride infusion, 500 mL, Intravenous, Once, Cirigliano, Vito V, DO  I spent 62 minutes dedicated to the care of this patient on the date of this encounter to include pre-visit review of records, face-to-face time with the patient discussing conditions above, post visit ordering of testing, clinical documentation with the electronic health record, making appropriate referrals as documented, and communicating necessary findings to members of the patients care team.    Garner Nash,  DO [facility] 06/09/2022 4:40 PM

## 2022-06-09 NOTE — Patient Instructions (Signed)
Thank you for visiting Dr. Valeta Harms at [facility]. Today we recommend the following:  Orders Placed This Encounter  Procedures   Procedural/ Surgical Case Request: ROBOTIC ASSISTED NAVIGATIONAL BRONCHOSCOPY, VIDEO BRONCHOSCOPY WITH ENDOBRONCHIAL ULTRASOUND   CT Super D Chest Wo Contrast   Ambulatory referral to Pulmonology   Bronchoscopy on 06/28/2022  Return in about 26 days (around 07/05/2022) for w/ Eric Form, NP .    Please do your part to reduce the spread of COVID-19.

## 2022-06-10 NOTE — Telephone Encounter (Signed)
PET results faxed to PCP with follow up plans included.  ?

## 2022-06-14 ENCOUNTER — Telehealth: Payer: Self-pay | Admitting: Acute Care

## 2022-06-14 NOTE — Telephone Encounter (Signed)
Pls call to advise if she needs pre-op blood work.    [phone]

## 2022-06-15 NOTE — Telephone Encounter (Signed)
Called patient and she states if she can get the letter going over the covid test, CT scan and bronch again that she lost it. Went over the covid test with her and that she does not need any blood work. She verbalized understanding. Nothing further needed

## 2022-06-24 ENCOUNTER — Ambulatory Visit (HOSPITAL_COMMUNITY)
Admission: RE | Admit: 2022-06-24 | Discharge: 2022-06-24 | Disposition: A | Discharge status: Home / Self Care | Payer: Medicare HMO | Source: Ambulatory Visit | Attending: Pulmonary Disease | Admitting: Pulmonary Disease

## 2022-06-24 ENCOUNTER — Other Ambulatory Visit: Payer: Self-pay | Admitting: *Deleted

## 2022-06-24 ENCOUNTER — Encounter (HOSPITAL_COMMUNITY): Payer: Self-pay | Admitting: Pulmonary Disease

## 2022-06-24 DIAGNOSIS — R911 Solitary pulmonary nodule: Secondary | ICD-10-CM | POA: Diagnosis present

## 2022-06-24 DIAGNOSIS — Z01818 Encounter for other preprocedural examination: Secondary | ICD-10-CM

## 2022-06-24 NOTE — Progress Notes (Addendum)
PCP - Dr Bernerd Limbo Cardiologist - Dr Marina Goodell  CT Chest x-ray - 06/24/22 EKG - 03/24/22 Stress Test - n/a ECHO - n/a Cardiac Cath - n/a  ICD Pacemaker/Loop - n/a  Sleep Study -  Yes 05/20/16  CPAP - none  Diabetes- n/a  NPO  Anesthesia review: Yes  STOP now taking any Aspirin (unless otherwise instructed by your surgeon), Aleve, Naproxen, Ibuprofen, Motrin, Advil, Goody's, BC's, all herbal medications, fish oil, and all vitamins.   Coronavirus Screening Covid Test on  Do you have any of the following symptoms:  Cough yes/no: Yes, occasionally Fever (>100.53F)  yes/no: No Runny nose yes/no: No Sore throat yes/no: No Difficulty breathing/shortness of breath  yes/no: Yes  Have you traveled in the last 14 days and where? yes/no: No  Patient verbalized understanding of instructions that were given via phone.

## 2022-06-26 LAB — NOVEL CORONAVIRUS, NAA: SARS-CoV-2, NAA: NOT DETECTED

## 2022-06-26 LAB — SPECIMEN STATUS REPORT

## 2022-06-27 NOTE — Progress Notes (Signed)
Anesthesia Chart Review: SAME DAY WORK-UP  Case: [id number] Date/Time: 06/28/22 1045   Procedures:      ROBOTIC ASSISTED NAVIGATIONAL BRONCHOSCOPY (Bilateral) - ION w/ CIOS     VIDEO BRONCHOSCOPY WITH ENDOBRONCHIAL ULTRASOUND (Bilateral)   Anesthesia type: General   Diagnosis: Lung nodule [R91.1]   Pre-op diagnosis: lung nodule   Location: [facility] / [facility]   Surgeons: Garner Nash, DO       DISCUSSION: Patient is a 77 year old female scheduled for the above procedure. 05/09/22 CT Chest LCS showed increased size of LUL irregular nodular opacity and new endobronchial nodule RML likely due to mucous plugging. 06/08/22 PET scan showed a hypermetabolic LUL nodule concerning for bronchogenic carcinoma. The above procedure was recommended for tissue diagnosis.   History includes smoking (81 pack years), HTN, MVP, polio (1948, residual leg weakness), GERD, anxiety, COPD, CKD (stage 3a), pre-diabetes, dyspnea, atherosclerosis (aortic, coronary atherosclerosis CT imaging 04/2021), spinal surgery (left L4-5 microdiscectomy 04/13/09; L5-S1 microdiscectomy 02/21/14), vocal cord polyps (s/p microlaryngoscopy, biopsy negative for malignancy 10/11/18).  Cardiologist is Dr. Mervin Hack, last visit 12/21/21. Her initial visit was on 07/26/21 with APP. She had left arm pain, but very atypical for cardiac etiology--lasted for 1-2 seconds and with no chest pain. 1/6 SEM heard. Given smoking, HTN, HLD, and coronary calcifications noted on CT imaging, a CT coronary calcium score was ordered and if elevated would consider other ischemic testings. Echo also ordered to "evaluate heart function and structure." 08/16/21 Coronary Ca Score was 0. At 12/21/21, no recent arm pain but with chronic DOE and fatigue. Additional recommendation pending echo results, but she did not schedule yet.   Recent imaging concerning for lung cancer, but tissue diagnosis needed to confirm. 1/6 SEM per cardiology visit last year.  She did not schedule an echo. Coronary calcium score was 0 08/16/21. She is a same day work-up. Anesthesia team to evaluate on the day of surgery. Reviewed with anesthesiologist Hoy Morn, MD.    VS: Ht 5\' 2"  (1.575 m)   Wt 75.7 kg   BMI 30.51 kg/m  BP Readings from Last 3 Encounters:  06/09/22 120/80  03/24/22 (!) 147/79  02/14/22 136/84   Pulse Readings from Last 3 Encounters:  06/09/22 73  03/24/22 (!) 109  02/14/22 87     PROVIDERS: Bernerd Limbo, MD is PCP  Leodis Rains, MD is cardiologist [facility]) Candy Sledge, DO is nephrologist ([facility])   LABS: For day of surgery as indicated. As of 03/24/22, Cr 1.07, AST 25, ALT 23, H/H 12.5/37.3, PLT 281. A1c 5.9% 03/22/22 ([facility])   IMAGES: CT Super D Chest 06/24/22: IMPRESSION: 1. The sub solid lesion of concern peripherally in the left upper lobe has a more cephalad component measuring about 2.1 by 01.0 cm and a more caudad component measuring about 1.3 by 0.9 cm. Appearance is suspicious for low-grade adenocarcinoma particularly in light of the accentuated metabolic activity. 2. Interval clearing of the right middle lobe endobronchial filling defect, compatible with previous mucous plugging. 3. Aberrant right subclavian artery passes behind the esophagus. 4. Old granulomatous disease. 5. Aortic atherosclerosis.  PET Scan 06/08/22: IMPRESSION: 1. Hypermetabolic ground-glass nodule in the LEFT upper lobe is concerning for bronchogenic carcinoma. 2. No evidence of mediastinal nodal metastasis or distant metastatic disease. Some motion degradation in the neck. 3. No evidence skeletal metastasis. 4.  Aortic Atherosclerosis (ICD10-I70.0).   CT Head 10/17/21 ([facility]): IMPRESSION:  Normal   MRI Brain 10/09/21: IMPRESSION: No acute infarction, hemorrhage, or  mass. Mild chronic microvascular ischemic changes.    EKG: 03/24/22: Sinus tachycardia at 111 bpm Abnormal R-wave progression, early  transition Confirmed by Veryl Speak [phone]) on 03/26/2022 2:02:24 AM   CV: CT Cardiac Calcium Scoring 08/16/21 ([facility]): FINDINGS: CALCIUM SCORE: 0.   LM: 0.  LAD: 0.  Cx: 0.  RCA: 0  PDA: 0   Cardiac anatomy:  Normal heart size, no evidence of pericardial effusion  Mediastinum: No adenopathy  Visible abdomen: No significant abnormality  Visible lung fields: Bilateral interstitial thickening and early peripheral reticulation. Large granuloma in the right middle lobe.   IMPRESSION: No detectable calcified coronary artery plaque.    Past Medical History:  Diagnosis Date   Anxiety    Atherosclerosis    Bronchitis due to tobacco use    quit smoking 3 months ago   Chronic kidney disease    stage 3a   Chronic sciatica of left side 04/01/2015   COPD (chronic obstructive pulmonary disease) (HCC)    Depression    Dyspnea    occasional   GERD (gastroesophageal reflux disease)    no current problems and no meds   Hypertension    Mitral valve prolapse    years ago   Pneumonia    years ago   22    age 29 years old   Pre-diabetes    no meds    Past Surgical History:  Procedure Laterality Date   ABDOMINAL HYSTERECTOMY  1985   BACK SURGERY     COLONOSCOPY  2017   EYE SURGERY     cataracts removed bilateral   LUMBAR LAMINECTOMY Left 02/21/2014   Procedure: Left L5-S1 Hemilaminectomy, Lateral Recess Decompression, Removal Synovial Cyst;  Surgeon: Marybelle Killings, MD;  Location: [facility];  Service: Orthopedics;  Laterality: Left;   MICROLARYNGOSCOPY N/A 10/11/2018   Procedure: MICROLARYNGOSCOPY with Biopsy;  Surgeon: Izora Gala, MD;  Location: [facility] OR;  Service: ENT;  Laterality: N/A;   polio Bilateral 1950's   surgery on feet and legs for polio   TONSILLECTOMY      MEDICATIONS:  0.9 %  sodium chloride infusion    BIOTIN PO   diazepam (VALIUM) 5 MG tablet   Magnesium Bisglycinate (MAG GLYCINATE) 100 MG TABS   acetaminophen-codeine (TYLENOL #3) 300-30 MG tablet    acetaminophen-codeine (TYLENOL #3) 300-30 MG tablet   aspirin 325 MG tablet   atorvastatin (LIPITOR) 40 MG tablet   bisacodyl (DULCOLAX) 10 MG suppository   bisacodyl (DULCOLAX) 5 MG EC tablet   docusate sodium (COLACE) 100 MG capsule   DULoxetine (CYMBALTA) 60 MG capsule   Homeopathic Products (LEG CRAMP RELIEF) SUBL   hydrochlorothiazide (HYDRODIURIL) 25 MG tablet   omeprazole (PRILOSEC) 20 MG capsule   pregabalin (LYRICA) 50 MG capsule   zolpidem (AMBIEN) 5 MG tablet    Myra Gianotti, PA-C Surgical Short Stay/Anesthesiology [facility] Phone [phone] [facility] Phone [phone] 06/27/2022 3:00 PM

## 2022-06-27 NOTE — Anesthesia Preprocedure Evaluation (Signed)
Anesthesia Evaluation  Patient identified by MRN, date of birth, ID band Patient awake    Reviewed: Allergy & Precautions, NPO status , Patient's Chart, lab work & pertinent test results  Airway Mallampati: II  TM Distance: >3 FB Neck ROM: Full    Dental   Pulmonary shortness of breath, COPD, Current Smoker and Patient abstained from smoking.   breath sounds clear to auscultation       Cardiovascular hypertension, Pt. on medications  Rhythm:Regular Rate:Normal  CT Coronary Ca Scoring 08/16/21: MPRESSION: No detectable calcified coronary artery plaque.    Neuro/Psych  Neuromuscular disease    GI/Hepatic Neg liver ROS,GERD  ,,  Endo/Other  negative endocrine ROS    Renal/GU Renal disease     Musculoskeletal   Abdominal   Peds  Hematology negative hematology ROS (+)   Anesthesia Other Findings   Reproductive/Obstetrics                             Anesthesia Physical Anesthesia Plan  ASA: 3  Anesthesia Plan: General   Post-op Pain Management: Tylenol PO (pre-op)*   Induction: Intravenous  PONV Risk Score and Plan: 2 and Dexamethasone and Ondansetron  Airway Management Planned: Oral ETT  Additional Equipment: None  Intra-op Plan:   Post-operative Plan: Extubation in OR  Informed Consent: I have reviewed the patients History and Physical, chart, labs and discussed the procedure including the risks, benefits and alternatives for the proposed anesthesia with the patient or authorized representative who has indicated his/her understanding and acceptance.     Dental advisory given  Plan Discussed with: CRNA  Anesthesia Plan Comments: (PAT note written 06/27/2022 by Myra Gianotti, PA-C.  )       Anesthesia Quick Evaluation

## 2022-06-27 NOTE — Progress Notes (Signed)
PCP - Dr David Bouska Cardiologist - Dr Gocha Saliashvili  CT Chest x-ray - 06/24/22 EKG - 03/24/22 Stress Test - n/a ECHO - n/a Cardiac Cath - n/a  ICD Pacemaker/Loop - n/a  Sleep Study -  Yes 05/20/16  CPAP - none  Diabetes- n/a  NPO  Anesthesia review: Yes  STOP now taking any Aspirin (unless otherwise instructed by your surgeon), Aleve, Naproxen, Ibuprofen, Motrin, Advil, Goody's, BC's, all herbal medications, fish oil, and all vitamins.   Coronavirus Screening Covid Test on  Do you have any of the following symptoms:  Cough yes/no: Yes, occasionally Fever (>100.4F)  yes/no: No Runny nose yes/no: No Sore throat yes/no: No Difficulty breathing/shortness of breath  yes/no: Yes  Have you traveled in the last 14 days and where? yes/no: No  Patient verbalized understanding of instructions that were given via phone. 

## 2022-06-28 ENCOUNTER — Other Ambulatory Visit: Payer: Self-pay

## 2022-06-28 ENCOUNTER — Ambulatory Visit (HOSPITAL_COMMUNITY): Payer: Medicare HMO

## 2022-06-28 ENCOUNTER — Encounter (HOSPITAL_COMMUNITY)
Admission: RE | Disposition: A | Discharge status: Home / Self Care | Payer: Self-pay | Source: Home / Self Care | Attending: Pulmonary Disease

## 2022-06-28 ENCOUNTER — Ambulatory Visit (HOSPITAL_BASED_OUTPATIENT_CLINIC_OR_DEPARTMENT_OTHER): Payer: Medicare HMO | Admitting: Vascular Surgery

## 2022-06-28 ENCOUNTER — Encounter (HOSPITAL_COMMUNITY): Payer: Self-pay | Admitting: Pulmonary Disease

## 2022-06-28 ENCOUNTER — Ambulatory Visit (HOSPITAL_COMMUNITY): Payer: Medicare HMO | Admitting: Vascular Surgery

## 2022-06-28 ENCOUNTER — Ambulatory Visit (HOSPITAL_COMMUNITY)
Admission: RE | Admit: 2022-06-28 | Discharge: 2022-06-28 | Disposition: A | Discharge status: Home / Self Care | Payer: Medicare HMO | Attending: Pulmonary Disease | Admitting: Pulmonary Disease

## 2022-06-28 DIAGNOSIS — C3412 Malignant neoplasm of upper lobe, left bronchus or lung: Secondary | ICD-10-CM | POA: Diagnosis present

## 2022-06-28 DIAGNOSIS — K219 Gastro-esophageal reflux disease without esophagitis: Secondary | ICD-10-CM | POA: Diagnosis not present

## 2022-06-28 DIAGNOSIS — J449 Chronic obstructive pulmonary disease, unspecified: Secondary | ICD-10-CM

## 2022-06-28 DIAGNOSIS — R0602 Shortness of breath: Secondary | ICD-10-CM | POA: Insufficient documentation

## 2022-06-28 DIAGNOSIS — R911 Solitary pulmonary nodule: Secondary | ICD-10-CM | POA: Diagnosis present

## 2022-06-28 DIAGNOSIS — I10 Essential (primary) hypertension: Secondary | ICD-10-CM

## 2022-06-28 DIAGNOSIS — F17210 Nicotine dependence, cigarettes, uncomplicated: Secondary | ICD-10-CM | POA: Insufficient documentation

## 2022-06-28 DIAGNOSIS — N1831 Chronic kidney disease, stage 3a: Secondary | ICD-10-CM | POA: Insufficient documentation

## 2022-06-28 DIAGNOSIS — I129 Hypertensive chronic kidney disease with stage 1 through stage 4 chronic kidney disease, or unspecified chronic kidney disease: Secondary | ICD-10-CM | POA: Insufficient documentation

## 2022-06-28 DIAGNOSIS — I700 Atherosclerosis of aorta: Secondary | ICD-10-CM | POA: Diagnosis not present

## 2022-06-28 HISTORY — DX: Chronic kidney disease, unspecified: N18.9

## 2022-06-28 HISTORY — DX: Dyspnea, unspecified: R06.00

## 2022-06-28 HISTORY — PX: BRONCHIAL BRUSHINGS: SHX5108

## 2022-06-28 HISTORY — DX: Prediabetes: R73.03

## 2022-06-28 HISTORY — DX: Unspecified atherosclerosis: I70.90

## 2022-06-28 HISTORY — PX: BRONCHIAL BIOPSY: SHX5109

## 2022-06-28 LAB — BASIC METABOLIC PANEL
Anion gap: 11 (ref 5–15)
BUN: 12 mg/dL (ref 8–23)
CO2: 25 mmol/L (ref 22–32)
Calcium: 9.7 mg/dL (ref 8.9–10.3)
Chloride: 104 mmol/L (ref 98–111)
Creatinine, Ser: 1.06 mg/dL — ABNORMAL HIGH (ref 0.44–1.00)
GFR, Estimated: 54 mL/min — ABNORMAL LOW (ref >60)
Glucose, Bld: 97 mg/dL (ref 70–99)
Potassium: 3 mmol/L — ABNORMAL LOW (ref 3.5–5.1)
Sodium: 140 mmol/L (ref 135–145)

## 2022-06-28 LAB — CBC
HCT: 38 % (ref 36.0–46.0)
Hemoglobin: 12.9 g/dL (ref 12.0–15.0)
MCH: 30.4 pg (ref 26.0–34.0)
MCHC: 33.9 g/dL (ref 30.0–36.0)
MCV: 89.6 fL (ref 80.0–100.0)
Platelets: 267 10*3/uL (ref 150–400)
RBC: 4.24 MIL/uL (ref 3.87–5.11)
RDW: 14.8 % (ref 11.5–15.5)
WBC: 6.1 10*3/uL (ref 4.0–10.5)
nRBC: 0 % (ref 0.0–0.2)

## 2022-06-28 SURGERY — BRONCHOSCOPY, WITH BIOPSY USING ELECTROMAGNETIC NAVIGATION
Anesthesia: General | Laterality: Bilateral

## 2022-06-28 MED ORDER — ALBUMIN HUMAN 5 % IV SOLN
12.5000 g | Freq: Once | INTRAVENOUS | Status: AC
  Administered 2022-06-28: 12.5 g via INTRAVENOUS

## 2022-06-28 MED ORDER — CHLORHEXIDINE GLUCONATE 0.12 % MT SOLN
OROMUCOSAL | Status: AC
  Administered 2022-06-28: 15 mL
  Filled 2022-06-28: qty 15

## 2022-06-28 MED ORDER — FENTANYL CITRATE (PF) 250 MCG/5ML IJ SOLN
INTRAMUSCULAR | Status: DC | PRN
  Administered 2022-06-28: 100 ug via INTRAVENOUS

## 2022-06-28 MED ORDER — AMISULPRIDE (ANTIEMETIC) 5 MG/2ML IV SOLN: 10.0000 mg | Freq: Once | INTRAVENOUS | Status: DC | PRN

## 2022-06-28 MED ORDER — SUGAMMADEX SODIUM 200 MG/2ML IV SOLN
INTRAVENOUS | Status: DC | PRN
  Administered 2022-06-28: 200 mg via INTRAVENOUS

## 2022-06-28 MED ORDER — MIDAZOLAM HCL 2 MG/2ML IJ SOLN
INTRAMUSCULAR | Status: DC | PRN
  Administered 2022-06-28: 1 mg via INTRAVENOUS

## 2022-06-28 MED ORDER — ONDANSETRON HCL 4 MG/2ML IJ SOLN
INTRAMUSCULAR | Status: DC | PRN
  Administered 2022-06-28: 4 mg via INTRAVENOUS

## 2022-06-28 MED ORDER — ACETAMINOPHEN 500 MG PO TABS
1000.0000 mg | ORAL_TABLET | Freq: Once | ORAL | Status: AC
  Administered 2022-06-28: 1000 mg via ORAL
  Filled 2022-06-28: qty 2

## 2022-06-28 MED ORDER — LACTATED RINGERS IV SOLN: INTRAVENOUS | Status: DC

## 2022-06-28 MED ORDER — FENTANYL CITRATE (PF) 100 MCG/2ML IJ SOLN: 25.0000 ug | INTRAMUSCULAR | Status: DC | PRN

## 2022-06-28 MED ORDER — PHENYLEPHRINE HCL-NACL 20-0.9 MG/250ML-% IV SOLN
INTRAVENOUS | Status: DC | PRN
  Administered 2022-06-28: 25 ug/min via INTRAVENOUS

## 2022-06-28 MED ORDER — LIDOCAINE 2% (20 MG/ML) 5 ML SYRINGE
INTRAMUSCULAR | Status: DC | PRN
  Administered 2022-06-28: 80 mg via INTRAVENOUS

## 2022-06-28 MED ORDER — ALBUMIN HUMAN 5 % IV SOLN
INTRAVENOUS | Status: AC
  Filled 2022-06-28: qty 250

## 2022-06-28 MED ORDER — PHENYLEPHRINE 80 MCG/ML (10ML) SYRINGE FOR IV PUSH (FOR BLOOD PRESSURE SUPPORT)
PREFILLED_SYRINGE | INTRAVENOUS | Status: DC | PRN
  Administered 2022-06-28: 160 ug via INTRAVENOUS

## 2022-06-28 MED ORDER — CHLORHEXIDINE GLUCONATE 0.12 % MT SOLN: 15.0000 mL | Freq: Once | OROMUCOSAL | Status: DC

## 2022-06-28 MED ORDER — PROPOFOL 10 MG/ML IV BOLUS
INTRAVENOUS | Status: DC | PRN
  Administered 2022-06-28: 150 mg via INTRAVENOUS
  Administered 2022-06-28: 120 ug/kg/min via INTRAVENOUS

## 2022-06-28 MED ORDER — ROCURONIUM BROMIDE 10 MG/ML (PF) SYRINGE
PREFILLED_SYRINGE | INTRAVENOUS | Status: DC | PRN
  Administered 2022-06-28: 60 mg via INTRAVENOUS
  Administered 2022-06-28: 10 mg via INTRAVENOUS

## 2022-06-28 SURGICAL SUPPLY — 29 items

## 2022-06-28 NOTE — Transfer of Care (Signed)
Immediate Anesthesia Transfer of Care Note  Patient: [name]  Procedure(s) Performed: ROBOTIC ASSISTED NAVIGATIONAL BRONCHOSCOPY (Bilateral) BRONCHIAL BIOPSIES BRONCHIAL BRUSHINGS  Patient Location: PACU  Anesthesia Type:General  Level of Consciousness: drowsy and patient cooperative  Airway & Oxygen Therapy: Patient connected to face mask oxygen  Post-op Assessment: Report given to RN and Post -op Vital signs reviewed and stable  Post vital signs: Reviewed and stable  Last Vitals:  Vitals Value Taken Time  BP    Temp    Pulse 62 06/28/22 1053  Resp 14 06/28/22 1053  SpO2 100 % 06/28/22 1053  Vitals shown include unvalidated device data.  Last Pain:  Vitals:   06/28/22 0908  TempSrc:   PainSc: 0-No pain         Complications: There were no known notable events for this encounter.

## 2022-06-28 NOTE — Discharge Instructions (Signed)

## 2022-06-28 NOTE — Op Note (Signed)
Video Bronchoscopy with Robotic Assisted Bronchoscopic Navigation   Date of Operation: 06/28/2022   Pre-op Diagnosis: Lung nodule   Post-op Diagnosis: Lung nodule   Surgeon: Garner Nash, DO   Assistants: None   Anesthesia: General endotracheal anesthesia  Operation: Flexible video fiberoptic bronchoscopy with robotic assistance and biopsies.  Estimated Blood Loss: Minimal  Complications: None  Indications and History: [name] is a 77 y.o. female with history of lung nodule, LUL. The risks, benefits, complications, treatment options and expected outcomes were discussed with the patient.  The possibilities of pneumothorax, pneumonia, reaction to medication, pulmonary aspiration, perforation of a viscus, bleeding, failure to diagnose a condition and creating a complication requiring transfusion or operation were discussed with the patient who freely signed the consent.    Description of Procedure: The patient was seen in the Preoperative Area, was examined and was deemed appropriate to proceed.  The patient was taken to [facility] endoscopy room 3, identified as [name] and the procedure verified as Flexible Video Fiberoptic Bronchoscopy.  A Time Out was held and the above information confirmed.   Prior to the date of the procedure a high-resolution CT scan of the chest was performed. Utilizing ION software program a virtual tracheobronchial tree was generated to allow the creation of distinct navigation pathways to the patient's parenchymal abnormalities. After being taken to the operating room general anesthesia was initiated and the patient  was orally intubated. The video fiberoptic bronchoscope was introduced via the endotracheal tube and a general inspection was performed which showed normal right and left lung anatomy, aspiration of the bilateral mainstems was completed to remove any remaining secretions. Robotic catheter inserted into patient's endotracheal tube.   Target #1  LUL nodule: The distinct navigation pathways prepared prior to this procedure were then utilized to navigate to patient's lesion identified on CT scan. The robotic catheter was secured into place and the vision probe was withdrawn.  Lesion location was approximated using fluoroscopy and 3D CBCT for peripheral targeting. Under fluoroscopic guidance transbronchial needle brushings and transbronchial forceps biopsies were performed to be sent for cytology and pathology.  At the end of the procedure a general airway inspection was performed and there was no evidence of active bleeding. The bronchoscope was removed.  The patient tolerated the procedure well. There was no significant blood loss and there were no obvious complications. A post-procedural chest x-ray is pending.  Samples Target #1: 1. Transbronchial needle brushings from LUL 2. Transbronchial forceps biopsies from LUL  Plans:  The patient will be discharged from the PACU to home when recovered from anesthesia and after chest x-ray is reviewed. We will review the cytology, pathology results with the patient when they become available. Outpatient followup will be with LUL.    [facility] 06/28/2022 10:44 AM

## 2022-06-28 NOTE — Research (Signed)
Title: A multi-center, prospective, single-arm, observational study to evaluate real-world outcomes for the shape-sensing Ion endoluminal system  Primary Outcome: Evaluate procedure characteristics and short and long-term patient outcomes following shape-sensing robotic-assisted bronchoscopy (ssRAB) utilizing the Ion Endoluminal System for lung lesion localization or biopsy.   Protocol # / Study Name: ISI-ION-003 Clinical Trials #: [id number] Sponsor: [place] Investigator: Dr. Leory Plowman Icard  Key Features of Ion Endoluminal System (referred to as "Ion") Ion is the first FDA cleared bronchoscopy system that uses fiber optic shape sensing technology to inform on location within the airways. Its catheter/tool channel has a smaller outer diameter (3.5 mm) in comparison to conventional bronchoscopes, allowing it to navigate into the smaller airways of the periphery.     Key Inclusion Criteria Subject is 18 years or older at the time of the procedure Subject is a candidate for a planned, elective RAB lung lesion localization or biopsy procedure in which the Ion Endoluminal System is planned to be utilized.  Subject  able to understand and adhere to study requirements and provide informed consent.   Key Exclusion Criteria Subject is under the care of a Herbalist and is unable to provide informed consent on their own accord.  Subject is participating in an interventional research study or research study with investigational agents with an unknown safety profile that would interfere with participation or the results of this study.  Female subjects who are pregnant or nursing at the time of the index Ion procedure, as determined by standard site practices. Subjects that are incarcerated or institutionalized under court order, or other vulnerable populations.    Previous Clinical Trials Since receiving FDA clearance in Feb 2019, Ion has been adopted  commercially by over 226 centers in the [place], and utilized in over 40,000 procedures.  The first in-human study enrolled 40 subjects with a mean lesion size of 14.8 mm and the overall diagnostic yield was 79.3%, with no adverse events. 17 (58.6%) lesions were reported to have a bronchus sign available on CT imaging.  A multi-center study published results in 2022, with 270 lesions biopsied in 241 patients using Ion. The mean largest cardinal lesion size was 18.86.60mm, and the mean airway generation count was 7.01.6. Asymptomatic pneumothorax occurred in 3.3% of subjects, and 0.8% experienced airway bleeding.   Another study provided preliminary results in 2022, with 87% sensitivity for malignancy, a diagnostic yield of 81%, and a mean lesion size of 16 mm. 75% of biopsy cases were bronchus-sign negative. 4% of subjects experienced pneumothoraces (including those requiring intervention), and 0.8% of subjects experienced airway bleeding requiring wedging or balloon tamponade.  A single-center study captured 131 consecutive procedures of pulmonary biopsy using Ion. The navigational success rate was 98.7%, with an overall diagnostic yield of 81.7%, an overall complication rate of 3%, and a pneumothorax rate of 1.5%.    [facility] [place] Coordinator note:   This visit for Subject [name] with DOB: 1946-03-04 on 06/28/2022 for the above protocol is Visit/Encounter # Pre-Procedure, Intra-Procedure and Post-Procedure  and is for purpose of research.   The consent for this encounter is under:  Protocol Version 1.0 Investigator Brochure Version N/A Consent Version Revision A, dated [id number] and is currently IRB approved.   [name] expressed continued interest and consent in continuing as a study subject. Subject confirmed that there was no change in contact information (e.g. address, telephone, email). Subject thanked for participation in research and contribution to  science.  In this visit 06/28/2022 the subject will be evaluated by Principal Investigator named Dr. Valeta Harms. This research coordinator has verified that the above investigator is up to date with his/her training logs.   The Subject was informed that the PI continues to have oversight of the subject's visits and course through relevant discussions, reviews, and also specifically of this visit by routing of this note to the PI.  The research study was discussed with the subject in the pre-operative room. The study was explained in detail including all the contents of the informed consent document. The subject was encouraged to ask questions. All questions were answered to their satisfaction. The IRB approved informed consent was signed, and a copy was given to the subject. After obtaining consent, the subject underwent scheduled procedure using the ion endoluminal system. Data collection was completed per protocol. Refer to paper source subject binder for further details.      Signed by  Jose Persia Clinical Research Coordinator / Sub-Investigator  [facility]  [place], [place] 2:59 PM 06/28/2022

## 2022-06-28 NOTE — Interval H&P Note (Signed)
History and Physical Interval Note:  06/28/2022 9:30 AM  [name]  has presented today for surgery, with the diagnosis of lung nodule.  The various methods of treatment have been discussed with the patient and family. After consideration of risks, benefits and other options for treatment, the patient has consented to  Procedure(s) with comments: ROBOTIC ASSISTED NAVIGATIONAL BRONCHOSCOPY (Bilateral) - ION w/ CIOS VIDEO BRONCHOSCOPY WITH ENDOBRONCHIAL ULTRASOUND (Bilateral) as a surgical intervention.  The patient's history has been reviewed, patient examined, no change in status, stable for surgery.  I have reviewed the patient's chart and labs.  Questions were answered to the patient's satisfaction.     [place]

## 2022-06-28 NOTE — Anesthesia Postprocedure Evaluation (Signed)
Anesthesia Post Note  Patient: [name]  Procedure(s) Performed: ROBOTIC ASSISTED NAVIGATIONAL BRONCHOSCOPY (Bilateral) BRONCHIAL BIOPSIES BRONCHIAL BRUSHINGS     Patient location during evaluation: PACU Anesthesia Type: General Level of consciousness: awake and alert Pain management: pain level controlled Vital Signs Assessment: post-procedure vital signs reviewed and stable Respiratory status: spontaneous breathing, nonlabored ventilation, respiratory function stable and patient connected to nasal cannula oxygen Cardiovascular status: blood pressure returned to baseline and stable Postop Assessment: no apparent nausea or vomiting Anesthetic complications: no  There were no known notable events for this encounter.  Last Vitals:  Vitals:   06/28/22 1200 06/28/22 1215  BP: 122/64 121/67  Pulse: 72 79  Resp: 17 13  Temp:  36.6 C  SpO2: 100% 94%    Last Pain:  Vitals:   06/28/22 1052  TempSrc:   PainSc: Asleep                 Tiajuana Amass

## 2022-06-28 NOTE — Anesthesia Procedure Notes (Signed)
Procedure Name: Intubation Date/Time: 06/28/2022 9:58 AM  Performed by: Ester Rink, CRNAPre-anesthesia Checklist: Patient identified, Emergency Drugs available, Suction available and Patient being monitored Patient Re-evaluated:Patient Re-evaluated prior to induction Oxygen Delivery Method: Circle system utilized Preoxygenation: Pre-oxygenation with 100% oxygen Induction Type: IV induction Ventilation: Mask ventilation without difficulty Laryngoscope Size: Mac and 4 Grade View: Grade I Tube type: Oral Tube size: 8.5 mm Number of attempts: 1 Airway Equipment and Method: Stylet and Oral airway Placement Confirmation: ETT inserted through vocal cords under direct vision, positive ETCO2 and breath sounds checked- equal and bilateral Secured at: 22 cm Tube secured with: Tape Dental Injury: Teeth and Oropharynx as per pre-operative assessment

## 2022-06-30 ENCOUNTER — Telehealth: Payer: Self-pay | Admitting: Acute Care

## 2022-06-30 LAB — CYTOLOGY - NON PAP

## 2022-06-30 NOTE — Telephone Encounter (Signed)
P2P has been added to the schedule for this pt   Velta Addison stated she also has a few more patients to ask about for Judson Roch, I wil document the info in a staff message for her to reference tomorrow    I was not provided a phone number so Velta Addison will be calling me directly and I will have to transfer the call out once Judson Roch is ready to provide me with the proper phone number.

## 2022-06-30 NOTE — Telephone Encounter (Signed)
I spoke to [employer] from [place] [phone] with reference number [id number] And she wanted to speak to the provider about dx and images being ordered by providers and to get prior auth's from the provider so that there is not a delay in pt's care. I informed her that the providers do not do the prior auth's and will send this message to the right deparment. She verbalized understanding. I'll send to the PCC's Nothing further needed,

## 2022-07-01 ENCOUNTER — Encounter: Payer: Self-pay | Admitting: Acute Care

## 2022-07-01 ENCOUNTER — Ambulatory Visit (INDEPENDENT_AMBULATORY_CARE_PROVIDER_SITE_OTHER): Payer: Medicare HMO | Admitting: Acute Care

## 2022-07-01 DIAGNOSIS — R9389 Abnormal findings on diagnostic imaging of other specified body structures: Secondary | ICD-10-CM

## 2022-07-01 NOTE — Progress Notes (Signed)
Virtual Visit via Telephone Note  I connected with [name] on 07/01/22 at  2:00 PM EST by telephone and verified that I am speaking with the correct person using two identifiers.  Location: Patient:  At Home>> This is a Peer to Peer with insurance company for approval of  Provider: [place], [place], [place], [address]    I discussed the limitations, risks, security and privacy concerns of performing an evaluation and management service by telephone and the availability of in person appointments. I also discussed with the patient that there may be a patient responsible charge related to this service. The patient expressed understanding and agreed to proceed.   History of Present Illness: Pt. Is followed by Dr. Valeta Harms and the lung cancer screening program for abnormal lung cancer screening CT and abnormal PET scan. She is a current every day smoker with a 54 pack year smoking history.Over time there is been an area within the left upper lobe that has had more confluence and development to a more solid shaped lesion over a years timeframe. This in its conjunction with hypermetabolic uptake on the PET scan is concerning for malignancy. She underwent a bronchoscopy and tissue sampling on 06/28/2022. Results of biopsy showed no malignant cells from the left upper lobe of the lung brushing and FNB. She will have a 3 month follow up CT Chest to confirm stability of the nodules of concern.   This is a peer to peer from the patient's insurance carrier with [facility]. I explained is a current every day smoker  that the patient had an abnormal lung cancer screening scan. She then had a PET scan that confirmed hypermetabolism of the nodules of concern. She underwent  Flexible video fiberoptic bronchoscopy with robotic assistance and biopsies. The biopsies did not show malignant cells.She will have a follow up appointment in the office 07/05/2022  All insurance questions were answered     Observations/Objective: FINAL MICROSCOPIC DIAGNOSIS:   A. LUNG, LUL, BRUSH:  - No malignant cells identified   B. LUNG, LUL, FINE NEEDLE ASPIRATION:  - No malignant cells identified    Assessment and Plan: Pt. Will have a 3 month follow up CT Chest to re-evaluate the nodule of concern.   Follow Up Instructions: Pt. Has appointment for 07/05/2022 to discuss the results   I discussed the assessment and treatment plan with the patient. The patient was provided an opportunity to ask questions and all were answered. The patient agreed with the plan and demonstrated an understanding of the instructions.   The patient was advised to call back or seek an in-person evaluation if the symptoms worsen or if the condition fails to improve as anticipated.  I provided 30 minutes of non-face-to-face time during this encounter.   Magdalen Spatz, NP  07/01/2022

## 2022-07-01 NOTE — Patient Instructions (Signed)
This was a peer to peer with insurance company. Patient was not present

## 2022-07-02 ENCOUNTER — Encounter (HOSPITAL_COMMUNITY): Payer: Self-pay | Admitting: Pulmonary Disease

## 2022-07-05 ENCOUNTER — Ambulatory Visit: Payer: Medicare HMO | Admitting: Acute Care

## 2022-07-05 ENCOUNTER — Encounter: Payer: Self-pay | Admitting: Acute Care

## 2022-07-05 VITALS — BP 140/84 | HR 74 | Temp 97.8°F | Ht 62.0 in | Wt 170.2 lb

## 2022-07-05 DIAGNOSIS — F17210 Nicotine dependence, cigarettes, uncomplicated: Secondary | ICD-10-CM

## 2022-07-05 DIAGNOSIS — Z720 Tobacco use: Secondary | ICD-10-CM

## 2022-07-05 DIAGNOSIS — R911 Solitary pulmonary nodule: Secondary | ICD-10-CM | POA: Diagnosis not present

## 2022-07-05 DIAGNOSIS — I10 Essential (primary) hypertension: Secondary | ICD-10-CM | POA: Diagnosis not present

## 2022-07-05 NOTE — Progress Notes (Signed)
History of Present Illness [name] is a 77 y.o. female current every day smoker with abnormal finding on Lung Cancer Screening Scan 05/11/2022.PET scan was concerning for  Hypermetabolic ground-glass nodule in the LEFT upper lobe is concerning for bronchogenic carcinoma.She underwent a Flexible video fiberoptic bronchoscopy with robotic assistance and biopsies on 06/28/2022. She is followed by Dr. Valeta Harms.    07/05/2022 Pt. Presents for follow up after Flexible video fiberoptic bronchoscopy with robotic assistance and biopsies on 06/28/2022. Pt. Did well post procedure. No bleeding and no sore throat. She states she is at her baseline.   Pt. Is here with her daughter today. We discussed the results of her biopsies.  I explained that both the fine-needle aspiration and brushing of the left upper lobe were negative for malignant cells.  I explained that we still have concerns about that nodule and will need to continue to follow-up with imaging.  I explained that the first scan will be done in 3 months to reevaluate the nodule and ensure that it is not continuing to grow.  Patient is relieved, and both she and her daughter are in agreement with a short-term 38-monthfollow-up to reevaluate the nodule. I did discuss with the patient it is in her best interest to quit smoking.  She verbalized understanding. Patient denies any hemoptysis or weight loss.  Test Results: Cytology 06/28/2022 A. LUNG, LUL, BRUSH:  - No malignant cells identified   B. LUNG, LUL, FINE NEEDLE ASPIRATION:  - No malignant cells identified    Super D CT Chest 06/24/2022 Lungs/Pleura: Emphysema. Biapical pleuroparenchymal scarring. Densely calcified 9 mm right middle lobe pulmonary nodule compatible with old granulomatous disease. The endobronchial nodule in the right middle lobe referenced on the prior CT scan has resolved, compatible with mucous plugging.   The sub solid lesion of concern peripherally in the left upper  lobe has a more cephalad component measuring about 2.1 by 01.0 cm on image 53 series 4, and a more caudad component measuring about 1.3 by 0.9 cm on image 58 series 4. Adjacent mild subpleural nodularity 0.3 cm in diameter on image 62 series 4.    PET Scan 06/08/2022 Peripheral ground-glass density LEFT upper lobe measures 3 cm x 1.3 cm and has intense metabolic activity with SUV max equal 7.5 (image 55). No additional hypermetabolic pulmonary nodules.  No hypermetabolic mediastinal lymph nodes. IMPRESSION: 1. Hypermetabolic ground-glass nodule in the LEFT upper lobe is concerning for bronchogenic carcinoma. 2. No evidence of mediastinal nodal metastasis or distant metastatic disease. Some motion degradation in the neck. 3. No evidence skeletal metastasis. 4.  Aortic Atherosclerosis (ICD10-I70.0).      05/09/2022 Low Dose CT Chest Lung Cancer Screening Mediastinum/Nodes: Small hiatal hernia. Thyroid is unremarkable. No pathologically enlarged lymph nodes seen in the chest.   Lungs/Pleura: Central airways are patent. No consolidation, pleural effusion or pneumothorax. Irregular nodular opacity of the left upper lobe is increased in size when compared with prior exam, measuring approximately 9.4 mm on 91, previously measured 3.1 mm. New endobronchial nodule of the right middle lobe measuring 6.1 mm on image 165. Other previously seen pulmonary nodules are stable. Irregular nodular opacity of the left upper lobe is increased in size when compared with prior exam, measuring approximately 9.4 mm. Lung-RADS 4B, suspicious. Additional imaging evaluation or consultation with Pulmonology or Thoracic Surgery recommended. 2. Additional new endobronchial nodule of the right middle lobe, likely due to mucous plugging. Recommend attention on follow-up. 3. Aortic Atherosclerosis (ICD10-I70.0) and  Emphysema (ICD10-J43.9).        Latest Ref Rng & Units 06/28/2022    9:28 AM 03/24/2022    10:25 PM 10/09/2021   11:43 AM  CBC  WBC 4.0 - 10.5 K/uL 6.1  6.7  6.2   Hemoglobin 12.0 - 15.0 g/dL 12.9  12.5  12.9   Hematocrit 36.0 - 46.0 % 38.0  37.3  40.1   Platelets 150 - 400 K/uL 267  281  296        Latest Ref Rng & Units 06/28/2022    9:28 AM 03/24/2022   10:25 PM 10/09/2021   11:43 AM  BMP  Glucose 70 - 99 mg/dL 97  103  136   BUN 8 - 23 mg/dL 12  13  15   $ Creatinine 0.44 - 1.00 mg/dL 1.06  1.07  1.08   Sodium 135 - 145 mmol/L 140  137  141   Potassium 3.5 - 5.1 mmol/L 3.0  3.5  3.2   Chloride 98 - 111 mmol/L 104  101  106   CO2 22 - 32 mmol/L 25  28  28   $ Calcium 8.9 - 10.3 mg/dL 9.7  9.7  10.5     BNP No results found for: "BNP"  ProBNP No results found for: "PROBNP"  PFT No results found for: "FEV1PRE", "FEV1POST", "FVCPRE", "FVCPOST", "TLC", "DLCOUNC", "PREFEV1FVCRT", "PSTFEV1FVCRT"  DG Chest Port 1 View  Result Date: 06/28/2022 CLINICAL DATA:  [id number] S/P bronchoscopy with biopsy [id number] EXAM: PORTABLE CHEST 1 VIEW COMPARISON:  June 24, 2022 FINDINGS: The cardiomediastinal silhouette is unchanged in contour. No pleural effusion. No significant pneumothorax. Increased heterogeneous bibasilar opacities with increased reticular nodularity. Emphysematous changes. Marked gaseous distension of the stomach. Previously described pulmonary nodules are better assessed on prior CT a not well visualized radiographically. IMPRESSION: 1. No significant pneumothorax. 2. Increased heterogeneous bibasilar opacities with increased reticular nodularity. Differential considerations include atelectasis, aspiration or less likely infection. 3. Marked gaseous distension of the stomach. Electronically Signed   By: Valentino Saxon M.D.   On: 06/28/2022 11:15   DG C-ARM BRONCHOSCOPY  Result Date: 06/28/2022 C-ARM BRONCHOSCOPY: Fluoroscopy was utilized by the requesting physician.  No radiographic interpretation.   CT Super D Chest Wo Contrast  Result Date: 06/24/2022 CLINICAL DATA:   Hypermetabolic left upper lobe nodule compatible with malignancy, preprocedural planning. * Tracking Code: BO * EXAM: CT CHEST WITHOUT CONTRAST TECHNIQUE: Multidetector CT imaging of the chest was performed using thin slice collimation for electromagnetic bronchoscopy planning purposes, without intravenous contrast. RADIATION DOSE REDUCTION: This exam was performed according to the departmental dose-optimization program which includes automated exposure control, adjustment of the mA and/or kV according to patient size and/or use of iterative reconstruction technique. COMPARISON:  Multiple exams, including 05/09/2022 FINDINGS: Cardiovascular: Aberrant right subclavian artery passes behind the esophagus. Coronary, aortic arch, and branch vessel atherosclerotic vascular disease. Mediastinum/Nodes: No pathologic adenopathy. Lungs/Pleura: Emphysema. Biapical pleuroparenchymal scarring. Densely calcified 9 mm right middle lobe pulmonary nodule compatible with old granulomatous disease. The endobronchial nodule in the right middle lobe referenced on the prior CT scan has resolved, compatible with mucous plugging. The sub solid lesion of concern peripherally in the left upper lobe has a more cephalad component measuring about 2.1 by 01.0 cm on image 53 series 4, and a more caudad component measuring about 1.3 by 0.9 cm on image 58 series 4. Adjacent mild subpleural nodularity 0.3 cm in diameter on image 62 series 4. Upper Abdomen: We partially image is a 5.6  cm fluid density cyst of the left kidney upper pole, previously photopenic on PET-CT compatible with benign cyst. No further imaging workup of this lesion is indicated. Abdominal aortic atherosclerosis. Musculoskeletal: Mild thoracic spondylosis. IMPRESSION: 1. The sub solid lesion of concern peripherally in the left upper lobe has a more cephalad component measuring about 2.1 by 01.0 cm and a more caudad component measuring about 1.3 by 0.9 cm. Appearance is suspicious  for low-grade adenocarcinoma particularly in light of the accentuated metabolic activity. 2. Interval clearing of the right middle lobe endobronchial filling defect, compatible with previous mucous plugging. 3. Aberrant right subclavian artery passes behind the esophagus. 4. Old granulomatous disease. 5. Aortic atherosclerosis. Aortic Atherosclerosis (ICD10-I70.0). Electronically Signed   By: Van Clines M.D.   On: 06/24/2022 16:21   NM PET Image Initial (PI) Skull Base To Thigh  Result Date: 06/08/2022 CLINICAL DATA:  Initial treatment strategy for lung nodule. EXAM: NUCLEAR MEDICINE PET SKULL BASE TO THIGH TECHNIQUE: 8.5 mCi F-18 FDG was injected intravenously. Full-ring PET imaging was performed from the skull base to thigh after the radiotracer. CT data was obtained and used for attenuation correction and anatomic localization. Fasting blood glucose: 111 mg/dl COMPARISON:  None Available. FINDINGS: Patient was anxious about study. Patient typical Valium prior to scan; however, patient had significant arm motion and head motion in the neck and head on the PET imaging. Mediastinal blood pool activity: SUV max 1.7 Liver activity: SUV max NA NECK: Head motion as described above. No focal activity suggest lymphadenopathy. Physiologic activity in the larynx related to localization during uptake. Incidental CT findings: None. CHEST: Peripheral ground-glass density LEFT upper lobe measures 3 cm x 1.3 cm and has intense metabolic activity with SUV max equal 7.5 (image 55). No additional hypermetabolic pulmonary nodules. No hypermetabolic mediastinal lymph nodes. Incidental CT findings: None. ABDOMEN/PELVIS: No abnormal hypermetabolic activity within the liver, pancreas, adrenal glands, or spleen. No hypermetabolic lymph nodes in the abdomen or pelvis. Incidental CT findings: Large benign simple fluid attenuation 6 cm cyst of LEFT kidney. Atherosclerotic calcification of the aorta. SKELETON: No focal  hypermetabolic activity to suggest skeletal metastasis. Incidental CT findings: None. IMPRESSION: 1. Hypermetabolic ground-glass nodule in the LEFT upper lobe is concerning for bronchogenic carcinoma. 2. No evidence of mediastinal nodal metastasis or distant metastatic disease. Some motion degradation in the neck. 3. No evidence skeletal metastasis. 4.  Aortic Atherosclerosis (ICD10-I70.0). Electronically Signed   By: Suzy Bouchard M.D.   On: 06/08/2022 12:05     Past medical hx Past Medical History:  Diagnosis Date   Anxiety    Atherosclerosis    Bronchitis due to tobacco use    quit smoking 3 months ago   Chronic kidney disease    stage 3a, patient has kidney cyst   Chronic sciatica of left side 04/01/2015   COPD (chronic obstructive pulmonary disease) (HCC)    Depression    Dyspnea    occasional   GERD (gastroesophageal reflux disease)    no current problems and no meds   Hypertension    Mitral valve prolapse    years ago   Pneumonia    years ago   50    age 37 years old   Pre-diabetes    no meds     Social History   Tobacco Use   Smoking status: Every Day    Packs/day: 0.50    Years: 54.00    Total pack years: 27.00    Types: Cigarettes   Smokeless  tobacco: Never   Tobacco comments:    currently smokes a half of pack of ciggs daily. As of 07/05/2022 LW  Vaping Use   Vaping Use: Never used  Substance Use Topics   Alcohol use: Not Currently    Comment: occasional   Drug use: No    Ms.Donn reports that she has been smoking cigarettes. She has a 27.00 pack-year smoking history. She has never used smokeless tobacco. She reports that she does not currently use alcohol. She reports that she does not use drugs.  Tobacco Cessation: Current every day smoker. Smokes 1/2 PPD with a 27 pack year smoking history  Past surgical hx, Family hx, Social hx all reviewed.  Current Outpatient Medications on File Prior to Visit  Medication Sig   aspirin 325 MG tablet  Take 325 mg by mouth as needed for mild pain or headache.   atorvastatin (LIPITOR) 40 MG tablet Take 40 mg by mouth daily.   BIOTIN PO Take 2 capsules by mouth in the morning.   diazepam (VALIUM) 5 MG tablet Take 5-15 mg by mouth daily as needed for anxiety.   DULoxetine (CYMBALTA) 60 MG capsule Take 60 mg by mouth daily.   Homeopathic Products (LEG CRAMP RELIEF) SUBL Place 1 tablet under the tongue daily as needed (leg cramps). [employer]   hydrochlorothiazide (HYDRODIURIL) 25 MG tablet Take 25 mg by mouth daily.    Magnesium Bisglycinate (MAG GLYCINATE) 100 MG TABS Take 300 mg by mouth at bedtime.   zolpidem (AMBIEN) 10 MG tablet Take 10 mg by mouth at bedtime.   Current Facility-Administered Medications on File Prior to Visit  Medication   0.9 %  sodium chloride infusion     Allergies  Allergen Reactions   Amlodipine Other (See Comments)    Edema   Rosuvastatin Rash   Sulfa Antibiotics Other (See Comments)    Unknown    Wellbutrin [Bupropion] Other (See Comments)    Hallucinations   Lisinopril Cough    Review Of Systems:  Constitutional:   No  weight loss, night sweats,  Fevers, chills, fatigue, or  lassitude.  HEENT:   No headaches,  Difficulty swallowing,  Tooth/dental problems, or  Sore throat,                No sneezing, itching, ear ache, nasal congestion, post nasal drip,   CV:  No chest pain,  Orthopnea, PND, swelling in lower extremities, anasarca, dizziness, palpitations, syncope.   GI  No heartburn, indigestion, abdominal pain, nausea, vomiting, diarrhea, change in bowel habits, loss of appetite, bloody stools.   Resp: No shortness of breath with exertion or at rest.  No excess mucus, no productive cough,  No non-productive cough,  No coughing up of blood.  No change in color of mucus.  No wheezing.  No chest wall deformity  Skin: no rash or lesions.  GU: no dysuria, change in color of urine, no urgency or frequency.  No flank pain, no hematuria   MS:  No  joint pain or swelling.  No decreased range of motion.  No back pain.  Psych:  No change in mood or affect. No depression or anxiety.  No memory loss.   Vital Signs BP (!) 140/84 (BP Location: Right Arm, Patient Position: Sitting, Cuff Size: Normal)   Pulse 74   Temp 97.8 F (36.6 C) (Oral)   Ht 5' 2"$  (1.575 m)   Wt 170 lb 3.2 oz (77.2 kg)   SpO2 98%   BMI  31.13 kg/m    Physical Exam:  General- No distress,  A&Ox3, pleasant ENT: No sinus tenderness, TM clear, pale nasal mucosa, no oral exudate,no post nasal drip, no LAN Cardiac: S1, S2, regular rate and rhythm, no murmur Chest: No wheeze/ rales/ dullness; no accessory muscle use, no nasal flaring, no sternal retractions Abd.: Soft Non-tender, ND, BS +, Body mass index is 31.13 kg/m.  Ext: No clubbing cyanosis, edema Neuro:  normal strength, MAE x 4, A&O x 3 Skin: No rashes, warm and dry, no lesions  Psych: normal mood and behavior   Assessment/Plan Abnormal Lung Cancer Screening Scan in current every day smoker  PET scan with Hypermetabolic ground-glass nodule in the LEFT upper lobe is concerning for bronchogenic carcinoma. Plan Your biopsies were negative for malignancy . This is good news. We will do a 3 month follow up CT Chest to continue to watch that nodule. We will see you in the office after the scan to go over the results. Please work on quitting smoking.  Call 1-800-QUIT NOW for free nicotine patches , gum and mints  Follow up with cardiology as you have been doing. Please contact office for sooner follow up if symptoms do not improve or worsen or seek emergency care    BP Elevated in the office Today Pt. Did take her antihypertensive this morning ? Is due to anxiety regarding biopsy results Plan Please follow up with cardiology Check BP at home to ensure it is within normal range Make sure you are taking your BP medication daily as prescribed.   I called patient and reminded her she needs to work on  quitting smoking. She states she has patches that she is going to try to use to help her quit.    I spent 35 minutes dedicated to the care of this patient on the date of this encounter to include pre-visit review of records, face-to-face time with the patient discussing conditions above, post visit ordering of testing, clinical documentation with the electronic health record, making appropriate referrals as documented, and communicating necessary information to the patient's healthcare team.    Magdalen Spatz, NP 07/05/2022  11:40 AM

## 2022-07-05 NOTE — Patient Instructions (Addendum)
It is good to see you today Your biopsies were negative for malignancy . This is good news. We will do a 3 month follow up CT Chest to continue to watch that nodule. We will see you in the office after the scan to go over the results.  Follow up with cardiology as you have been doing. Please contact office for sooner follow up if symptoms do not improve or worsen or seek emergency care

## 2022-07-06 NOTE — Progress Notes (Signed)
Path results reviewed with patient in recent office visit with SG, NP Follow up imaging has been arranged.   Thanks,  BLI  Garner Nash, DO [facility] 07/06/2022 11:46 AM

## 2022-07-15 ENCOUNTER — Ambulatory Visit: Payer: Medicare HMO

## 2022-07-22 ENCOUNTER — Ambulatory Visit: Payer: Medicare HMO | Admitting: Acute Care

## 2022-08-09 ENCOUNTER — Ambulatory Visit (INDEPENDENT_AMBULATORY_CARE_PROVIDER_SITE_OTHER): Payer: Medicare HMO | Admitting: Emergency Medicine

## 2022-08-09 ENCOUNTER — Encounter: Payer: Self-pay | Admitting: Emergency Medicine

## 2022-08-09 VITALS — BP 122/72 | HR 94 | Temp 98.2°F | Ht 62.0 in | Wt 169.0 lb

## 2022-08-09 DIAGNOSIS — I10 Essential (primary) hypertension: Secondary | ICD-10-CM

## 2022-08-09 DIAGNOSIS — B91 Sequelae of poliomyelitis: Secondary | ICD-10-CM

## 2022-08-09 DIAGNOSIS — F17200 Nicotine dependence, unspecified, uncomplicated: Secondary | ICD-10-CM

## 2022-08-09 DIAGNOSIS — R911 Solitary pulmonary nodule: Secondary | ICD-10-CM | POA: Diagnosis not present

## 2022-08-09 DIAGNOSIS — R7303 Prediabetes: Secondary | ICD-10-CM

## 2022-08-09 DIAGNOSIS — G894 Chronic pain syndrome: Secondary | ICD-10-CM | POA: Insufficient documentation

## 2022-08-09 DIAGNOSIS — E785 Hyperlipidemia, unspecified: Secondary | ICD-10-CM

## 2022-08-09 DIAGNOSIS — F5104 Psychophysiologic insomnia: Secondary | ICD-10-CM

## 2022-08-09 DIAGNOSIS — Z7689 Persons encountering health services in other specified circumstances: Secondary | ICD-10-CM

## 2022-08-09 NOTE — Assessment & Plan Note (Signed)
Follows up with pulmonary on a regular basis Under surveillance

## 2022-08-09 NOTE — Assessment & Plan Note (Signed)
Chronic minimal deficits.  Stable.

## 2022-08-09 NOTE — Assessment & Plan Note (Signed)
Diet and nutrition discussed. 

## 2022-08-09 NOTE — Assessment & Plan Note (Signed)
Stable chronic condition Continues atorvastatin 40 mg daily

## 2022-08-09 NOTE — Patient Instructions (Signed)
Health Maintenance After Age 78 After age 78, you are at a higher risk for certain long-term diseases and infections as well as injuries from falls. Falls are a major cause of broken bones and head injuries in people who are older than age 78. Getting regular preventive care can help to keep you healthy and well. Preventive care includes getting regular testing and making lifestyle changes as recommended by your health care provider. Talk with your health care provider about: Which screenings and tests you should have. A screening is a test that checks for a disease when you have no symptoms. A diet and exercise plan that is right for you. What should I know about screenings and tests to prevent falls? Screening and testing are the best ways to find a health problem early. Early diagnosis and treatment give you the best chance of managing medical conditions that are common after age 78. Certain conditions and lifestyle choices may make you more likely to have a fall. Your health care provider may recommend: Regular vision checks. Poor vision and conditions such as cataracts can make you more likely to have a fall. If you wear glasses, make sure to get your prescription updated if your vision changes. Medicine review. Work with your health care provider to regularly review all of the medicines you are taking, including over-the-counter medicines. Ask your health care provider about any side effects that may make you more likely to have a fall. Tell your health care provider if any medicines that you take make you feel dizzy or sleepy. Strength and balance checks. Your health care provider may recommend certain tests to check your strength and balance while standing, walking, or changing positions. Foot health exam. Foot pain and numbness, as well as not wearing proper footwear, can make you more likely to have a fall. Screenings, including: Osteoporosis screening. Osteoporosis is a condition that causes  the bones to get weaker and break more easily. Blood pressure screening. Blood pressure changes and medicines to control blood pressure can make you feel dizzy. Depression screening. You may be more likely to have a fall if you have a fear of falling, feel depressed, or feel unable to do activities that you used to do. Alcohol use screening. Using too much alcohol can affect your balance and may make you more likely to have a fall. Follow these instructions at home: Lifestyle Do not drink alcohol if: Your health care provider tells you not to drink. If you drink alcohol: Limit how much you have to: 0-1 drink a day for women. 0-2 drinks a day for men. Know how much alcohol is in your drink. In the [place] one drink equals one 12 oz bottle of beer (355 mL), one 5 oz glass of wine (148 mL), or one 1 oz glass of hard liquor (44 mL). Do not use any products that contain nicotine or tobacco. These products include cigarettes, chewing tobacco, and vaping devices, such as e-cigarettes. If you need help quitting, ask your health care provider. Activity  Follow a regular exercise program to stay fit. This will help you maintain your balance. Ask your health care provider what types of exercise are appropriate for you. If you need a cane or walker, use it as recommended by your health care provider. Wear supportive shoes that have nonskid soles. Safety  Remove any tripping hazards, such as rugs, cords, and clutter. Install safety equipment such as grab bars in bathrooms and safety rails on stairs. Keep rooms and walkways   well-lit. General instructions Talk with your health care provider about your risks for falling. Tell your health care provider if: You fall. Be sure to tell your health care provider about all falls, even ones that seem minor. You feel dizzy, tiredness (fatigue), or off-balance. Take over-the-counter and prescription medicines only as told by your health care provider. These include  supplements. Eat a healthy diet and maintain a healthy weight. A healthy diet includes low-fat dairy products, low-fat (lean) meats, and fiber from whole grains, beans, and lots of fruits and vegetables. Stay current with your vaccines. Schedule regular health, dental, and eye exams. Summary Having a healthy lifestyle and getting preventive care can help to protect your health and wellness after age 78. Screening and testing are the best way to find a health problem early and help you avoid having a fall. Early diagnosis and treatment give you the best chance for managing medical conditions that are more common for people who are older than age 78. Falls are a major cause of broken bones and head injuries in people who are older than age 78. Take precautions to prevent a fall at home. Work with your health care provider to learn what changes you can make to improve your health and wellness and to prevent falls. This information is not intended to replace advice given to you by your health care provider. Make sure you discuss any questions you have with your health care provider. Document Revised: 09/28/2020 Document Reviewed: 09/28/2020 Elsevier Patient Education  2023 Elsevier Inc.  

## 2022-08-09 NOTE — Assessment & Plan Note (Signed)
1 to 2 packs/day for many years. Not motivated to quit Cardiovascular and cancer risk associated with smoking discussed Smoking cessation advice given.

## 2022-08-09 NOTE — Assessment & Plan Note (Signed)
Secondary to chronic back issues. Sees orthopedist Dr. Lorin Mercy on a regular basis Pain management handled by his office.

## 2022-08-09 NOTE — Assessment & Plan Note (Signed)
Takes Ambien as needed

## 2022-08-09 NOTE — Assessment & Plan Note (Signed)
Well-controlled hypertension Continue hydrochlorothiazide 25 mg daily BP Readings from Last 3 Encounters:  08/09/22 122/72  07/05/22 (!) 140/84  06/28/22 121/67

## 2022-08-09 NOTE — Assessment & Plan Note (Signed)
Continues to smoke.  Stable. Not using any inhalers at present time

## 2022-08-09 NOTE — Progress Notes (Signed)
[name] 77 y.o.   Chief Complaint  Patient presents with   Establish Care    Sinus issue, mainly runny nose.    HISTORY OF PRESENT ILLNESS: This is a 77 y.o. female first visit to the office, here to establish care with me Has the following chronic medical problems: 1.  Hypertension on hydrochlorothiazide 2.  History of polio as a child with late effects of poliomyelitis 3.  Chronic pain syndrome.  Sees Dr. Lorin Mercy on a regular basis who is handling her pain management 4.  COPD and chronic smoker 1 to 2 packs/day 5.  Chronic kidney disease Also occasional sinus issues with runny nose. No other complaint or medical concerns today.  HPI   Prior to Admission medications   Medication Sig Start Date End Date Taking? Authorizing Provider  aspirin 325 MG tablet Take 325 mg by mouth as needed for mild pain or headache.   Yes [provider]  atorvastatin (LIPITOR) 40 MG tablet Take 40 mg by mouth daily. 04/08/16  Yes [provider]  benzonatate (TESSALON) 100 MG capsule Take 100 mg by mouth as needed. 05/02/17  Yes [provider]  BIOTIN PO Take 2 capsules by mouth in the morning.   Yes [provider]  diazepam (VALIUM) 5 MG tablet Take 5-15 mg by mouth daily as needed for anxiety.   Yes [provider]  DULoxetine (CYMBALTA) 60 MG capsule Take 60 mg by mouth daily. 12/25/13  Yes [provider]  Homeopathic Products (LEG CRAMP RELIEF) SUBL Place 1 tablet under the tongue daily as needed (leg cramps). Hyland [employer]   Yes [provider]  hydrochlorothiazide (HYDRODIURIL) 25 MG tablet Take 25 mg by mouth daily.  09/26/17  Yes [provider]  HYDROcodone-acetaminophen (NORCO) 7.5-325 MG tablet Take 1 tablet by mouth as needed. 10/12/18  Yes [provider]  Magnesium Bisglycinate (MAG GLYCINATE) 100 MG TABS Take 300 mg by mouth at bedtime.   Yes [provider]  zolpidem (AMBIEN) 10 MG tablet Take  10 mg by mouth at bedtime.   Yes [provider]    Allergies  Allergen Reactions   Amlodipine Other (See Comments)    Edema   Rosuvastatin Rash   Sulfa Antibiotics Other (See Comments)    Unknown    Wellbutrin [Bupropion] Other (See Comments)    Hallucinations   Lisinopril Cough    Patient Active Problem List   Diagnosis Date Noted   Lung nodule 06/09/2022   Chronic sciatica of left side 04/01/2015   HNP (herniated nucleus pulposus), lumbar 02/21/2014   Synovial cyst of lumbar spine 02/21/2014   Lumbar foraminal stenosis 02/21/2014    Past Medical History:  Diagnosis Date   Anxiety    Atherosclerosis    Bronchitis due to tobacco use    quit smoking 3 months ago   Chronic kidney disease    stage 3a, patient has kidney cyst   Chronic sciatica of left side 04/01/2015   COPD (chronic obstructive pulmonary disease) (HCC)    Depression    Dyspnea    occasional   GERD (gastroesophageal reflux disease)    no current problems and no meds   Hypertension    Mitral valve prolapse    years ago   Pneumonia    years ago   23    age 31 years old   Pre-diabetes    no meds    Past Surgical History:  Procedure Laterality Date   ABDOMINAL HYSTERECTOMY  [place]     BRONCHIAL BIOPSY  06/28/2022   Procedure: BRONCHIAL BIOPSIES;  Surgeon: Garner Nash, DO;  Location: [facility];  Service: Pulmonary;;   BRONCHIAL BRUSHINGS  06/28/2022   Procedure: BRONCHIAL BRUSHINGS;  Surgeon: Garner Nash, DO;  Location: [facility];  Service: Pulmonary;;   COLONOSCOPY  2017   EYE SURGERY     cataracts removed bilateral   left leg surgery Left    LUMBAR LAMINECTOMY Left 02/21/2014   Procedure: Left L5-S1 Hemilaminectomy, Lateral Recess Decompression, Removal Synovial Cyst;  Surgeon: Marybelle Killings, MD;  Location: [facility];  Service: Orthopedics;  Laterality: Left;   MICROLARYNGOSCOPY N/A 10/11/2018   Procedure: MICROLARYNGOSCOPY with Biopsy;  Surgeon: Izora Gala, MD;  Location: [facility];  Service: ENT;  Laterality: N/A;   polio Bilateral 1950's   surgery on feet and legs for polio   TONSILLECTOMY      Social History   Socioeconomic History   Marital status: Married    Spouse name: Herbie [place]   Number of children: 1   Years of education: [employer] education level: Not on file  Occupational History   Occupation: Retired    Fish farm manager: OTHER  Tobacco Use   Smoking status: Every Day    Packs/day: 1.50    Years: 30.00    Additional pack years: 0.00    Total pack years: 45.00    Types: Cigarettes   Smokeless tobacco: Never   Tobacco comments:    currently smokes a half of pack of ciggs daily. As of 07/05/2022 LW  Vaping Use   Vaping Use: Never used  Substance and Sexual Activity   Alcohol use: Not Currently    Comment: occasional   Drug use: No   Sexual activity: Not Currently    Birth control/protection: Surgical    Comment: Hysterectomy  Other Topics Concern   Not on file  Social History Narrative   Patient lives at home with her spouse.   Caffeine Use: daily   Social Determinants of Health   Financial Resource Strain: Not on file  Food Insecurity: Not on file  Transportation Needs: Not on file  Physical Activity: Not on file  Stress: Not on file  Social Connections: Not on file  Intimate Partner Violence: Not on file    Family History  Problem Relation Age of Onset   Cancer Mother        thyroid   Heart attack Father    Cancer Sister        breast   Kidney disease Sister        ESRD   Irritable bowel syndrome Sister    Cancer Sister        thyroid   Colon cancer Neg Hx    Esophageal cancer Neg Hx      Review of Systems  Constitutional: Negative.  Negative for chills and fever.  HENT:  Positive for congestion and sinus pain.   Respiratory: Negative.  Negative for cough and shortness of breath.   Cardiovascular: Negative.  Negative for chest pain and palpitations.  Gastrointestinal:  Negative for  abdominal pain, diarrhea, nausea and vomiting.  Genitourinary: Negative.  Negative for dysuria and hematuria.  Skin: Negative.  Negative for rash.  Neurological: Negative.  Negative for dizziness and headaches.  All other systems reviewed and are negative.   Today's Vitals   08/09/22 1352  BP: 122/72  Pulse: 94  Temp: 98.2 F (36.8 C)  TempSrc: Oral  SpO2: 97%  Weight: 169 lb (76.7 kg)  Height: 5\' 2"  (1.575 m)   Body mass index is 30.91 kg/m.   Physical Exam Vitals reviewed.  Constitutional:      Appearance: Normal appearance.  HENT:     Head: Normocephalic.  Eyes:     Extraocular Movements: Extraocular movements intact.  Cardiovascular:     Rate and Rhythm: Normal rate.  Pulmonary:     Effort: Pulmonary effort is normal.  Skin:    General: Skin is warm and dry.  Neurological:     Mental Status: She is alert and oriented to person, place, and time.  Psychiatric:        Mood and Affect: Mood normal.        Behavior: Behavior normal.      ASSESSMENT & PLAN: A total of 48 minutes was spent with the patient and counseling/coordination of care regarding preparing for this visit, review of available medical records, review of multiple chronic medical conditions under management, review of all medications, comprehensive history and physical examination, smoking cessation advice, cardiovascular and cancer risks associated with smoking, prognosis, review of health maintenance items, documentation, and need for follow-up.  Problem List Items Addressed This Visit       Cardiovascular and Mediastinum   Essential hypertension - Primary    Well-controlled hypertension Continue hydrochlorothiazide 25 mg daily BP Readings from Last 3 Encounters:  08/09/22 122/72  07/05/22 (!) 140/84  06/28/22 121/67          Respiratory   Lung nodule    Follows up with pulmonary on a regular basis Under surveillance        Nervous and Auditory   Late effects of poliomyelitis     Chronic minimal deficits.  Stable.        Other   Prediabetes    Diet and nutrition discussed      Hyperlipidemia LDL goal <130    Stable chronic condition Continues atorvastatin 40 mg daily      Chronic insomnia    Takes Ambien as needed      Chronic pain syndrome    Secondary to chronic back issues. Sees orthopedist Dr. Lorin Mercy on a regular basis Pain management handled by his office.      Relevant Medications   HYDROcodone-acetaminophen (NORCO) 7.5-325 MG tablet   Current smoker    1 to 2 packs/day for many years. Not motivated to quit Cardiovascular and cancer risk associated with smoking discussed Smoking cessation advice given.      Other Visit Diagnoses     Encounter to establish care          Patient Instructions  Health Maintenance After Age 21 After age 85, you are at a higher risk for certain long-term diseases and infections as well as injuries from falls. Falls are a major cause of broken bones and head injuries in people who are older than age 64. Getting regular preventive care can help to keep you healthy and well. Preventive care includes getting regular testing and making lifestyle changes as recommended by your health care provider. Talk with your health care provider about: Which screenings and tests you should have. A screening is a test that checks for a disease when you have no symptoms. A diet and exercise plan that is right for you. What should I know about screenings and tests to prevent falls? Screening and testing are the best ways to find a health problem early. Early diagnosis and treatment give you the  best chance of managing medical conditions that are common after age 68. Certain conditions and lifestyle choices may make you more likely to have a fall. Your health care provider may recommend: Regular vision checks. Poor vision and conditions such as cataracts can make you more likely to have a fall. If you wear glasses, make sure to get  your prescription updated if your vision changes. Medicine review. Work with your health care provider to regularly review all of the medicines you are taking, including over-the-counter medicines. Ask your health care provider about any side effects that may make you more likely to have a fall. Tell your health care provider if any medicines that you take make you feel dizzy or sleepy. Strength and balance checks. Your health care provider may recommend certain tests to check your strength and balance while standing, walking, or changing positions. Foot health exam. Foot pain and numbness, as well as not wearing proper footwear, can make you more likely to have a fall. Screenings, including: Osteoporosis screening. Osteoporosis is a condition that causes the bones to get weaker and break more easily. Blood pressure screening. Blood pressure changes and medicines to control blood pressure can make you feel dizzy. Depression screening. You may be more likely to have a fall if you have a fear of falling, feel depressed, or feel unable to do activities that you used to do. Alcohol use screening. Using too much alcohol can affect your balance and may make you more likely to have a fall. Follow these instructions at home: Lifestyle Do not drink alcohol if: Your health care provider tells you not to drink. If you drink alcohol: Limit how much you have to: 0-1 drink a day for women. 0-2 drinks a day for men. Know how much alcohol is in your drink. In the [place] one drink equals one 12 oz bottle of beer (355 mL), one 5 oz glass of wine (148 mL), or one 1 oz glass of hard liquor (44 mL). Do not use any products that contain nicotine or tobacco. These products include cigarettes, chewing tobacco, and vaping devices, such as e-cigarettes. If you need help quitting, ask your health care provider. Activity  Follow a regular exercise program to stay fit. This will help you maintain your balance. Ask your  health care provider what types of exercise are appropriate for you. If you need a cane or walker, use it as recommended by your health care provider. Wear supportive shoes that have nonskid soles. Safety  Remove any tripping hazards, such as rugs, cords, and clutter. Install safety equipment such as grab bars in bathrooms and safety rails on stairs. Keep rooms and walkways well-lit. General instructions Talk with your health care provider about your risks for falling. Tell your health care provider if: You fall. Be sure to tell your health care provider about all falls, even ones that seem minor. You feel dizzy, tiredness (fatigue), or off-balance. Take over-the-counter and prescription medicines only as told by your health care provider. These include supplements. Eat a healthy diet and maintain a healthy weight. A healthy diet includes low-fat dairy products, low-fat (lean) meats, and fiber from whole grains, beans, and lots of fruits and vegetables. Stay current with your vaccines. Schedule regular health, dental, and eye exams. Summary Having a healthy lifestyle and getting preventive care can help to protect your health and wellness after age 65. Screening and testing are the best way to find a health problem early and help you avoid having a  fall. Early diagnosis and treatment give you the best chance for managing medical conditions that are more common for people who are older than age 44. Falls are a major cause of broken bones and head injuries in people who are older than age 35. Take precautions to prevent a fall at home. Work with your health care provider to learn what changes you can make to improve your health and wellness and to prevent falls. This information is not intended to replace advice given to you by your health care provider. Make sure you discuss any questions you have with your health care provider. Document Revised: 09/28/2020 Document Reviewed:  09/28/2020 Elsevier Patient Education  [place], MD [facility]

## 2022-08-24 ENCOUNTER — Other Ambulatory Visit: Payer: Self-pay | Admitting: Emergency Medicine

## 2022-08-24 ENCOUNTER — Telehealth: Payer: Self-pay | Admitting: Emergency Medicine

## 2022-08-24 MED ORDER — ZOLPIDEM TARTRATE 10 MG PO TABS: 10.0000 mg | ORAL_TABLET | Freq: Every evening | ORAL | 1 refills | Status: DC | PRN

## 2022-08-24 NOTE — Telephone Encounter (Signed)
Prescription Request  08/24/2022  LOV: 08/09/2022  What is the name of the medication or equipment?  zolpidem (AMBIEN) 10 MG tablet   Have you contacted your pharmacy to request a refill? Yes   Which pharmacy would you like this sent to?  [facility], [place] [address] Phone: [phone] Fax: [phone]  Patient notified that their request is being sent to the clinical staff for review and that they should receive a response within 2 business days.   Please advise at [place]

## 2022-08-24 NOTE — Telephone Encounter (Signed)
You meant to write Ambien?  Not Ativan.  New prescription sent to pharmacy of record today.  Thanks.

## 2022-08-29 ENCOUNTER — Ambulatory Visit: Payer: Medicare HMO | Admitting: Orthopaedic Surgery

## 2022-08-29 ENCOUNTER — Encounter: Payer: Self-pay | Admitting: Orthopaedic Surgery

## 2022-08-29 ENCOUNTER — Other Ambulatory Visit (INDEPENDENT_AMBULATORY_CARE_PROVIDER_SITE_OTHER): Payer: Medicare HMO

## 2022-08-29 VITALS — BP 143/71 | HR 58 | Ht 62.0 in | Wt 169.0 lb

## 2022-08-29 DIAGNOSIS — M79602 Pain in left arm: Secondary | ICD-10-CM

## 2022-08-29 DIAGNOSIS — M48061 Spinal stenosis, lumbar region without neurogenic claudication: Secondary | ICD-10-CM | POA: Diagnosis not present

## 2022-08-29 DIAGNOSIS — G5622 Lesion of ulnar nerve, left upper limb: Secondary | ICD-10-CM | POA: Diagnosis not present

## 2022-08-29 MED ORDER — PREDNISONE 5 MG (21) PO TBPK: ORAL_TABLET | ORAL | 0 refills | Status: DC

## 2022-08-29 NOTE — Progress Notes (Signed)
Office Visit Note   Patient: [name]           Date of Birth: 08/18/1945           MRN: [id number] Visit Date: 08/29/2022              Requested by: Georgina QuintSagardia, Miguel Jose, MD [address] [place] [id number] PCP: Georgina QuintSagardia, Miguel Jose, MD   Assessment & Plan: Visit Diagnoses:  1. Left arm pain   2. Cubital tunnel syndrome on left   3. Lumbar foraminal stenosis     Plan: Will proceed with prednisone Dosepak for back at her request.  Will set her up for EMG nerve conduction velocities to evaluate for cubital tunnel.  Follow-Up Instructions: No follow-ups on file.   Orders:  Orders Placed This Encounter  Procedures   XR Cervical Spine 2 or 3 views   XR Elbow 2 Views Left   Ambulatory referral to Physical Medicine Rehab   Meds ordered this encounter  Medications   predniSONE (STERAPRED UNI-PAK 21 TAB) 5 MG (21) TBPK tablet    Sig: Take 6,5,4,3,2,1 one tablet less each day, take with food    Dispense:  21 tablet    Refill:  0      Procedures: No procedures performed   Clinical Data: No additional findings.   Subjective: Chief Complaint  Patient presents with   Lower Back - Pain   Left Arm - Pain    HPI  Review of Systems   Objective: Vital Signs: BP (!) 143/71   Pulse (!) 58   Ht 5\' 2"  (1.575 m)   Wt 169 lb (76.7 kg)   BMI 30.91 kg/m   Physical Exam Constitutional:      Appearance: She is well-developed.  HENT:     Head: Normocephalic.     Right Ear: External ear normal.     Left Ear: External ear normal. There is no impacted cerumen.  Eyes:     Pupils: Pupils are equal, round, and reactive to light.  Neck:     Thyroid: No thyromegaly.     Trachea: No tracheal deviation.  Cardiovascular:     Rate and Rhythm: Normal rate.  Pulmonary:     Effort: Pulmonary effort is normal.  Abdominal:     Palpations: Abdomen is soft.  Musculoskeletal:     Cervical back: No rigidity.  Skin:    General: Skin is warm and dry.   Neurological:     Mental Status: She is alert and oriented to person, place, and time.  Psychiatric:        Behavior: Behavior normal.     Ortho Exam positive Tinel's over the cubital tunnel left negative on the right no interosseous weakness.  No hypothenar weakness.  Slight wrist flexion resisted weakness.  Triceps is normal.  Bilateral brachial plexus tenderness.  Negative impingement shoulders.  Biceps triceps brachial radialis reflex are 1+ and symmetrical.  No lower extremity clonus.  Some pain with straight leg raising 90 degrees both right and left.  Specialty Comments:  MRI LUMBAR SPINE WITHOUT CONTRAST     TECHNIQUE:  Multiplanar, multisequence MR imaging of the lumbar spine was  performed. No intravenous contrast was administered.     COMPARISON:  Lumbar MRI 05/08/2018     FINDINGS:  Segmentation:  Normal     Alignment:  Mild retrolisthesis L1-2, L2-3, L3-4.     Vertebrae:  Normal bone marrow.  Negative for fracture or mass.  Conus medullaris and cauda equina: Conus extends to the L2 level.  Conus and cauda equina appear normal.     Paraspinal and other soft tissues: Negative for paraspinous mass or  adenopathy     Disc levels:     T11-12: Diffuse disc bulging with mild spinal stenosis     T12-L1: Negative     L1-2: Moderate disc degeneration with disc bulging and endplate  spurring. Mild facet degeneration. Small left-sided disc protrusion  with mild left subarticular stenosis. No interval change.     L2-3: Moderate to advanced disc degeneration with disc space  narrowing, disc bulging and diffuse endplate spurring. Moderate  facet hypertrophy. Mild spinal stenosis and mild subarticular  stenosis bilaterally unchanged.     L3-4: Diffuse disc bulging and moderate facet hypertrophy. Moderate  spinal stenosis and moderate subarticular stenosis bilaterally. No  interval change.     L4-5: Disc degeneration with diffuse disc bulging. Small left   paracentral disc protrusion is unchanged. Bilateral facet  hypertrophy. Moderate subarticular stenosis bilaterally appears  unchanged.     L5-S1: Left laminotomy is chronic and unchanged. Disc degeneration  with diffuse endplate spurring. Moderate subarticular and foraminal  stenosis on the right. Moderate to severe subarticular and foraminal  stenosis on the left due to spurring is unchanged.     IMPRESSION:  Advanced multilevel disc and facet degeneration throughout the  lumbar spine causing spinal and foraminal stenosis as described  above.     Prior laminectomy left L5-S1. Moderate to severe subarticular and  foraminal stenosis on the left at L5-S1 due to spurring is  unchanged. Left paracentral disc protrusion L4-5 unchanged.     No significant change from the prior MRI 2019.        Electronically Signed    By: Marlan Palau M.D.    On: 09/18/2019 11:05  Imaging: XR Cervical Spine 2 or 3 views  Result Date: 08/29/2022 AP lateral cervical spine images are obtained and reviewed this shows multilevel disc space narrowing with 1 mm retrolisthesis changes at C3-4, C4-5 C5-6 and C6-7. Impression: Cervical spondylosis with multilevel disc space narrowing and facet arthropathy  XR Elbow 2 Views Left  Result Date: 08/29/2022 AP lateral left elbow x-rays are obtained and reviewed this shows normal bone anatomy no subluxation no degenerative changes. Impression: Normal left elbow x-rays.    PMFS History: Patient Active Problem List   Diagnosis Date Noted   Cubital tunnel syndrome on left 08/29/2022   Chronic pain syndrome 08/09/2022   Current smoker 08/09/2022   Lung nodule 06/09/2022   Other fatigue 12/21/2021   DOE (dyspnea on exertion) 12/21/2021   Positive urine drug screen 06/26/2020   Prediabetes 06/19/2020   CKD (chronic kidney disease) stage 3, GFR 30-59 ml/min 06/19/2020   Severe episode of recurrent major depressive disorder, without psychotic features 06/18/2020    Irritable bowel syndrome with diarrhea 06/17/2020   Late effects of poliomyelitis 06/17/2020   Chronic obstructive pulmonary disease, unspecified 06/17/2020   Chronic insomnia 03/24/2020   Proteinuria 03/18/2020   Essential hypertension 03/03/2020   Gastroesophageal reflux disease without esophagitis 03/03/2020   Hyperlipidemia LDL goal <130 03/03/2020   Smokes 2 packs of cigarettes per day 05/02/2017   Pharyngoesophageal dysphagia 05/02/2017   Laryngopharyngeal reflux (LPR) 05/02/2017   Hoarseness 05/02/2017   Chronic sciatica of left side 04/01/2015   HNP (herniated nucleus pulposus), lumbar 02/21/2014   Synovial cyst of lumbar spine 02/21/2014   Lumbar foraminal stenosis 02/21/2014   Past Medical History:  Diagnosis Date   Anxiety    Atherosclerosis    Bronchitis due to tobacco use    quit smoking 3 months ago   Chronic kidney disease    stage 3a, patient has kidney cyst   Chronic sciatica of left side 04/01/2015   COPD (chronic obstructive pulmonary disease)    Depression    Dyspnea    occasional   GERD (gastroesophageal reflux disease)    no current problems and no meds   Hypertension    Mitral valve prolapse    years ago   Pneumonia    years ago   Polio    age 31 years old   Pre-diabetes    no meds    Family History  Problem Relation Age of Onset   Cancer Mother        thyroid   Heart attack Father    Cancer Sister        breast   Kidney disease Sister        ESRD   Irritable bowel syndrome Sister    Cancer Sister        thyroid   Colon cancer Neg Hx    Esophageal cancer Neg Hx     Past Surgical History:  Procedure Laterality Date   ABDOMINAL HYSTERECTOMY  1985   BACK SURGERY     BRONCHIAL BIOPSY  06/28/2022   Procedure: BRONCHIAL BIOPSIES;  Surgeon: Josephine Igo, DO;  Location: [facility];  Service: Pulmonary;;   BRONCHIAL BRUSHINGS  06/28/2022   Procedure: BRONCHIAL BRUSHINGS;  Surgeon: Josephine Igo, DO;  Location: [facility];   Service: Pulmonary;;   COLONOSCOPY  2017   EYE SURGERY     cataracts removed bilateral   left leg surgery Left    LUMBAR LAMINECTOMY Left 02/21/2014   Procedure: Left L5-S1 Hemilaminectomy, Lateral Recess Decompression, Removal Synovial Cyst;  Surgeon: Eldred Manges, MD;  Location: [facility] OR;  Service: Orthopedics;  Laterality: Left;   MICROLARYNGOSCOPY N/A 10/11/2018   Procedure: MICROLARYNGOSCOPY with Biopsy;  Surgeon: Serena Colonel, MD;  Location: [facility] OR;  Service: ENT;  Laterality: N/A;   polio Bilateral 1950's   surgery on feet and legs for polio   TONSILLECTOMY     Social History   Occupational History   Occupation: Retired    Associate Professor: [employer]  Tobacco Use   Smoking status: Every Day    Packs/day: 1.50    Years: 30.00    Additional pack years: 0.00    Total pack years: 45.00    Types: Cigarettes   Smokeless tobacco: Never   Tobacco comments:    currently smokes a half of pack of ciggs daily. As of 07/05/2022 LW  Vaping Use   Vaping Use: Never used  Substance and Sexual Activity   Alcohol use: Not Currently    Comment: occasional   Drug use: No   Sexual activity: Not Currently    Birth control/protection: Surgical    Comment: Hysterectomy

## 2022-09-05 ENCOUNTER — Telehealth: Payer: Self-pay | Admitting: Physical Medicine and Rehabilitation

## 2022-09-05 NOTE — Telephone Encounter (Signed)
[facility]

## 2022-09-05 NOTE — Telephone Encounter (Signed)
scheduled

## 2022-09-08 ENCOUNTER — Telehealth: Payer: Self-pay | Admitting: Orthopaedic Surgery

## 2022-09-08 DIAGNOSIS — M48061 Spinal stenosis, lumbar region without neurogenic claudication: Secondary | ICD-10-CM

## 2022-09-08 NOTE — Telephone Encounter (Signed)
Referral entered  

## 2022-09-08 NOTE — Addendum Note (Signed)
Addended by: Rogers Seeds on: 09/08/2022 05:02 PM   Modules accepted: Orders

## 2022-09-08 NOTE — Telephone Encounter (Signed)
Patient called in stating the prednisone is not working very well and she would like to move forward with the epidural injection with [facility] please advise

## 2022-09-08 NOTE — Telephone Encounter (Signed)
Please advise. OK for referral for ESI?

## 2022-09-09 ENCOUNTER — Telehealth: Payer: Self-pay | Admitting: Physical Medicine and Rehabilitation

## 2022-09-09 NOTE — Telephone Encounter (Signed)
I called patient and advised, referral entered.

## 2022-09-09 NOTE — Telephone Encounter (Signed)
Spoke with patient this morning via telephone, reports greater than 80% relief of pain with prior left L5 transforaminal epidural steroid injection performed in our office on 02/15/2023. Same symptoms, no new injuries. She would like to repeat injection.

## 2022-09-13 ENCOUNTER — Ambulatory Visit (INDEPENDENT_AMBULATORY_CARE_PROVIDER_SITE_OTHER): Payer: Medicare HMO | Admitting: Emergency Medicine

## 2022-09-13 ENCOUNTER — Encounter: Payer: Self-pay | Admitting: Emergency Medicine

## 2022-09-13 VITALS — BP 118/68 | HR 76 | Temp 98.2°F | Ht 62.0 in | Wt 167.0 lb

## 2022-09-13 DIAGNOSIS — G894 Chronic pain syndrome: Secondary | ICD-10-CM

## 2022-09-13 DIAGNOSIS — G8929 Other chronic pain: Secondary | ICD-10-CM

## 2022-09-13 DIAGNOSIS — M545 Low back pain, unspecified: Secondary | ICD-10-CM

## 2022-09-13 DIAGNOSIS — I10 Essential (primary) hypertension: Secondary | ICD-10-CM | POA: Diagnosis not present

## 2022-09-13 LAB — URINALYSIS, ROUTINE W REFLEX MICROSCOPIC
Bilirubin Urine: NEGATIVE
Hgb urine dipstick: NEGATIVE
Ketones, ur: NEGATIVE
Nitrite: NEGATIVE
Specific Gravity, Urine: 1.015 (ref 1.000–1.030)
Urine Glucose: NEGATIVE
Urobilinogen, UA: 0.2 (ref 0.0–1.0)
pH: 6 (ref 5.0–8.0)

## 2022-09-13 LAB — CBC WITH DIFFERENTIAL/PLATELET
Basophils Absolute: 0.1 10*3/uL (ref 0.0–0.1)
Basophils Relative: 0.9 % (ref 0.0–3.0)
Eosinophils Absolute: 0.5 10*3/uL (ref 0.0–0.7)
Eosinophils Relative: 8 % — ABNORMAL HIGH (ref 0.0–5.0)
HCT: 38.3 % (ref 36.0–46.0)
Hemoglobin: 12.8 g/dL (ref 12.0–15.0)
Lymphocytes Relative: 22.3 % (ref 12.0–46.0)
Lymphs Abs: 1.3 10*3/uL (ref 0.7–4.0)
MCHC: 33.4 g/dL (ref 30.0–36.0)
MCV: 90.6 fl (ref 78.0–100.0)
Monocytes Absolute: 0.8 10*3/uL (ref 0.1–1.0)
Monocytes Relative: 14 % — ABNORMAL HIGH (ref 3.0–12.0)
Neutro Abs: 3.3 10*3/uL (ref 1.4–7.7)
Neutrophils Relative %: 54.8 % (ref 43.0–77.0)
Platelets: 260 10*3/uL (ref 150.0–400.0)
RBC: 4.23 Mil/uL (ref 3.87–5.11)
RDW: 15.7 % — ABNORMAL HIGH (ref 11.5–15.5)
WBC: 6 10*3/uL (ref 4.0–10.5)

## 2022-09-13 LAB — COMPREHENSIVE METABOLIC PANEL
ALT: 19 U/L (ref 0–35)
AST: 23 U/L (ref 0–37)
Albumin: 3.7 g/dL (ref 3.5–5.2)
Alkaline Phosphatase: 77 U/L (ref 39–117)
BUN: 19 mg/dL (ref 6–23)
CO2: 33 mEq/L — ABNORMAL HIGH (ref 19–32)
Calcium: 10.2 mg/dL (ref 8.4–10.5)
Chloride: 102 mEq/L (ref 96–112)
Creatinine, Ser: 1.13 mg/dL (ref 0.40–1.20)
GFR: 46.8 mL/min — ABNORMAL LOW (ref >60.00)
Glucose, Bld: 128 mg/dL — ABNORMAL HIGH (ref 70–99)
Potassium: 4.1 mEq/L (ref 3.5–5.1)
Sodium: 139 mEq/L (ref 135–145)
Total Bilirubin: 0.4 mg/dL (ref 0.2–1.2)
Total Protein: 6.5 g/dL (ref 6.0–8.3)

## 2022-09-13 NOTE — Patient Instructions (Signed)
Chronic Back Pain Chronic back pain is back pain that lasts longer than 3 months. The pain may get worse at certain times (flare-ups). There are things you can do at home to manage your pain. Follow these instructions at home: Watch for any changes in your symptoms. Take these actions to help with your pain: Managing pain and stiffness     If told, put ice on the painful area. You may be told to use ice for 24-48 hours after a flare-up starts. Put ice in a plastic bag. Place a towel between your skin and the bag. Leave the ice on for 20 minutes, 2-3 times a day. If told, put heat on the painful area. Do this as often as told by your doctor. Use the heat source that your doctor recommends, such as a moist heat pack or a heating pad. Place a towel between your skin and the heat source. Leave the heat on for 20-30 minutes. If your skin turns bright red, take off the ice or heat right away to prevent skin damage. The risk of damage is higher if you cannot feel pain, heat, or cold. Soak in a warm bath. This can help with pain. Activity        Avoid bending and other activities that make the pain worse. When you stand: Keep your upper back and neck straight. Keep your shoulders pulled back. Avoid slouching. When you sit: Keep your back straight. Relax your shoulders. Do not round your shoulders or pull them backward. Do not sit or stand in one place for too long. Take short rest breaks during the day. Lying down or standing is often better than sitting. Resting can help relieve pain. When sitting or lying down for a long time, do some mild activity or stretching. This will help to prevent stiffness and pain. Get regular exercise. Ask your doctor what activities are safe for you. You may have to avoid lifting. Ask your provider how much you can safely lift. If you lift things: Bend your knees. Keep the weight close to your body. Avoid twisting. Medicines Take over-the-counter and  prescription medicines only as told by your doctor. You may need to take medicines for pain and swelling. These may be taken by mouth or put on the skin. You may also be given muscle relaxants. Ask your doctor if the medicine prescribed to you: Requires you to avoid driving or using machinery. Can cause trouble pooping (constipation). You may need to take these actions to prevent or treat trouble pooping: Drink enough fluid to keep your pee (urine) pale yellow. Take over-the-counter or prescription medicines. Eat foods that are high in fiber. These include beans, whole grains, and fresh fruits and vegetables. Limit foods that are high in fat and sugars. These include fried or sweet foods. General instructions  Sleep on a firm mattress. Try lying on your side with your knees slightly bent. If you lie on your back, put a pillow under your knees. Do not smoke or use any products that contain nicotine or tobacco. If you need help quitting, ask your doctor. Contact a doctor if: Your pain does not get better with rest or medicine. You have new pain. You have a fever. You lose weight quickly. You have trouble doing your normal activities. One or both of your legs or feet feel weak. One or both of your legs or feet lose feeling (have numbness). Get help right away if: You are not able to control when you pee   or poop. You have bad back pain and: You feel like you may vomit (nauseous). You vomit. You have pain in your chest or your belly (abdomen). You have shortness of breath. You faint. These symptoms may be an emergency. Get help right away. Call 911. Do not wait to see if the symptoms will go away. Do not drive yourself to the hospital. This information is not intended to replace advice given to you by your health care provider. Make sure you discuss any questions you have with your health care provider. Document Revised: 12/27/2021 Document Reviewed: 12/27/2021 Elsevier Patient Education   2023 [employer]  

## 2022-09-13 NOTE — Progress Notes (Signed)
[name] 77 y.o.   Chief Complaint  Patient presents with   Pain    Pt states having pain left lower back. Pt states having a CT in December saying it was a kidney cyst. Pt wants labs done.     HISTORY OF PRESENT ILLNESS: This is a 77 y.o. female complaining of left sided low back/sacral pain for the past several weeks. Has history of chronic low back pain.  Sees orthopedist on a regular basis.  Gets epidural injections on a regular basis. Chronic pain handled by orthopedist. However states this pain feels different.  Requesting blood work. Denies any urinary symptoms.  Moving her bowels okay.  Takes occasional stool softeners. States that she does not normally eat much or drink plenty fluids Denies nausea or vomiting.  Denies abdominal pain.  Denies fever or chills. Denies any other associated symptoms No other complaints or medical concerns today.  HPI   Prior to Admission medications   Medication Sig Start Date End Date Taking? Authorizing Provider  aspirin 325 MG tablet Take 325 mg by mouth as needed for mild pain or headache.   Yes [provider]  atorvastatin (LIPITOR) 40 MG tablet Take 40 mg by mouth daily. 04/08/16  Yes [provider]  benzonatate (TESSALON) 100 MG capsule Take 100 mg by mouth as needed. 05/02/17  Yes [provider]  BIOTIN PO Take 2 capsules by mouth in the morning.   Yes [provider]  diazepam (VALIUM) 5 MG tablet Take 5-15 mg by mouth daily as needed for anxiety.   Yes [provider]  DULoxetine (CYMBALTA) 60 MG capsule Take 60 mg by mouth daily. 12/25/13  Yes [provider]  Homeopathic Products (LEG CRAMP RELIEF) SUBL Place 1 tablet under the tongue daily as needed (leg cramps). [employer]   Yes [provider]  hydrochlorothiazide (HYDRODIURIL) 25 MG tablet Take 25 mg by mouth daily.  09/26/17  Yes [provider]  HYDROcodone-acetaminophen (NORCO) 7.5-325 MG tablet  Take 1 tablet by mouth as needed. 10/12/18  Yes [provider]  Magnesium Bisglycinate (MAG GLYCINATE) 100 MG TABS Take 300 mg by mouth at bedtime.   Yes [provider]  predniSONE (STERAPRED UNI-PAK 21 TAB) 5 MG (21) TBPK tablet Take 6,5,4,3,2,1 one tablet less each day, take with food 08/29/22  Yes Eldred Manges, MD  zolpidem (AMBIEN) 10 MG tablet Take 1 tablet (10 mg total) by mouth at bedtime as needed for sleep. 08/24/22  Yes Georgina Quint, MD    Allergies  Allergen Reactions   Amlodipine Other (See Comments)    Edema   Rosuvastatin Rash   Sulfa Antibiotics Other (See Comments)    Unknown    Wellbutrin [Bupropion] Other (See Comments)    Hallucinations   Lisinopril Cough    Patient Active Problem List   Diagnosis Date Noted   Cubital tunnel syndrome on left 08/29/2022   Chronic pain syndrome 08/09/2022   Current smoker 08/09/2022   Lung nodule 06/09/2022   Other fatigue 12/21/2021   DOE (dyspnea on exertion) 12/21/2021   Positive urine drug screen 06/26/2020   Prediabetes 06/19/2020   CKD (chronic kidney disease) stage 3, GFR 30-59 ml/min 06/19/2020   Severe episode of recurrent major depressive disorder, without psychotic features 06/18/2020   Irritable bowel syndrome with diarrhea 06/17/2020   Late effects of poliomyelitis 06/17/2020   Chronic obstructive pulmonary disease, unspecified 06/17/2020   Chronic insomnia 03/24/2020   Proteinuria 03/18/2020  Essential hypertension 03/03/2020   Gastroesophageal reflux disease without esophagitis 03/03/2020   Hyperlipidemia LDL goal <130 03/03/2020   Smokes 2 packs of cigarettes per day 05/02/2017   Pharyngoesophageal dysphagia 05/02/2017   Laryngopharyngeal reflux (LPR) 05/02/2017   Hoarseness 05/02/2017   Chronic sciatica of left side 04/01/2015   HNP (herniated nucleus pulposus), lumbar 02/21/2014   Synovial cyst of lumbar spine 02/21/2014   Lumbar foraminal stenosis 02/21/2014    Past Medical  History:  Diagnosis Date   Anxiety    Atherosclerosis    Bronchitis due to tobacco use    quit smoking 3 months ago   Chronic kidney disease    stage 3a, patient has kidney cyst   Chronic sciatica of left side 04/01/2015   COPD (chronic obstructive pulmonary disease)    Depression    Dyspnea    occasional   GERD (gastroesophageal reflux disease)    no current problems and no meds   Hypertension    Mitral valve prolapse    years ago   Pneumonia    years ago   Polio    age 55 years old   Pre-diabetes    no meds    Past Surgical History:  Procedure Laterality Date   ABDOMINAL HYSTERECTOMY  1985   BACK SURGERY     BRONCHIAL BIOPSY  06/28/2022   Procedure: BRONCHIAL BIOPSIES;  Surgeon: Josephine Igo, DO;  Location: [facility];  Service: Pulmonary;;   BRONCHIAL BRUSHINGS  06/28/2022   Procedure: BRONCHIAL BRUSHINGS;  Surgeon: Josephine Igo, DO;  Location: [facility];  Service: Pulmonary;;   COLONOSCOPY  2017   EYE SURGERY     cataracts removed bilateral   left leg surgery Left    LUMBAR LAMINECTOMY Left 02/21/2014   Procedure: Left L5-S1 Hemilaminectomy, Lateral Recess Decompression, Removal Synovial Cyst;  Surgeon: Eldred Manges, MD;  Location: [facility] OR;  Service: Orthopedics;  Laterality: Left;   MICROLARYNGOSCOPY N/A 10/11/2018   Procedure: MICROLARYNGOSCOPY with Biopsy;  Surgeon: Serena Colonel, MD;  [facility];  Service: ENT;  Laterality: N/A;   polio Bilateral 1950's   surgery on feet and legs for polio   TONSILLECTOMY      Social History   Socioeconomic History   Marital status: Married    Spouse name: [name]   Number of children: 1   Years of education: [employer] education level: Not on file  Occupational History   Occupation: Retired    Associate Professor: [employer]  Tobacco Use   Smoking status: Every Day    Packs/day: 1.50    Years: 30.00    Additional pack years: 0.00    Total pack years: 45.00    Types: Cigarettes   Smokeless tobacco: Never    Tobacco comments:    currently smokes a half of pack of ciggs daily. As of 07/05/2022 LW  Vaping Use   Vaping Use: Never used  Substance and Sexual Activity   Alcohol use: Not Currently    Comment: occasional   Drug use: No   Sexual activity: Not Currently    Birth control/protection: Surgical    Comment: Hysterectomy  Other Topics Concern   Not on file  Social History Narrative   Patient lives at home with her spouse.   Caffeine Use: daily   Social Determinants of Health   Financial Resource Strain: Not on file  Food Insecurity: Not on file  Transportation Needs: Not on file  Physical Activity: Not on file  Stress: Not on  file  Social Connections: Not on file  Intimate Partner Violence: Not on file    Family History  Problem Relation Age of Onset   Cancer Mother        thyroid   Heart attack Father    Cancer Sister        breast   Kidney disease Sister        ESRD   Irritable bowel syndrome Sister    Cancer Sister        thyroid   Colon cancer Neg Hx    Esophageal cancer Neg Hx      Review of Systems  Constitutional: Negative.  Negative for chills and fever.  HENT: Negative.  Negative for congestion and sore throat.   Respiratory: Negative.  Negative for cough.   Cardiovascular: Negative.  Negative for chest pain and palpitations.  Gastrointestinal:  Negative for abdominal pain, diarrhea, nausea and vomiting.  Musculoskeletal:  Positive for back pain and neck pain.  Skin: Negative.  Negative for rash.  Neurological: Negative.  Negative for dizziness and headaches.  All other systems reviewed and are negative.   Vitals:   09/13/22 1409  BP: 118/68  Pulse: 76  Temp: 98.2 F (36.8 C)  SpO2: 95%    Physical Exam Vitals reviewed.  Constitutional:      Appearance: Normal appearance.  HENT:     Head: Normocephalic.     Mouth/Throat:     Mouth: Mucous membranes are moist.     Pharynx: Oropharynx is clear.  Eyes:     Extraocular Movements:  Extraocular movements intact.     Conjunctiva/sclera: Conjunctivae normal.     Pupils: Pupils are equal, round, and reactive to light.  Cardiovascular:     Rate and Rhythm: Normal rate and regular rhythm.     Pulses: Normal pulses.     Heart sounds: Normal heart sounds.  Pulmonary:     Effort: Pulmonary effort is normal.     Breath sounds: Normal breath sounds.  Skin:    General: Skin is warm and dry.     Capillary Refill: Capillary refill takes less than 2 seconds.  Neurological:     General: No focal deficit present.     Mental Status: She is alert and oriented to person, place, and time.  Psychiatric:        Mood and Affect: Mood normal.        Behavior: Behavior normal.      ASSESSMENT & PLAN: A total of 33 minutes was spent with the patient and counseling/coordination of care regarding preparing for this visit, review of most recent office visit notes, review of most recent orthopedic office visit notes, review of chronic medical conditions under management, review of all medications, pain management, prognosis, documentation, and need for follow-up.  Problem List Items Addressed This Visit       Cardiovascular and Mediastinum   Essential hypertension    BP Readings from Last 3 Encounters:  09/13/22 118/68  08/29/22 (!) 143/71  08/09/22 122/72  Well-controlled hypertension Continue hydrochlorothiazide 25 mg         Other   Chronic pain syndrome   Relevant Orders   Urinalysis   CBC with Differential/Platelet (Completed)   Comprehensive metabolic panel (Completed)   Lumbosacral pain, chronic - Primary    Chronic pain affecting quality of life. Sees orthopedist on a regular basis.  Pain management handled by his office.      Relevant Orders   Urinalysis   CBC with  Differential/Platelet (Completed)   Comprehensive metabolic panel (Completed)   Patient Instructions  Chronic Back Pain Chronic back pain is back pain that lasts longer than 3 months. The pain  may get worse at certain times (flare-ups). There are things you can do at home to manage your pain. Follow these instructions at home: Watch for any changes in your symptoms. Take these actions to help with your pain: Managing pain and stiffness     If told, put ice on the painful area. You may be told to use ice for 24-48 hours after a flare-up starts. Put ice in a plastic bag. Place a towel between your skin and the bag. Leave the ice on for 20 minutes, 2-3 times a day. If told, put heat on the painful area. Do this as often as told by your doctor. Use the heat source that your doctor recommends, such as a moist heat pack or a heating pad. Place a towel between your skin and the heat source. Leave the heat on for 20-30 minutes. If your skin turns bright red, take off the ice or heat right away to prevent skin damage. The risk of damage is higher if you cannot feel pain, heat, or cold. Soak in a warm bath. This can help with pain. Activity        Avoid bending and other activities that make the pain worse. When you stand: Keep your upper back and neck straight. Keep your shoulders pulled back. Avoid slouching. When you sit: Keep your back straight. Relax your shoulders. Do not round your shoulders or pull them backward. Do not sit or stand in one place for too long. Take short rest breaks during the day. Lying down or standing is often better than sitting. Resting can help relieve pain. When sitting or lying down for a long time, do some mild activity or stretching. This will help to prevent stiffness and pain. Get regular exercise. Ask your doctor what activities are safe for you. You may have to avoid lifting. Ask your provider how much you can safely lift. If you lift things: Bend your knees. Keep the weight close to your body. Avoid twisting. Medicines Take over-the-counter and prescription medicines only as told by your doctor. You may need to take medicines for pain  and swelling. These may be taken by mouth or put on the skin. You may also be given muscle relaxants. Ask your doctor if the medicine prescribed to you: Requires you to avoid driving or using machinery. Can cause trouble pooping (constipation). You may need to take these actions to prevent or treat trouble pooping: Drink enough fluid to keep your pee (urine) pale yellow. Take over-the-counter or prescription medicines. Eat foods that are high in fiber. These include beans, whole grains, and fresh fruits and vegetables. Limit foods that are high in fat and sugars. These include fried or sweet foods. General instructions  Sleep on a firm mattress. Try lying on your side with your knees slightly bent. If you lie on your back, put a pillow under your knees. Do not smoke or use any products that contain nicotine or tobacco. If you need help quitting, ask your doctor. Contact a doctor if: Your pain does not get better with rest or medicine. You have new pain. You have a fever. You lose weight quickly. You have trouble doing your normal activities. One or both of your legs or feet feel weak. One or both of your legs or feet lose feeling (have numbness).  Get help right away if: You are not able to control when you pee or poop. You have bad back pain and: You feel like you may vomit (nauseous). You vomit. You have pain in your chest or your belly (abdomen). You have shortness of breath. You faint. These symptoms may be an emergency. Get help right away. Call 911. Do not wait to see if the symptoms will go away. Do not drive yourself to the hospital. This information is not intended to replace advice given to you by your health care provider. Make sure you discuss any questions you have with your health care provider. Document Revised: 12/27/2021 Document Reviewed: 12/27/2021 Elsevier Patient Education  2023 [employer]   Edwina Barth, MD [facility]

## 2022-09-13 NOTE — Assessment & Plan Note (Signed)
BP Readings from Last 3 Encounters:  09/13/22 118/68  08/29/22 (!) 143/71  08/09/22 122/72  Well-controlled hypertension Continue hydrochlorothiazide 25 mg

## 2022-09-13 NOTE — Assessment & Plan Note (Signed)
Chronic pain affecting quality of life. Sees orthopedist on a regular basis.  Pain management handled by his office.

## 2022-09-14 ENCOUNTER — Ambulatory Visit: Payer: Medicare HMO | Admitting: Physical Medicine and Rehabilitation

## 2022-09-14 DIAGNOSIS — M25522 Pain in left elbow: Secondary | ICD-10-CM

## 2022-09-14 DIAGNOSIS — M79602 Pain in left arm: Secondary | ICD-10-CM

## 2022-09-14 DIAGNOSIS — R202 Paresthesia of skin: Secondary | ICD-10-CM | POA: Diagnosis not present

## 2022-09-14 DIAGNOSIS — M542 Cervicalgia: Secondary | ICD-10-CM

## 2022-09-14 NOTE — Progress Notes (Signed)
Functional Pain Scale - descriptive words and definitions  Moderate (4)   Constantly aware of pain, can complete ADLs with modification/sleep marginally affected at times/passive distraction is of no use, but active distraction gives some relief. Moderate range order  Average Pain 6  Left handed. Pain in left arm from wrist to shoulder, but mainly in the left elbow

## 2022-09-21 ENCOUNTER — Encounter: Payer: Self-pay | Admitting: Physical Medicine and Rehabilitation

## 2022-09-21 NOTE — Progress Notes (Signed)
NYCOLE KAWAHARA - 77 y.o. female MRN [id number]  Date of birth: [birth year]  Office Visit Note: Visit Date: 09/14/2022 PCP: Georgina Quint, MD Referred by: Eldred Manges, MD  Subjective: Chief Complaint  Patient presents with   Left Elbow - Pain   Left Arm - Pain   HPI:  KRISTAL PERL is a 77 y.o. female who comes in today at the request of Dr. Annell Greening for evaluation and management of chronic, worsening and severe pain, numbness and tingling in the Left upper extremities.  Patient is Left hand dominant.  She reports chronic several month history of worsening mainly left elbow pain but with some pain from the wrist up to the elbow and through the arm.  No real symptoms from the neck down although she does have neck pain in general.  Denies any right-sided complaints.  She has not had any focal weakness but has felt some weakness.  Has mostly pain more than tingling or numbness.  Dr. Ophelia Charter and wondered about ulnar neuropathy although today she has not really given the indication is into the fifth digit etc.  It is more on the ulnar side of the wrist however.  No specific trauma.  No prior electrodiagnostic studies.  She carries a diagnosis of chronic pain syndrome but without history of fibromyalgia specifically.  She is prediabetic.   I spent more than 30 minutes speaking face-to-face with the patient with 50% of the time in counseling and discussing coordination of care.     Review of Systems  Musculoskeletal:  Positive for back pain, falls and joint pain.  All other systems reviewed and are negative.  Otherwise per HPI.  Assessment & Plan: Visit Diagnoses:    ICD-10-CM   1. Paresthesia of skin  R20.2 NCV with EMG (electromyography)    2. Pain in left elbow  M25.522     3. Left arm pain  M79.602     4. Cervicalgia  M54.2       Plan: Impression: The above electrodiagnostic study is ABNORMAL and reveals evidence of a mild left median nerve entrapment at the wrist   affecting sensory components. There is no significant electrodiagnostic evidence of any other focal nerve entrapment, brachial plexopathy or cervical radiculopathy.    As you know, this particular electrodiagnostic study cannot rule out chemical radiculitis or sensory only radiculopathy.   **This electrodiagnostic study cannot rule out small fiber polyneuropathy and dysesthesias from central pain syndromes such as stroke or central pain sensitization syndromes such as fibromyalgia.  Myotomal referral pain from trigger points is also not excluded.  Recommendations: 1.  Follow-up with referring physician.  Careful clinical correlation with symptoms. 2.  Continue current management of symptoms. 3.  Continue use of resting splint at night-time and as needed during the day.  Meds & Orders: No orders of the defined types were placed in this encounter.   Orders Placed This Encounter  Procedures   NCV with EMG (electromyography)    Follow-up: Return for Annell Greening, MD.   Procedures: No procedures performed  EMG & NCV Findings: Evaluation of the left median (across palm) sensory nerve showed no response (Palm) and prolonged distal peak latency (4.7 ms).  The left ulnar sensory nerve showed prolonged distal peak latency (4.2 ms) and decreased conduction velocity (Wrist-5th Digit, 33 m/s).  All remaining nerves (as indicated in the following tables) were within normal limits.    All examined muscles (as indicated in the following table) showed no  evidence of electrical instability.    Impression: The above electrodiagnostic study is ABNORMAL and reveals evidence of a mild left median nerve entrapment at the wrist  affecting sensory components. There is no significant electrodiagnostic evidence of any other focal nerve entrapment, brachial plexopathy or cervical radiculopathy.    As you know, this particular electrodiagnostic study cannot rule out chemical radiculitis or sensory only  radiculopathy.   **This electrodiagnostic study cannot rule out small fiber polyneuropathy and dysesthesias from central pain syndromes such as stroke or central pain sensitization syndromes such as fibromyalgia.  Myotomal referral pain from trigger points is also not excluded.  Recommendations: 1.  Follow-up with referring physician.  Careful clinical correlation with symptoms. 2.  Continue current management of symptoms. 3.  Continue use of resting splint at night-time and as needed during the day.  ___________________________ Elease Hashimoto Board Certified, American Board of Physical Medicine and Rehabilitation    Nerve Conduction Studies Anti Sensory Summary Table   Stim Site NR Peak (ms) Norm Peak (ms) P-T Amp (V) Norm P-T Amp Site1 Site2 Delta-P (ms) Dist (cm) Vel (m/s) Norm Vel (m/s)  Left Median Acr Palm Anti Sensory (2nd Digit)  27.9C  Wrist    *4.7 <3.6 21.0 >10 Wrist Palm  0.0    Palm *NR  <2.0          Left Radial Anti Sensory (Base 1st Digit)  28.6C  Wrist    2.6 <3.1 12.5  Wrist Base 1st Digit 2.6 0.0    Left Ulnar Anti Sensory (5th Digit)  28.8C  Wrist    *4.2 <3.7 21.8 >15.0 Wrist 5th Digit 4.2 14.0 *33 >38   Motor Summary Table   Stim Site NR Onset (ms) Norm Onset (ms) O-P Amp (mV) Norm O-P Amp Site1 Site2 Delta-0 (ms) Dist (cm) Vel (m/s) Norm Vel (m/s)  Left Median Motor (Abd Poll Brev)  28.5C  Wrist    4.1 <4.2 7.9 >5 Elbow Wrist 4.2 22.0 52 >50  Elbow    8.3  8.0         Left Ulnar Motor (Abd Dig Min)  29.4C  Wrist    3.4 <4.2 9.5 >3 B Elbow Wrist 3.6 20.0 56 >53  B Elbow    7.0  10.3  A Elbow B Elbow 1.7 9.0 53 >53  A Elbow    8.7  9.8          EMG   Side Muscle Nerve Root Ins Act Fibs Psw Amp Dur Poly Recrt Int Dennie Bible Comment  Left Abd Poll Brev Median C8-T1 Nml Nml Nml Nml Nml 0 Nml Nml   Left 1stDorInt Ulnar C8-T1 Nml Nml Nml Nml Nml 0 Nml Nml   Left PronatorTeres Median C6-7 Nml Nml Nml Nml Nml 0 Nml Nml   Left Biceps Musculocut C5-6 Nml Nml  Nml Nml Nml 0 Nml Nml   Left Deltoid Axillary C5-6 Nml Nml Nml Nml Nml 0 Nml Nml     Nerve Conduction Studies Anti Sensory Left/Right Comparison   Stim Site L Lat (ms) R Lat (ms) L-R Lat (ms) L Amp (V) R Amp (V) L-R Amp (%) Site1 Site2 L Vel (m/s) R Vel (m/s) L-R Vel (m/s)  Median Acr Palm Anti Sensory (2nd Digit)  27.9C  Wrist *4.7   21.0   Wrist Palm     Palm             Radial Anti Sensory (Base 1st Digit)  28.6C  Wrist 2.6  12.5   Wrist Base 1st Digit     Ulnar Anti Sensory (5th Digit)  28.8C  Wrist *4.2   21.8   Wrist 5th Digit *33     Motor Left/Right Comparison   Stim Site L Lat (ms) R Lat (ms) L-R Lat (ms) L Amp (mV) R Amp (mV) L-R Amp (%) Site1 Site2 L Vel (m/s) R Vel (m/s) L-R Vel (m/s)  Median Motor (Abd Poll Brev)  28.5C  Wrist 4.1   7.9   Elbow Wrist 52    Elbow 8.3   8.0         Ulnar Motor (Abd Dig Min)  29.4C  Wrist 3.4   9.5   B Elbow Wrist 56    B Elbow 7.0   10.3   A Elbow B Elbow 53    A Elbow 8.7   9.8            Waveforms:             Clinical History: MRI LUMBAR SPINE WITHOUT CONTRAST     TECHNIQUE:  Multiplanar, multisequence MR imaging of the lumbar spine was  performed. No intravenous contrast was administered.     COMPARISON:  Lumbar MRI 05/08/2018     FINDINGS:  Segmentation:  Normal     Alignment:  Mild retrolisthesis L1-2, L2-3, L3-4.     Vertebrae:  Normal bone marrow.  Negative for fracture or mass.     Conus medullaris and cauda equina: Conus extends to the L2 level.  Conus and cauda equina appear normal.     Paraspinal and other soft tissues: Negative for paraspinous mass or  adenopathy     Disc levels:     T11-12: Diffuse disc bulging with mild spinal stenosis     T12-L1: Negative     L1-2: Moderate disc degeneration with disc bulging and endplate  spurring. Mild facet degeneration. Small left-sided disc protrusion  with mild left subarticular stenosis. No interval change.     L2-3: Moderate to advanced  disc degeneration with disc space  narrowing, disc bulging and diffuse endplate spurring. Moderate  facet hypertrophy. Mild spinal stenosis and mild subarticular  stenosis bilaterally unchanged.     L3-4: Diffuse disc bulging and moderate facet hypertrophy. Moderate  spinal stenosis and moderate subarticular stenosis bilaterally. No  interval change.     L4-5: Disc degeneration with diffuse disc bulging. Small left  paracentral disc protrusion is unchanged. Bilateral facet  hypertrophy. Moderate subarticular stenosis bilaterally appears  unchanged.     L5-S1: Left laminotomy is chronic and unchanged. Disc degeneration  with diffuse endplate spurring. Moderate subarticular and foraminal  stenosis on the right. Moderate to severe subarticular and foraminal  stenosis on the left due to spurring is unchanged.     IMPRESSION:  Advanced multilevel disc and facet degeneration throughout the  lumbar spine causing spinal and foraminal stenosis as described  above.     Prior laminectomy left L5-S1. Moderate to severe subarticular and  foraminal stenosis on the left at L5-S1 due to spurring is  unchanged. Left paracentral disc protrusion L4-5 unchanged.     No significant change from the prior MRI 2019.        Electronically Signed    By: Marlan Palau M.D.    On: 09/18/2019 11:05     Objective:  VS:  HT:    WT:   BMI:     BP:   HR: bpm  TEMP: ( )  RESP:  Physical  Exam Vitals and nursing note reviewed.  Constitutional:      General: She is not in acute distress.    Appearance: Normal appearance. She is well-developed. She is not ill-appearing.  HENT:     Head: Normocephalic and atraumatic.  Eyes:     Conjunctiva/sclera: Conjunctivae normal.     Pupils: Pupils are equal, round, and reactive to light.  Cardiovascular:     Rate and Rhythm: Normal rate.     Pulses: Normal pulses.  Pulmonary:     Effort: Pulmonary effort is normal.  Musculoskeletal:        General: No  swelling, tenderness or deformity.     Right lower leg: No edema.     Left lower leg: No edema.     Comments: Inspection reveals no atrophy of the bilateral APB or FDI or hand intrinsics. There is no swelling, color changes, allodynia or dystrophic changes. There is 5 out of 5 strength in the bilateral wrist extension, finger abduction and long finger flexion. There is intact sensation to light touch in all dermatomal and peripheral nerve distributions. There is a negative Froment's test bilaterally. There is a negative Tinel's test at the bilateral wrist and elbow. There is a negative Phalen's test bilaterally. There is a negative Hoffmann's test bilaterally.  Skin:    General: Skin is warm and dry.     Findings: No erythema or rash.  Neurological:     General: No focal deficit present.     Mental Status: She is alert and oriented to person, place, and time.     Sensory: No sensory deficit.     Motor: No weakness or abnormal muscle tone.     Coordination: Coordination normal.     Gait: Gait normal.  Psychiatric:        Mood and Affect: Mood normal.        Behavior: Behavior normal.      Imaging: No results found.

## 2022-09-21 NOTE — Procedures (Signed)
EMG & NCV Findings: Evaluation of the left median (across palm) sensory nerve showed no response (Palm) and prolonged distal peak latency (4.7 ms).  The left ulnar sensory nerve showed prolonged distal peak latency (4.2 ms) and decreased conduction velocity (Wrist-5th Digit, 33 m/s).  All remaining nerves (as indicated in the following tables) were within normal limits.    All examined muscles (as indicated in the following table) showed no evidence of electrical instability.    Impression: The above electrodiagnostic study is ABNORMAL and reveals evidence of a mild left median nerve entrapment at the wrist  affecting sensory components. There is no significant electrodiagnostic evidence of any other focal nerve entrapment, brachial plexopathy or cervical radiculopathy.    As you know, this particular electrodiagnostic study cannot rule out chemical radiculitis or sensory only radiculopathy.   **This electrodiagnostic study cannot rule out small fiber polyneuropathy and dysesthesias from central pain syndromes such as stroke or central pain sensitization syndromes such as fibromyalgia.  Myotomal referral pain from trigger points is also not excluded.  Recommendations: 1.  Follow-up with referring physician.  Careful clinical correlation with symptoms. 2.  Continue current management of symptoms. 3.  Continue use of resting splint at night-time and as needed during the day.  ___________________________ Elease Hashimoto Board Certified, American Board of Physical Medicine and Rehabilitation    Nerve Conduction Studies Anti Sensory Summary Table   Stim Site NR Peak (ms) Norm Peak (ms) P-T Amp (V) Norm P-T Amp Site1 Site2 Delta-P (ms) Dist (cm) Vel (m/s) Norm Vel (m/s)  Left Median Acr Palm Anti Sensory (2nd Digit)  27.9C  Wrist    *4.7 <3.6 21.0 >10 Wrist Palm  0.0    Palm *NR  <2.0          Left Radial Anti Sensory (Base 1st Digit)  28.6C  Wrist    2.6 <3.1 12.5  Wrist Base 1st  Digit 2.6 0.0    Left Ulnar Anti Sensory (5th Digit)  28.8C  Wrist    *4.2 <3.7 21.8 >15.0 Wrist 5th Digit 4.2 14.0 *33 >38   Motor Summary Table   Stim Site NR Onset (ms) Norm Onset (ms) O-P Amp (mV) Norm O-P Amp Site1 Site2 Delta-0 (ms) Dist (cm) Vel (m/s) Norm Vel (m/s)  Left Median Motor (Abd Poll Brev)  28.5C  Wrist    4.1 <4.2 7.9 >5 Elbow Wrist 4.2 22.0 52 >50  Elbow    8.3  8.0         Left Ulnar Motor (Abd Dig Min)  29.4C  Wrist    3.4 <4.2 9.5 >3 B Elbow Wrist 3.6 20.0 56 >53  B Elbow    7.0  10.3  A Elbow B Elbow 1.7 9.0 53 >53  A Elbow    8.7  9.8          EMG   Side Muscle Nerve Root Ins Act Fibs Psw Amp Dur Poly Recrt Int Dennie Bible Comment  Left Abd Poll Brev Median C8-T1 Nml Nml Nml Nml Nml 0 Nml Nml   Left 1stDorInt Ulnar C8-T1 Nml Nml Nml Nml Nml 0 Nml Nml   Left PronatorTeres Median C6-7 Nml Nml Nml Nml Nml 0 Nml Nml   Left Biceps Musculocut C5-6 Nml Nml Nml Nml Nml 0 Nml Nml   Left Deltoid Axillary C5-6 Nml Nml Nml Nml Nml 0 Nml Nml     Nerve Conduction Studies Anti Sensory Left/Right Comparison   Stim Site L Lat (ms) R Lat (ms)  L-R Lat (ms) L Amp (V) R Amp (V) L-R Amp (%) Site1 Site2 L Vel (m/s) R Vel (m/s) L-R Vel (m/s)  Median Acr Palm Anti Sensory (2nd Digit)  27.9C  Wrist *4.7   21.0   Wrist Palm     Palm             Radial Anti Sensory (Base 1st Digit)  28.6C  Wrist 2.6   12.5   Wrist Base 1st Digit     Ulnar Anti Sensory (5th Digit)  28.8C  Wrist *4.2   21.8   Wrist 5th Digit *33     Motor Left/Right Comparison   Stim Site L Lat (ms) R Lat (ms) L-R Lat (ms) L Amp (mV) R Amp (mV) L-R Amp (%) Site1 Site2 L Vel (m/s) R Vel (m/s) L-R Vel (m/s)  Median Motor (Abd Poll Brev)  28.5C  Wrist 4.1   7.9   Elbow Wrist 52    Elbow 8.3   8.0         Ulnar Motor (Abd Dig Min)  29.4C  Wrist 3.4   9.5   B Elbow Wrist 56    B Elbow 7.0   10.3   A Elbow B Elbow 53    A Elbow 8.7   9.8            Waveforms:

## 2022-09-22 ENCOUNTER — Other Ambulatory Visit: Payer: Self-pay

## 2022-09-22 ENCOUNTER — Ambulatory Visit: Payer: Medicare HMO | Admitting: Physical Medicine and Rehabilitation

## 2022-09-22 VITALS — BP 135/78 | HR 85

## 2022-09-22 DIAGNOSIS — M5416 Radiculopathy, lumbar region: Secondary | ICD-10-CM | POA: Diagnosis not present

## 2022-09-22 MED ORDER — METHYLPREDNISOLONE ACETATE 80 MG/ML IJ SUSP
80.0000 mg | Freq: Once | INTRAMUSCULAR | Status: AC
  Administered 2022-09-22: 80 mg

## 2022-09-22 NOTE — Progress Notes (Signed)
Functional Pain Scale - descriptive words and definitions  Distracting (5)    Aware of pain/able to complete some ADL's but limited by pain/sleep is affected and active distractions are only slightly useful. Moderate range order  Average Pain 4   +Driver, -BT, -Dye Allergies.  Lower back pain on left

## 2022-09-22 NOTE — Patient Instructions (Signed)

## 2022-09-23 ENCOUNTER — Other Ambulatory Visit: Payer: Self-pay | Admitting: *Deleted

## 2022-09-23 NOTE — Telephone Encounter (Signed)
Patient requesting a refill of her Duloxetine, Previous rx was sent in by another provider . Advise on refill

## 2022-09-24 MED ORDER — DULOXETINE HCL 60 MG PO CPEP: 60.0000 mg | ORAL_CAPSULE | Freq: Every day | ORAL | 1 refills | Status: DC

## 2022-10-03 ENCOUNTER — Ambulatory Visit
Admission: RE | Admit: 2022-10-03 | Discharge: 2022-10-03 | Disposition: A | Discharge status: Home / Self Care | Payer: Medicare HMO | Source: Ambulatory Visit | Attending: Acute Care | Admitting: Acute Care

## 2022-10-03 DIAGNOSIS — R911 Solitary pulmonary nodule: Secondary | ICD-10-CM

## 2022-10-04 NOTE — Procedures (Signed)
Lumbosacral Transforaminal Epidural Steroid Injection - Sub-Pedicular Approach with Fluoroscopic Guidance  Patient: [name]      Date of Birth: 30-Dec-1945 MRN: [id number] PCP: Georgina Quint, MD      Visit Date: 09/22/2022   Universal Protocol:    Date/Time: 09/22/2022  Consent Given By: the patient  Position: PRONE  Additional Comments: Vital signs were monitored before and after the procedure. Patient was prepped and draped in the usual sterile fashion. The correct patient, procedure, and site was verified.   Injection Procedure Details:   Procedure diagnoses: Lumbar radiculopathy [M54.16]    Meds Administered:  Meds ordered this encounter  Medications   methylPREDNISolone acetate (DEPO-MEDROL) injection 80 mg    Laterality: Left  Location/Site: L5  Needle:5.0 in., 22 ga.  Short bevel or Quincke spinal needle  Needle Placement: Transforaminal  Findings:    -Comments: Excellent flow of contrast along the nerve, nerve root and into the epidural space.  Procedure Details: After squaring off the end-plates to get a true AP view, the C-arm was positioned so that an oblique view of the foramen as noted above was visualized. The target area is just inferior to the "nose of the scotty dog" or sub pedicular. The soft tissues overlying this structure were infiltrated with 2-3 ml. of 1% Lidocaine without Epinephrine.  The spinal needle was inserted toward the target using a "trajectory" view along the fluoroscope beam.  Under AP and lateral visualization, the needle was advanced so it did not puncture dura and was located close the 6 O'Clock position of the pedical in AP tracterory. Biplanar projections were used to confirm position. Aspiration was confirmed to be negative for CSF and/or blood. A 1-2 ml. volume of Isovue-250 was injected and flow of contrast was noted at each level. Radiographs were obtained for documentation purposes.   After attaining the desired  flow of contrast documented above, a 0.5 to 1.0 ml test dose of 0.25% Marcaine was injected into each respective transforaminal space.  The patient was observed for 90 seconds post injection.  After no sensory deficits were reported, and normal lower extremity motor function was noted,   the above injectate was administered so that equal amounts of the injectate were placed at each foramen (level) into the transforaminal epidural space.   Additional Comments:  No complications occurred Dressing: 2 x 2 sterile gauze and Band-Aid    Post-procedure details: Patient was observed during the procedure. Post-procedure instructions were reviewed.  Patient left the clinic in stable condition.

## 2022-10-04 NOTE — Progress Notes (Signed)
[name] - 77 y.o. female MRN [id number]  Date of birth: 23-Jan-1946  Office Visit Note: Visit Date: 09/22/2022 PCP: Georgina Quint, MD Referred by: Georgina Quint, *  Subjective: Chief Complaint  Patient presents with   Lower Back - Pain   HPI:  [name] is a 77 y.o. female who comes in today at the request of Dr. Annell Greening for planned Left L5-S1 Lumbar Transforaminal epidural steroid injection with fluoroscopic guidance.  The patient has failed conservative care including home exercise, medications, time and activity modification.  This injection will be diagnostic and hopefully therapeutic.  Please see requesting physician notes for further details and justification.   ROS Otherwise per HPI.  Assessment & Plan: Visit Diagnoses:    ICD-10-CM   1. Lumbar radiculopathy  M54.16 XR C-ARM NO REPORT    Epidural Steroid injection    methylPREDNISolone acetate (DEPO-MEDROL) injection 80 mg      Plan: No additional findings.   Meds & Orders:  Meds ordered this encounter  Medications   methylPREDNISolone acetate (DEPO-MEDROL) injection 80 mg    Orders Placed This Encounter  Procedures   XR C-ARM NO REPORT   Epidural Steroid injection    Follow-up: Return for visit to requesting provider as needed.   Procedures: No procedures performed  Lumbosacral Transforaminal Epidural Steroid Injection - Sub-Pedicular Approach with Fluoroscopic Guidance  Patient: [name]      Date of Birth: 1945/08/21 MRN: [id number] PCP: Georgina Quint, MD      Visit Date: 09/22/2022   Universal Protocol:    Date/Time: 09/22/2022  Consent Given By: the patient  Position: PRONE  Additional Comments: Vital signs were monitored before and after the procedure. Patient was prepped and draped in the usual sterile fashion. The correct patient, procedure, and site was verified.   Injection Procedure Details:   Procedure diagnoses: Lumbar radiculopathy [M54.16]     Meds Administered:  Meds ordered this encounter  Medications   methylPREDNISolone acetate (DEPO-MEDROL) injection 80 mg    Laterality: Left  Location/Site: L5  Needle:5.0 in., 22 ga.  Short bevel or Quincke spinal needle  Needle Placement: Transforaminal  Findings:    -Comments: Excellent flow of contrast along the nerve, nerve root and into the epidural space.  Procedure Details: After squaring off the end-plates to get a true AP view, the C-arm was positioned so that an oblique view of the foramen as noted above was visualized. The target area is just inferior to the "nose of the scotty dog" or sub pedicular. The soft tissues overlying this structure were infiltrated with 2-3 ml. of 1% Lidocaine without Epinephrine.  The spinal needle was inserted toward the target using a "trajectory" view along the fluoroscope beam.  Under AP and lateral visualization, the needle was advanced so it did not puncture dura and was located close the 6 O'Clock position of the pedical in AP tracterory. Biplanar projections were used to confirm position. Aspiration was confirmed to be negative for CSF and/or blood. A 1-2 ml. volume of Isovue-250 was injected and flow of contrast was noted at each level. Radiographs were obtained for documentation purposes.   After attaining the desired flow of contrast documented above, a 0.5 to 1.0 ml test dose of 0.25% Marcaine was injected into each respective transforaminal space.  The patient was observed for 90 seconds post injection.  After no sensory deficits were reported, and normal lower extremity motor function was noted,   the above injectate  was administered so that equal amounts of the injectate were placed at each foramen (level) into the transforaminal epidural space.   Additional Comments:  No complications occurred Dressing: 2 x 2 sterile gauze and Band-Aid    Post-procedure details: Patient was observed during the procedure. Post-procedure  instructions were reviewed.  Patient left the clinic in stable condition.    Clinical History: MRI LUMBAR SPINE WITHOUT CONTRAST     TECHNIQUE:  Multiplanar, multisequence MR imaging of the lumbar spine was  performed. No intravenous contrast was administered.     COMPARISON:  Lumbar MRI 05/08/2018     FINDINGS:  Segmentation:  Normal     Alignment:  Mild retrolisthesis L1-2, L2-3, L3-4.     Vertebrae:  Normal bone marrow.  Negative for fracture or mass.     Conus medullaris and cauda equina: Conus extends to the L2 level.  Conus and cauda equina appear normal.     Paraspinal and other soft tissues: Negative for paraspinous mass or  adenopathy     Disc levels:     T11-12: Diffuse disc bulging with mild spinal stenosis     T12-L1: Negative     L1-2: Moderate disc degeneration with disc bulging and endplate  spurring. Mild facet degeneration. Small left-sided disc protrusion  with mild left subarticular stenosis. No interval change.     L2-3: Moderate to advanced disc degeneration with disc space  narrowing, disc bulging and diffuse endplate spurring. Moderate  facet hypertrophy. Mild spinal stenosis and mild subarticular  stenosis bilaterally unchanged.     L3-4: Diffuse disc bulging and moderate facet hypertrophy. Moderate  spinal stenosis and moderate subarticular stenosis bilaterally. No  interval change.     L4-5: Disc degeneration with diffuse disc bulging. Small left  paracentral disc protrusion is unchanged. Bilateral facet  hypertrophy. Moderate subarticular stenosis bilaterally appears  unchanged.     L5-S1: Left laminotomy is chronic and unchanged. Disc degeneration  with diffuse endplate spurring. Moderate subarticular and foraminal  stenosis on the right. Moderate to severe subarticular and foraminal  stenosis on the left due to spurring is unchanged.     IMPRESSION:  Advanced multilevel disc and facet degeneration throughout the  lumbar spine  causing spinal and foraminal stenosis as described  above.     Prior laminectomy left L5-S1. Moderate to severe subarticular and  foraminal stenosis on the left at L5-S1 due to spurring is  unchanged. Left paracentral disc protrusion L4-5 unchanged.     No significant change from the prior MRI 2019.        Electronically Signed    By: Marlan Palau M.D.    On: 09/18/2019 11:05     Objective:  VS:  HT:    WT:   BMI:     BP:135/78  HR:85bpm  TEMP: ( )  RESP:  Physical Exam Vitals and nursing note reviewed.  Constitutional:      General: She is not in acute distress.    Appearance: Normal appearance. She is not ill-appearing.  HENT:     Head: Normocephalic and atraumatic.     Right Ear: External ear normal.     Left Ear: External ear normal.  Eyes:     Extraocular Movements: Extraocular movements intact.  Cardiovascular:     Rate and Rhythm: Normal rate.     Pulses: Normal pulses.  Pulmonary:     Effort: Pulmonary effort is normal. No respiratory distress.  Abdominal:     General: There is no distension.  Palpations: Abdomen is soft.  Musculoskeletal:        General: Tenderness present.     Cervical back: Neck supple.     Right lower leg: No edema.     Left lower leg: No edema.     Comments: Patient has good distal strength with no pain over the greater trochanters.  No clonus or focal weakness.  Skin:    Findings: No erythema, lesion or rash.  Neurological:     General: No focal deficit present.     Mental Status: She is alert and oriented to person, place, and time.     Sensory: No sensory deficit.     Motor: No weakness or abnormal muscle tone.     Coordination: Coordination normal.  Psychiatric:        Mood and Affect: Mood normal.        Behavior: Behavior normal.      Imaging: No results found.

## 2022-10-07 ENCOUNTER — Ambulatory Visit: Payer: Medicare HMO | Admitting: Acute Care

## 2022-10-11 ENCOUNTER — Ambulatory Visit: Payer: Medicare HMO | Admitting: Acute Care

## 2022-10-11 ENCOUNTER — Ambulatory Visit (INDEPENDENT_AMBULATORY_CARE_PROVIDER_SITE_OTHER): Payer: Medicare HMO

## 2022-10-11 ENCOUNTER — Encounter: Payer: Self-pay | Admitting: Acute Care

## 2022-10-11 VITALS — BP 132/84 | HR 78 | Ht 62.0 in | Wt 166.0 lb

## 2022-10-11 VITALS — BP 152/84 | HR 76 | Temp 98.0°F | Ht 62.0 in | Wt 166.8 lb

## 2022-10-11 DIAGNOSIS — Z0000 Encounter for general adult medical examination without abnormal findings: Secondary | ICD-10-CM | POA: Diagnosis not present

## 2022-10-11 DIAGNOSIS — F17200 Nicotine dependence, unspecified, uncomplicated: Secondary | ICD-10-CM | POA: Diagnosis not present

## 2022-10-11 DIAGNOSIS — R911 Solitary pulmonary nodule: Secondary | ICD-10-CM

## 2022-10-11 DIAGNOSIS — R942 Abnormal results of pulmonary function studies: Secondary | ICD-10-CM | POA: Diagnosis not present

## 2022-10-11 DIAGNOSIS — R9389 Abnormal findings on diagnostic imaging of other specified body structures: Secondary | ICD-10-CM

## 2022-10-11 NOTE — Patient Instructions (Addendum)
It is good to see you today. The lung nodules we are following are similar in size as they were 06/2022, but there is also a new 6 mm nodule that we will need to follow. We will do a 3 month follow up CT Chest.  You will get a call to schedule this closer to the time . Please contact office for sooner follow up if symptoms do not improve or worsen or seek emergency care   Call us if you need [place] .

## 2022-10-11 NOTE — Progress Notes (Signed)
History of Present Illness [name] is a 77 y.o. female  current every day smoker with abnormal findings on Lung Cancer Screening Scan 05/11/2022.She is followed by Dr. Tonia Brooms and the [facility].  Synopsis 77 y.o. female  current every day smoker with abnormal findings on Lung Cancer Screening Scan 05/11/2022.She is followed by Dr. Tonia Brooms and the [facility]. Follow up PET scan was concerning for Hypermetabolic ground-glass nodule in the LEFT upper lobe >> read as concerning for bronchogenic carcinoma.She underwent a Flexible video fiberoptic bronchoscopy with robotic assistance and biopsies on 06/28/2022. Biopsies were negative for malignancy. Plan was for a 3 month follow up CT to re-assess for stability of nodules.   10/11/2022 Pt. Presents for follow up after repeat CT Chest, 3 months after bronch which was negative for malignancy. . CT Chest shows similar size nodules as 06/2022 , and a new 6 mm nodule. She is still smoking. Results of CT Chest are noted below.  The nodules are similar to previous imaging, but remain concerning for 1 or more indolent primary bronchogenic carcinomas or an infectious/inflammatory etiology, including organizing pneumonia. There is a new basilar 6 mm left lung nodular density is new since the prior exam, follow up is recommended. Radiology recommends repeat tissue sampling or follow up CT Chest. Plan is for 3 month follow up scan, and follow up to review. Patient is in agreement with this plan.  She states she is doing well, no breathing issues.   Test Results: Cardiovascular: Aortic atherosclerosis. Aberrant right subclavian artery traversing posterior to the esophagus. Normal heart size, without pericardial effusion.   Mediastinum/Nodes: No mediastinal or hilar adenopathy, given limitations of unenhanced CT.   Lungs/Pleura: No pleural fluid. Moderate centrilobular and paraseptal emphysema.   Mild biapical  pleuroparenchymal scarring.   Posterolateral right upper lobe 11 mm density on 26/3 is similar, favored to be related to pleuroparenchymal scarring.   Calcified granuloma along the right minor fissure of 9 mm.   Pleural-based superior segment right lower lobe 7 mm nodule on 74/3 is similar.   Irregular left apical density of 1.4 cm on 23/3 is similar and favored to represent scarring.   Left upper lobe pleural-based soft tissue density measures 2.1 x 1.0 cm on 51/3 and is similar to the prior exam.   More ill-defined nodularity just caudal to this is also pleural-based at 9 mm on 56/3, similar.   Just medial to this, a left upper lobe nodular density measures 7 mm on 50/3 and is similar to on the prior exam (when remeasured).   Pleural-based nodule in the left costophrenic angle measures 6 mm on 104/3 and is new since the prior.   More posteromedial vague 5 mm nodule on 76/3 is unchanged.   Upper Abdomen: Normal imaged portions of the liver, spleen, stomach, pancreas, adrenal glands, right kidney. Upper pole left renal 5.4 cm fluid density lesion is likely a cyst . In the absence of clinically indicated signs/symptoms require(s) no independent follow-up.   Musculoskeletal: Lower cervical and upper thoracic spondylosis.   IMPRESSION: 1. Primarily similar bilateral upper lobe nodular densities, including a dominant left upper lobe 2.1 cm lesion. Considerations remain 1 or more indolent primary bronchogenic carcinomas or an infectious/inflammatory etiology, including organizing pneumonia. If tissue sampling is not repeated, consider chest CT follow-up at 6 months. 2. Left lung base 6 mm nodular density is new since the prior exam and warrants follow-up attention. 3. Aortic atherosclerosis (ICD10-I70.0) and emphysema (  ICD10-J43.9).         Latest Ref Rng & Units 09/13/2022    2:31 PM 06/28/2022    9:28 AM 03/24/2022   10:25 PM  CBC  WBC 4.0 - 10.5 K/uL 6.0  6.1  6.7    Hemoglobin 12.0 - 15.0 g/dL 69.6  29.5  28.4   Hematocrit 36.0 - 46.0 % 38.3  38.0  37.3   Platelets 150.0 - 400.0 K/uL 260.0  267  281        Latest Ref Rng & Units 09/13/2022    2:31 PM 06/28/2022    9:28 AM 03/24/2022   10:25 PM  BMP  Glucose 70 - 99 mg/dL 132  97  440   BUN 6 - 23 mg/dL 19  12  13    Creatinine 0.40 - 1.20 mg/dL 1.02  7.25  3.66   Sodium 135 - 145 mEq/L 139  140  137   Potassium 3.5 - 5.1 mEq/L 4.1  3.0  3.5   Chloride 96 - 112 mEq/L 102  104  101   CO2 19 - 32 mEq/L 33  25  28   Calcium 8.4 - 10.5 mg/dL 44.0  9.7  9.7     BNP No results found for: "BNP"  ProBNP No results found for: "PROBNP"  PFT No results found for: "FEV1PRE", "FEV1POST", "FVCPRE", "FVCPOST", "TLC", "DLCOUNC", "PREFEV1FVCRT", "PSTFEV1FVCRT"  CT CHEST WO CONTRAST  Result Date: 10/06/2022 CLINICAL DATA:  Follow-up of pulmonary nodule. Bronchoscopy in February with prior negative biopsy. No history cancer. Current smoker. EXAM: CT CHEST WITHOUT CONTRAST TECHNIQUE: Multidetector CT imaging of the chest was performed following the standard protocol without IV contrast. RADIATION DOSE REDUCTION: This exam was performed according to the departmental dose-optimization program which includes automated exposure control, adjustment of the mA and/or kV according to patient size and/or use of iterative reconstruction technique. COMPARISON:  06/24/2022 FINDINGS: Cardiovascular: Aortic atherosclerosis. Aberrant right subclavian artery traversing posterior to the esophagus. Normal heart size, without pericardial effusion. Mediastinum/Nodes: No mediastinal or hilar adenopathy, given limitations of unenhanced CT. Lungs/Pleura: No pleural fluid. Moderate centrilobular and paraseptal emphysema. Mild biapical pleuroparenchymal scarring. Posterolateral right upper lobe 11 mm density on 26/3 is similar, favored to be related to pleuroparenchymal scarring. Calcified granuloma along the right minor fissure of 9 mm.  Pleural-based superior segment right lower lobe 7 mm nodule on 74/3 is similar. Irregular left apical density of 1.4 cm on 23/3 is similar and favored to represent scarring. Left upper lobe pleural-based soft tissue density measures 2.1 x 1.0 cm on 51/3 and is similar to the prior exam. More ill-defined nodularity just caudal to this is also pleural-based at 9 mm on 56/3, similar. Just medial to this, a left upper lobe nodular density measures 7 mm on 50/3 and is similar to on the prior exam (when remeasured). Pleural-based nodule in the left costophrenic angle measures 6 mm on 104/3 and is new since the prior. More posteromedial vague 5 mm nodule on 76/3 is unchanged. Upper Abdomen: Normal imaged portions of the liver, spleen, stomach, pancreas, adrenal glands, right kidney. Upper pole left renal 5.4 cm fluid density lesion is likely a cyst . In the absence of clinically indicated signs/symptoms require(s) no independent follow-up. Musculoskeletal: Lower cervical and upper thoracic spondylosis. IMPRESSION: 1. Primarily similar bilateral upper lobe nodular densities, including a dominant left upper lobe 2.1 cm lesion. Considerations remain 1 or more indolent primary bronchogenic carcinomas or an infectious/inflammatory etiology, including organizing pneumonia. If tissue sampling is  not repeated, consider chest CT follow-up at 6 months. 2. Left lung base 6 mm nodular density is new since the prior exam and warrants follow-up attention. 3. Aortic atherosclerosis (ICD10-I70.0) and emphysema (ICD10-J43.9). Electronically Signed   By: Jeronimo Greaves M.D.   On: 10/06/2022 21:43   XR C-ARM NO REPORT  Result Date: 09/22/2022 Please see Notes tab for imaging impression.  Epidural Steroid injection  Result Date: 09/22/2022 Tyrell Antonio, MD     10/04/2022  5:01 PM Lumbosacral Transforaminal Epidural Steroid Injection - Sub-Pedicular Approach with Fluoroscopic Guidance Patient: [name]     Date of Birth: 10/01/1945  MRN: [id number] PCP: Georgina Quint, MD     Visit Date: 09/22/2022  Universal Protocol:   Date/Time: 09/22/2022 Consent Given By: the patient Position: PRONE Additional Comments: Vital signs were monitored before and after the procedure. Patient was prepped and draped in the usual sterile fashion. The correct patient, procedure, and site was verified. Injection Procedure Details: Procedure diagnoses: Lumbar radiculopathy [M54.16]  Meds Administered: Meds ordered this encounter Medications  methylPREDNISolone acetate (DEPO-MEDROL) injection 80 mg Laterality: Left Location/Site: L5 Needle:5.0 in., 22 ga.  Short bevel or Quincke spinal needle Needle Placement: Transforaminal Findings:   -Comments: Excellent flow of contrast along the nerve, nerve root and into the epidural space. Procedure Details: After squaring off the end-plates to get a true AP view, the C-arm was positioned so that an oblique view of the foramen as noted above was visualized. The target area is just inferior to the "nose of the scotty dog" or sub pedicular. The soft tissues overlying this structure were infiltrated with 2-3 ml. of 1% Lidocaine without Epinephrine. The spinal needle was inserted toward the target using a "trajectory" view along the fluoroscope beam.  Under AP and lateral visualization, the needle was advanced so it did not puncture dura and was located close the 6 O'Clock position of the pedical in AP tracterory. Biplanar projections were used to confirm position. Aspiration was confirmed to be negative for CSF and/or blood. A 1-2 ml. volume of Isovue-250 was injected and flow of contrast was noted at each level. Radiographs were obtained for documentation purposes. After attaining the desired flow of contrast documented above, a 0.5 to 1.0 ml test dose of 0.25% Marcaine was injected into each respective transforaminal space.  The patient was observed for 90 seconds post injection.  After no sensory deficits were reported,  and normal lower extremity motor function was noted,   the above injectate was administered so that equal amounts of the injectate were placed at each foramen (level) into the transforaminal epidural space. Additional Comments: No complications occurred Dressing: 2 x 2 sterile gauze and Band-Aid  Post-procedure details: Patient was observed during the procedure. Post-procedure instructions were reviewed. Patient left the clinic in stable condition.   NCV with EMG (electromyography)  Result Date: 09/14/2022 Tyrell Antonio, MD     09/21/2022  7:35 AM EMG & NCV Findings: Evaluation of the left median (across palm) sensory nerve showed no response (Palm) and prolonged distal peak latency (4.7 ms).  The left ulnar sensory nerve showed prolonged distal peak latency (4.2 ms) and decreased conduction velocity (Wrist-5th Digit, 33 m/s).  All remaining nerves (as indicated in the following tables) were within normal limits.  All examined muscles (as indicated in the following table) showed no evidence of electrical instability.  Impression: The above electrodiagnostic study is ABNORMAL and reveals evidence of a mild left median nerve entrapment at the wrist  affecting sensory components. There is no significant electrodiagnostic evidence of any other focal nerve entrapment, brachial plexopathy or cervical radiculopathy.  As you know, this particular electrodiagnostic study cannot rule out chemical radiculitis or sensory only radiculopathy.  **This electrodiagnostic study cannot rule out small fiber polyneuropathy and dysesthesias from central pain syndromes such as stroke or central pain sensitization syndromes such as fibromyalgia.  Myotomal referral pain from trigger points is also not excluded. Recommendations: 1.  Follow-up with referring physician.  Careful clinical correlation with symptoms. 2.  Continue current management of symptoms. 3.  Continue use of resting splint at night-time and as needed during the day.  ___________________________ Elease Hashimoto Board Certified, American Board of Physical Medicine and Rehabilitation Nerve Conduction Studies Anti Sensory Summary Table  Stim Site NR Peak (ms) Norm Peak (ms) P-T Amp (V) Norm P-T Amp Site1 Site2 Delta-P (ms) Dist (cm) Vel (m/s) Norm Vel (m/s) Left Median Acr Palm Anti Sensory (2nd Digit)  27.9C Wrist    *4.7 <3.6 21.0 >10 Wrist Palm  0.0   Palm *NR  <2.0         Left Radial Anti Sensory (Base 1st Digit)  28.6C Wrist    2.6 <3.1 12.5  Wrist Base 1st Digit 2.6 0.0   Left Ulnar Anti Sensory (5th Digit)  28.8C Wrist    *4.2 <3.7 21.8 >15.0 Wrist 5th Digit 4.2 14.0 *33 >38 Motor Summary Table  Stim Site NR Onset (ms) Norm Onset (ms) O-P Amp (mV) Norm O-P Amp Site1 Site2 Delta-0 (ms) Dist (cm) Vel (m/s) Norm Vel (m/s) Left Median Motor (Abd Poll Brev)  28.5C Wrist    4.1 <4.2 7.9 >5 Elbow Wrist 4.2 22.0 52 >50 Elbow    8.3  8.0        Left Ulnar Motor (Abd Dig Min)  29.4C Wrist    3.4 <4.2 9.5 >3 B Elbow Wrist 3.6 20.0 56 >53 B Elbow    7.0  10.3  A Elbow B Elbow 1.7 9.0 53 >53 A Elbow    8.7  9.8        EMG  Side Muscle Nerve Root Ins Act Fibs Psw Amp Dur Poly Recrt Int Dennie Bible Comment Left Abd Poll Brev Median C8-T1 Nml Nml Nml Nml Nml 0 Nml Nml  Left 1stDorInt Ulnar C8-T1 Nml Nml Nml Nml Nml 0 Nml Nml  Left PronatorTeres Median C6-7 Nml Nml Nml Nml Nml 0 Nml Nml  Left Biceps Musculocut C5-6 Nml Nml Nml Nml Nml 0 Nml Nml  Left Deltoid Axillary C5-6 Nml Nml Nml Nml Nml 0 Nml Nml  Nerve Conduction Studies Anti Sensory Left/Right Comparison  Stim Site L Lat (ms) R Lat (ms) L-R Lat (ms) L Amp (V) R Amp (V) L-R Amp (%) Site1 Site2 L Vel (m/s) R Vel (m/s) L-R Vel (m/s) Median Acr Palm Anti Sensory (2nd Digit)  27.9C Wrist *4.7   21.0   Wrist Palm    Palm            Radial Anti Sensory (Base 1st Digit)  28.6C Wrist 2.6   12.5   Wrist Base 1st Digit    Ulnar Anti Sensory (5th Digit)  28.8C Wrist *4.2   21.8   Wrist 5th Digit *33   Motor Left/Right Comparison  Stim Site  L Lat (ms) R Lat (ms) L-R Lat (ms) L Amp (mV) R Amp (mV) L-R Amp (%) Site1 Site2 L Vel (m/s) R Vel (m/s) L-R Vel (m/s) Median Motor (Abd Poll Brev)  28.5C Wrist 4.1   7.9   Elbow Wrist 52   Elbow 8.3   8.0        Ulnar Motor (Abd Dig Min)  29.4C Wrist 3.4   9.5   B Elbow Wrist 56   B Elbow 7.0   10.3   A Elbow B Elbow 53   A Elbow 8.7   9.8        Waveforms:          Past medical hx Past Medical History:  Diagnosis Date   Anxiety    Atherosclerosis    Bronchitis due to tobacco use    quit smoking 3 months ago   Chronic kidney disease    stage 3a, patient has kidney cyst   Chronic sciatica of left side 04/01/2015   COPD (chronic obstructive pulmonary disease) (HCC)    Depression    Dyspnea    occasional   GERD (gastroesophageal reflux disease)    no current problems and no meds   Hypertension    Mitral valve prolapse    years ago   Pneumonia    years ago   Polio    age 56 years old   Pre-diabetes    no meds     Social History   Tobacco Use   Smoking status: Every Day    Packs/day: 1.50    Years: 30.00    Additional pack years: 0.00    Total pack years: 45.00    Types: Cigarettes   Smokeless tobacco: Never   Tobacco comments:    currently smokes a half of pack of ciggs daily. As of 07/05/2022 LW  Vaping Use   Vaping Use: Never used  Substance Use Topics   Alcohol use: Not Currently    Comment: occasional   Drug use: No    Ms.Abdelkader reports that she has been smoking cigarettes. She has a 45.00 pack-year smoking history. She has never used smokeless tobacco. She reports that she does not currently use alcohol. She reports that she does not use drugs.  Tobacco Cessation: Current every day smoker , 45 pack year smoking history Past surgical hx, Family hx, Social hx all reviewed.  Current Outpatient Medications on File Prior to Visit  Medication Sig   aspirin 325 MG tablet Take 325 mg by mouth as needed for mild pain or headache.   atorvastatin (LIPITOR) 40 MG  tablet Take 40 mg by mouth daily.   benzonatate (TESSALON) 100 MG capsule Take 100 mg by mouth as needed.   BIOTIN PO Take 2 capsules by mouth in the morning.   diazepam (VALIUM) 5 MG tablet Take 5-15 mg by mouth daily as needed for anxiety.   DULoxetine (CYMBALTA) 60 MG capsule Take 1 capsule (60 mg total) by mouth daily.   Homeopathic Products (LEG CRAMP RELIEF) SUBL Place 1 tablet under the tongue daily as needed (leg cramps). [employer]   Magnesium Bisglycinate (MAG GLYCINATE) 100 MG TABS Take 300 mg by mouth at bedtime.   zolpidem (AMBIEN) 10 MG tablet Take 1 tablet (10 mg total) by mouth at bedtime as needed for sleep.   Current Facility-Administered Medications on File Prior to Visit  Medication   0.9 %  sodium chloride infusion     Allergies  Allergen Reactions   Amlodipine Other (See Comments)    Edema   Rosuvastatin Rash   Sulfa Antibiotics Other (See Comments)    Unknown    Wellbutrin [Bupropion] Other (See Comments)    Hallucinations  Lisinopril Cough    Review Of Systems:  Constitutional:   No  weight loss, night sweats,  Fevers, chills, fatigue, or  lassitude.  HEENT:   No headaches,  Difficulty swallowing,  Tooth/dental problems, or  Sore throat,                No sneezing, itching, ear ache, nasal congestion, post nasal drip,   CV:  No chest pain,  Orthopnea, PND, swelling in lower extremities, anasarca, dizziness, palpitations, syncope.   GI  No heartburn, indigestion, abdominal pain, nausea, vomiting, diarrhea, change in bowel habits, loss of appetite, bloody stools.   Resp: No shortness of breath with exertion or at rest.  No excess mucus, no productive cough,  No non-productive cough,  No coughing up of blood.  No change in color of mucus.  No wheezing.  No chest wall deformity  Skin: no rash or lesions.  GU: no dysuria, change in color of urine, no urgency or frequency.  No flank pain, no hematuria   MS:  No joint pain or swelling.  No decreased  range of motion.  No back pain.  Psych:  No change in mood or affect. No depression or anxiety.  No memory loss.   Vital Signs BP (!) 152/84   Pulse 76   Temp 98 F (36.7 C) (Oral)   Ht 5\' 2"  (1.575 m)   Wt 166 lb 12.8 oz (75.7 kg)   SpO2 98%   BMI 30.51 kg/m    Physical Exam:  General- No distress,  A&Ox3, pleasant ENT: No sinus tenderness, TM clear, pale nasal mucosa, no oral exudate,no post nasal drip, no LAN Cardiac: S1, S2, regular rate and rhythm, no murmur Chest: No wheeze/ rales/ dullness; no accessory muscle use, no nasal flaring, no sternal retractions Abd.: Soft Non-tender, ND, BS +, Body mass index is 30.51 kg/m.  Ext: No clubbing cyanosis, edema Neuro:  normal strength, MAE x 4, A&O x 3, slightly deconditioned.  Skin: No rashes, warm and dry, no lesions Psych: normal mood and behavior   Assessment/Plan Lung Nodule Tobacco Abuse  Abnormal PET Abnormal CT Chest Biopsies negative for malignancy 06/2022 Plan The lung nodules we are following are similar in size as they were 06/2022, but there is also a new 6 mm nodule that we will need to follow. We will do a 3 month follow up CT Chest.  You will get a call to schedule this closer to the time . We will do a follow up visit to review the results Please contact office for sooner follow up if symptoms do not improve or worsen or seek emergency care   Call us if you need [place] .  I spent 35 minutes dedicated to the care of this patient on the date of this encounter to include pre-visit review of records, face-to-face time with the patient discussing conditions above, post visit ordering of testing, clinical documentation with the electronic health record, making appropriate referrals as documented, and communicating necessary information to the patient's healthcare team.   Bevelyn Ngo, NP 10/11/2022  12:16 PM

## 2022-10-11 NOTE — Progress Notes (Signed)
Subjective:   [name] is a 77 y.o. female who presents for Medicare Annual (Subsequent) preventive examination.  Review of Systems    No ROS. Medicare Wellness Visit. Additional risk factors are reflected in social history. Cardiac Risk Factors include: advanced age (>29men, >58 women);smoking/ tobacco exposure;sedentary lifestyle;obesity (BMI >30kg/m2);dyslipidemia;hypertension     Objective:    Today's Vitals   10/11/22 1257 10/11/22 1311  BP: 132/84   Pulse: 78   SpO2: 98%   Weight: 166 lb (75.3 kg)   Height: 5\' 2"  (1.575 m)   PainSc:  7    Body mass index is 30.36 kg/m.     10/11/2022    1:19 PM 06/28/2022    9:01 AM 03/24/2022    9:48 PM 10/11/2018   10:56 AM 10/09/2018    1:27 PM 07/30/2015   12:15 PM 04/08/2015    8:45 AM  Advanced Directives  Does Patient Have a Medical Advance Directive? Yes Yes No No No Yes No  Type of Estate agent of [place];Living will Living will       Does patient want to make changes to medical advance directive? No - Patient declined        Copy of Healthcare Power of Attorney in Chart? No - copy requested     Yes   Would patient like information on creating a medical advance directive?   No - Patient declined No - Patient declined   No - patient declined information    Current Medications (verified) Outpatient Encounter Medications as of 10/11/2022  Medication Sig   aspirin 325 MG tablet Take 325 mg by mouth as needed for mild pain or headache.   atorvastatin (LIPITOR) 40 MG tablet Take 40 mg by mouth daily.   BIOTIN PO Take 2 capsules by mouth in the morning.   diazepam (VALIUM) 5 MG tablet Take 5-15 mg by mouth daily as needed for anxiety.   DULoxetine (CYMBALTA) 60 MG capsule Take 1 capsule (60 mg total) by mouth daily.   Homeopathic Products (LEG CRAMP RELIEF) SUBL Place 1 tablet under the tongue daily as needed (leg cramps). [employer]   Magnesium Bisglycinate (MAG GLYCINATE) 100 MG TABS Take 300 mg by  mouth at bedtime.   zolpidem (AMBIEN) 10 MG tablet Take 1 tablet (10 mg total) by mouth at bedtime as needed for sleep.   [DISCONTINUED] benzonatate (TESSALON) 100 MG capsule Take 100 mg by mouth as needed. (Patient not taking: Reported on 10/11/2022)   [DISCONTINUED] hydrochlorothiazide (HYDRODIURIL) 25 MG tablet Take 25 mg by mouth daily.  (Patient not taking: Reported on 10/11/2022)   [DISCONTINUED] HYDROcodone-acetaminophen (NORCO) 7.5-325 MG tablet Take 1 tablet by mouth as needed.   [DISCONTINUED] predniSONE (STERAPRED UNI-PAK 21 TAB) 5 MG (21) TBPK tablet Take 6,5,4,3,2,1 one tablet less each day, take with food (Patient not taking: Reported on 10/11/2022)   Facility-Administered Encounter Medications as of 10/11/2022  Medication   0.9 %  sodium chloride infusion    Allergies (verified) Amlodipine, Rosuvastatin, Sulfa antibiotics, Wellbutrin [bupropion], and Lisinopril   History: Past Medical History:  Diagnosis Date   Anxiety    Atherosclerosis    Bronchitis due to tobacco use    quit smoking 3 months ago   Chronic kidney disease    stage 3a, patient has kidney cyst   Chronic sciatica of left side 04/01/2015   COPD (chronic obstructive pulmonary disease) (HCC)    Depression    Dyspnea    occasional   GERD (  gastroesophageal reflux disease)    no current problems and no meds   Hypertension    Mitral valve prolapse    years ago   Pneumonia    years ago   Polio    age 89 years old   Pre-diabetes    no meds   Past Surgical History:  Procedure Laterality Date   ABDOMINAL HYSTERECTOMY  1985   BACK SURGERY     BRONCHIAL BIOPSY  06/28/2022   Procedure: BRONCHIAL BIOPSIES;  Surgeon: Josephine Igo, DO;  Location: [facility];  Service: Pulmonary;;   BRONCHIAL BRUSHINGS  06/28/2022   Procedure: BRONCHIAL BRUSHINGS;  Surgeon: Josephine Igo, DO;  Location: [facility];  Service: Pulmonary;;   COLONOSCOPY  2017   EYE SURGERY     cataracts removed bilateral   left leg  surgery Left    LUMBAR LAMINECTOMY Left 02/21/2014   Procedure: Left L5-S1 Hemilaminectomy, Lateral Recess Decompression, Removal Synovial Cyst;  Surgeon: Eldred Manges, MD;  Location: [facility] OR;  Service: Orthopedics;  Laterality: Left;   MICROLARYNGOSCOPY N/A 10/11/2018   Procedure: MICROLARYNGOSCOPY with Biopsy;  Surgeon: Serena Colonel, MD;  Location: [facility] OR;  Service: ENT;  Laterality: N/A;   polio Bilateral 1950's   surgery on feet and legs for polio   TONSILLECTOMY     Family History  Problem Relation Age of Onset   Cancer Mother        thyroid   Heart attack Father    Cancer Sister        breast   Kidney disease Sister        ESRD   Irritable bowel syndrome Sister    Cancer Sister        thyroid   Colon cancer Neg Hx    Esophageal cancer Neg Hx    Social History   Socioeconomic History   Marital status: Married    Spouse name: [name]   Number of children: 1   Years of education: College   Highest education level: Not on file  Occupational History   Occupation: Retired    Associate Professor: [employer]  Tobacco Use   Smoking status: Every Day    Packs/day: 1.50    Years: 30.00    Additional pack years: 0.00    Total pack years: 45.00    Types: Cigarettes   Smokeless tobacco: Never   Tobacco comments:    currently smokes a half of pack of ciggs daily. As of 07/05/2022 LW  Vaping Use   Vaping Use: Never used  Substance and Sexual Activity   Alcohol use: Not Currently    Comment: occasional   Drug use: No   Sexual activity: Not Currently    Birth control/protection: Surgical    Comment: Hysterectomy  Other Topics Concern   Not on file  Social History Narrative   Patient lives at home with her spouse.   Caffeine Use: daily   Social Determinants of Health   Financial Resource Strain: Low Risk  (10/11/2022)   Overall Financial Resource Strain (CARDIA)    Difficulty of Paying Living Expenses: Not hard at all  Food Insecurity: No Food Insecurity (10/11/2022)   Hunger Vital  Sign    Worried About Running Out of Food in the Last Year: Never true    Ran Out of Food in the Last Year: Never true  Transportation Needs: No Transportation Needs (10/11/2022)   PRAPARE - Administrator, Civil Service (Medical): No    Lack of Transportation (Non-Medical):  No  Physical Activity: Insufficiently Active (10/11/2022)   Exercise Vital Sign    Days of Exercise per Week: 5 days    Minutes of Exercise per Session: 10 min  Stress: Stress Concern Present (10/11/2022)   [employer] of Occupational Health - Occupational Stress Questionnaire    Feeling of Stress : Rather much  Social Connections: Moderately Isolated (10/11/2022)   Social Connection and Isolation Panel [NHANES]    Frequency of Communication with Friends and Family: Once a week    Frequency of Social Gatherings with Friends and Family: Never    Attends Religious Services: More than 4 times per year    Active Member of [employer] or Organizations: No    Attends Banker Meetings: Never    Marital Status: Married    Tobacco Counseling Ready to quit: Not Answered Counseling given: Not Answered Tobacco comments: currently smokes a half of pack of ciggs daily. As of 07/05/2022 LW   Clinical Intake:  Pre-visit preparation completed: Yes  Pain : 0-10 Pain Score: 7  Pain Type: Chronic pain Pain Location: Back Pain Orientation: Lower Pain Descriptors / Indicators: Aching, Constant Pain Onset: More than a month ago (due to back surgery years ago) Pain Frequency: Constant     BMI - recorded: 30 Nutritional Status: BMI > 30  Obese Nutritional Risks: None Diabetes: No  How often do you need to have someone help you when you read instructions, pamphlets, or other written materials from your doctor or pharmacy?: 1 - Never What is the last grade level you completed in school?: 2 years of college  Interpreter Needed?: No  Information entered by :: Elyse Jarvis, CMA   Activities of  Daily Living    10/11/2022    1:20 PM  In your present state of health, do you have any difficulty performing the following activities:  Hearing? 1  Comment has hearing aids  Vision? 0  Difficulty concentrating or making decisions? 0  Walking or climbing stairs? 0  Dressing or bathing? 0  Doing errands, shopping? 0  Preparing Food and eating ? N  Using the Toilet? N  In the past six months, have you accidently leaked urine? N  Do you have problems with loss of bowel control? N  Managing your Medications? N  Managing your Finances? N  Housekeeping or managing your Housekeeping? N    Patient Care Team: Georgina Quint, MD as PCP - General (Internal Medicine) Eldred Manges, MD as Consulting Physician (Orthopedic Surgery)  Indicate any recent Medical Services you may have received from other than [facility] providers in the past year (date may be approximate).     Assessment:   This is a routine wellness examination for [place].  Hearing/Vision screen Patient has hearing difficulty and wears hearing aids. Patient does wear corrective lenses.  Dietary issues and exercise activities discussed: Current Exercise Habits: The patient does not participate in regular exercise at present, Exercise limited by: respiratory conditions(s);orthopedic condition(s)   Goals Addressed             This Visit's Progress    Quit Smoking       I have been trying to work on quitting smoking since I have been diagnosed with COPD.       Depression Screen    10/11/2022    1:18 PM 09/13/2022    2:07 PM 08/09/2022    2:06 PM  PHQ 2/9 Scores  PHQ - 2 Score 4 3 1   PHQ- 9 Score  6 4 12     Fall Risk    10/11/2022    1:19 PM 09/13/2022    2:06 PM 08/09/2022    2:06 PM 12/01/2015    1:05 PM  Fall Risk   Falls in the past year? 1 1 1  Yes  Number falls in past yr: 0 0 0 1  Injury with Fall? 0 0 0 No  Risk for fall due to : History of fall(s);Impaired balance/gait No Fall Risks No Fall Risks    Follow up Falls evaluation completed Falls evaluation completed Falls evaluation completed Falls prevention discussed    FALL RISK PREVENTION PERTAINING TO THE HOME:  Any stairs in or around the home? No  If so, are there any without handrails?  N/A Home free of loose throw rugs in walkways, pet beds, electrical cords, etc? Yes  Adequate lighting in your home to reduce risk of falls? Yes   ASSISTIVE DEVICES UTILIZED TO PREVENT FALLS:  Life alert? Yes  Use of a cane, walker or w/c? Yes cane Grab bars in the bathroom? Yes  Shower chair or bench in shower? Yes  Elevated toilet seat or a handicapped toilet? Yes   TIMED UP AND GO:  Was the test performed? No .  Length of time to ambulate 10 feet: N/A sec.   Gait slow and steady with assistive device  Cognitive Function:  Patient is cogitatively intact.      10/11/2022    1:21 PM  6CIT Screen  What Year? 0 points  What month? 0 points  What time? 0 points  Count back from 20 0 points  Months in reverse 0 points  Repeat phrase 0 points  Total Score 0 points    Immunizations Immunization History  Administered Date(s) Administered   Influenza, High Dose Seasonal PF 04/13/2018, 03/23/2019   Influenza-Unspecified 04/15/2008, 05/11/2009, 04/27/2010, 01/03/2011, 03/29/2015, 04/05/2016, 03/13/2017, 04/22/2018, 03/02/2020, 03/02/2020   Moderna SARS-COV2 Booster Vaccination 03/18/2022   PFIZER(Purple Top)SARS-COV-2 Vaccination 07/06/2019, 07/30/2019, 03/21/2020   Pneumococcal Conjugate-13 01/31/2015, 04/13/2018, 04/22/2018   Pneumococcal-Unspecified 05/14/2010   Tdap 02/20/2014   Zoster, Live 11/19/2013    TDAP status: Up to date  Flu Vaccine status: Declined, Education has been provided regarding the importance of this vaccine but patient still declined. Advised may receive this vaccine at local pharmacy or Health Dept. Aware to provide a copy of the vaccination record if obtained from local pharmacy or Health Dept.  Verbalized acceptance and understanding.  Pneumococcal vaccine status: Up to date per patient, advised to bring her immunization records she has  Covid-19 vaccine status: Completed vaccines  Qualifies for Shingles Vaccine? Yes   Zostavax completed No   Shingrix Completed?: No.    Education has been provided regarding the importance of this vaccine. Patient has been advised to call insurance company to determine out of pocket expense if they have not yet received this vaccine. Advised may also receive vaccine at local pharmacy or Health Dept. Verbalized acceptance and understanding.  Screening Tests Health Maintenance  Topic Date Due   Hepatitis C Screening  Never done   DEXA SCAN  Never done   Pneumonia Vaccine 88+ Years old (2 of 2 - PPSV23 or PCV20) 06/17/2018   COVID-19 Vaccine (4 - 2023-24 season) 10/27/2022 (Originally 05/13/2022)   Zoster Vaccines- Shingrix (1 of 2) 01/11/2023 (Originally 10/22/1964)   INFLUENZA VACCINE  12/22/2022   Lung Cancer Screening  10/03/2023   Medicare Annual Wellness (AWV)  10/11/2023   DTaP/Tdap/Td (2 - Td or Tdap) 02/21/2024  HPV VACCINES  Aged Out   COLONOSCOPY (Pts 45-55yrs Insurance coverage will need to be confirmed)  Discontinued    Health Maintenance  Health Maintenance Due  Topic Date Due   Hepatitis C Screening  Never done   DEXA SCAN  Never done   Pneumonia Vaccine 73+ Years old (2 of 2 - PPSV23 or PCV20) 06/17/2018    Colorectal cancer screening: No longer required.   Mammogram status: No longer required due to age.  Bone Density status: Never done, patient declines  Lung Cancer Screening: (Low Dose CT Chest recommended if Age 60-80 years, 30 pack-year currently smoking OR have quit w/in 15years.) does qualify.   Lung Cancer Screening Referral: patient declines  Additional Screening:  Hepatitis C Screening: does qualify; Completed N/A  Vision Screening: Recommended annual ophthalmology exams for early detection of glaucoma  and other disorders of the eye. Is the patient up to date with their annual eye exam?  Yes  Who is the provider or what is the name of the office in which the patient attends annual eye exams? Dr. Nile Riggs If pt is not established with a provider, would they like to be referred to a provider to establish care? No .   Dental Screening: Recommended annual dental exams for proper oral hygiene  Community Resource Referral / Chronic Care Management: CRR required this visit?  No   CCM required this visit?  No      Plan:     I have personally reviewed and noted the following in the patient's chart:   Medical and social history Use of alcohol, tobacco or illicit drugs  Current medications and supplements including opioid prescriptions. Patient is not currently taking opioid prescriptions. Functional ability and status Nutritional status Physical activity Advanced directives List of other physicians Hospitalizations, surgeries, and ER visits in previous 12 months Vitals Screenings to include cognitive, depression, and falls Referrals and appointments  In addition, I have reviewed and discussed with patient certain preventive protocols, quality metrics, and best practice recommendations. A written personalized care plan for preventive services as well as general preventive health recommendations were provided to patient.     Marinus Maw, CMA   10/11/2022   Nurse Notes: Patient plans to discuss with Dr. Alvy Bimler switching from Ambien to alternative medication Trazodone. She states she will send him a Clinical cytogeneticist message.

## 2022-10-11 NOTE — Patient Instructions (Signed)
It was great speaking with you today!  Please schedule your next Medicare Wellness Visit with your Nurse Health Advisor in 1 year by calling [phone]. 

## 2022-11-23 ENCOUNTER — Telehealth: Payer: Self-pay | Admitting: Emergency Medicine

## 2022-11-23 ENCOUNTER — Other Ambulatory Visit: Payer: Self-pay | Admitting: Emergency Medicine

## 2022-11-23 MED ORDER — ZOLPIDEM TARTRATE 10 MG PO TABS: 10.0000 mg | ORAL_TABLET | Freq: Every evening | ORAL | 1 refills | Status: DC | PRN

## 2022-11-23 NOTE — Telephone Encounter (Signed)
Patient would like to know if she can have her prescription decreased to 5 mg.   Prescription Request  11/23/2022  LOV: 09/13/2022  What is the name of the medication or equipment?  zolpidem (AMBIEN)  Have you contacted your pharmacy to request a refill? Yes   Which pharmacy would you like this sent to?  [id number] - [place], [place] - [address] [place] [address] Phone: [phone] Fax: [phone]    Patient notified that their request is being sent to the clinical staff for review and that they should receive a response within 2 business days.   Please advise at Mobile [phone] (mobile)

## 2022-11-23 NOTE — Telephone Encounter (Signed)
New prescription sent to pharmacy of record today.  Thanks.

## 2022-12-28 ENCOUNTER — Telehealth: Payer: Self-pay | Admitting: Emergency Medicine

## 2022-12-28 ENCOUNTER — Other Ambulatory Visit: Payer: Self-pay | Admitting: Emergency Medicine

## 2022-12-28 DIAGNOSIS — F5104 Psychophysiologic insomnia: Secondary | ICD-10-CM

## 2022-12-28 MED ORDER — ZOLPIDEM TARTRATE 5 MG PO TABS: 5.0000 mg | ORAL_TABLET | Freq: Every evening | ORAL | 1 refills | Status: DC | PRN

## 2022-12-28 NOTE — Telephone Encounter (Signed)
Prescription Request  12/28/2022  LOV: 09/13/2022  What is the name of the medication or equipment? Ambian 5mg   Have you contacted your pharmacy to request a refill? Yes   Which pharmacy would you like this sent to?  [facility] - [place], [place] - [address] [place] [address] Phone: [phone] Fax: [phone]     Patient notified that their request is being sent to the clinical staff for review and that they should receive a response within 2 business days.   Please advise at Mobile [phone] (mobile)    PATIENT WANTS TO GO BACK TO 5MG .  PLEASE SEND DO NOT SEND 10 MG. AGAIN

## 2022-12-28 NOTE — Telephone Encounter (Signed)
New prescription for Ambien 5 mg sent to pharmacy of record today.  Thanks.

## 2023-01-06 ENCOUNTER — Ambulatory Visit (HOSPITAL_BASED_OUTPATIENT_CLINIC_OR_DEPARTMENT_OTHER)
Admission: RE | Admit: 2023-01-06 | Discharge: 2023-01-06 | Disposition: A | Discharge status: Home / Self Care | Payer: Medicare HMO | Source: Ambulatory Visit | Attending: Acute Care | Admitting: Acute Care

## 2023-01-06 DIAGNOSIS — R911 Solitary pulmonary nodule: Secondary | ICD-10-CM

## 2023-01-06 DIAGNOSIS — R918 Other nonspecific abnormal finding of lung field: Secondary | ICD-10-CM | POA: Diagnosis not present

## 2023-01-06 DIAGNOSIS — J432 Centrilobular emphysema: Secondary | ICD-10-CM | POA: Diagnosis not present

## 2023-01-13 ENCOUNTER — Encounter: Payer: Self-pay | Admitting: Acute Care

## 2023-01-13 ENCOUNTER — Ambulatory Visit: Payer: Medicare HMO | Admitting: Acute Care

## 2023-01-13 ENCOUNTER — Telehealth: Payer: Self-pay | Admitting: Acute Care

## 2023-01-13 VITALS — BP 122/64 | HR 69 | Temp 97.1°F | Ht 62.0 in | Wt 162.0 lb

## 2023-01-13 DIAGNOSIS — R911 Solitary pulmonary nodule: Secondary | ICD-10-CM | POA: Diagnosis not present

## 2023-01-13 DIAGNOSIS — F17200 Nicotine dependence, unspecified, uncomplicated: Secondary | ICD-10-CM

## 2023-01-13 NOTE — Progress Notes (Signed)
History of Present Illness [name] is a 77 y.o. female current every day smoker with abnormal findings on Lung Cancer Screening Scan 05/11/2022.She is followed by Dr. Tonia Brooms and the [facility].   Synopsis 77 y.o. female  current every day smoker with abnormal findings on Lung Cancer Screening Scan 05/11/2022.She is followed by Dr. Tonia Brooms and the [facility]. Follow up PET scan was concerning for Hypermetabolic ground-glass nodule in the LEFT upper lobe >> read as concerning for bronchogenic carcinoma.She underwent a Flexible video fiberoptic bronchoscopy with robotic assistance and biopsies on 06/28/2022. Biopsies were negative for malignancy. Plan was for a 3 month follow up CT to re-assess for stability of nodules.    01/13/2023 Pt. Presents for follow up after follow up Ct Chest. The nodule has shown continued growth, and remains concerning for primary lung adenocarcinoma. She is continuing to smoke. We have discussed her results, and that the nodule is slowly growing. She would prefer repeat biopsy to radiation therapy. She states she needs to think about this. She is not sure if she would want treatment. I have encouraged her to keep an open mind. Treatments are very different today. She is in agreement with seeing what  Dr. Tonia Brooms thinks. If he feels she should go and see radiation oncology, we can refer her , and then she can learn more about treatment options. If she is uncomfortable with radiation treatments, she would want a repeat biopsy.   I have spoken with Dr. Tonia Brooms. He is in agreement with repeating the bronch with biopsy . Pt. Wants to speak with her family about this over the weekend before she makes a decision. She will call us Monday to let us know what she wants to do.  We talked about quitting smoking. She has been successful quitting in the past. She does not want to use patches again. I have talked with her about hypnosis. She is not  interested at this time. I have asked her to call me if she changes her mind.   Test Results: CT Chest 01/06/2023 Slowly enlarging ill-defined subpleural solid nodule of the left upper lobe, remains concerning for primary lung adenocarcinoma. 2. Subsolid nodular opacities of the bilateral lung apices are stable when compared with multiple prior exams and likely due to scarring. 3. Additional scattered small solid pulmonary nodules are stable. 4. No enlarged lymph nodes seen in the chest. 5. Aortic Atherosclerosis (ICD10-I70.0) and Emphysema (ICD10-J43.9).        Latest Ref Rng & Units 09/13/2022    2:31 PM 06/28/2022    9:28 AM 03/24/2022   10:25 PM  CBC  WBC 4.0 - 10.5 K/uL 6.0  6.1  6.7   Hemoglobin 12.0 - 15.0 g/dL 96.2  95.2  84.1   Hematocrit 36.0 - 46.0 % 38.3  38.0  37.3   Platelets 150.0 - 400.0 K/uL 260.0  267  281        Latest Ref Rng & Units 09/13/2022    2:31 PM 06/28/2022    9:28 AM 03/24/2022   10:25 PM  BMP  Glucose 70 - 99 mg/dL 324  97  401   BUN 6 - 23 mg/dL 19  12  13    Creatinine 0.40 - 1.20 mg/dL 0.27  2.53  6.64   Sodium 135 - 145 mEq/L 139  140  137   Potassium 3.5 - 5.1 mEq/L 4.1  3.0  3.5   Chloride 96 - 112 mEq/L 102  104  101   CO2 19 - 32 mEq/L 33  25  28   Calcium 8.4 - 10.5 mg/dL 16.1  9.7  9.7     BNP No results found for: "BNP"  ProBNP No results found for: "PROBNP"  PFT No results found for: "FEV1PRE", "FEV1POST", "FVCPRE", "FVCPOST", "TLC", "DLCOUNC", "PREFEV1FVCRT", "PSTFEV1FVCRT"  CT CHEST WO CONTRAST  Result Date: 01/12/2023 CLINICAL DATA:  Lung nodule follow-up EXAM: CT CHEST WITHOUT CONTRAST TECHNIQUE: Multidetector CT imaging of the chest was performed following the standard protocol without IV contrast. RADIATION DOSE REDUCTION: This exam was performed according to the departmental dose-optimization program which includes automated exposure control, adjustment of the mA and/or kV according to patient size and/or use of iterative  reconstruction technique. COMPARISON:  Chest CT dated Oct 03, 2022; chest CT dated December 18th 2023; chest CT dated Sep 26, 2018 FINDINGS: Cardiovascular: Normal heart size. No pericardial effusion. Normal caliber thoracic aorta with mild calcified plaque. Mild coronary artery calcifications. 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 Mediastinum/Nodes: Small hiatal hernia. Thyroid is unremarkable. No enlarged lymph nodes seen in the chest. Lungs/Pleura: Central airways are patent. Moderate centrilobular emphysema. Ill-defined subpleural solid nodule of the left upper lobe on series 4, image 44, difficult to measure due to irregularity, largest solid component measures 2.4 x 1.0 cm on series 4 image 45, previously 2.2 x 0.8 cm when remeasured in similar plane. When compared with May 09, 2022 prior nodule measures 1.4 x 0.9 cm at that time. Irregular subsolid nodular opacities are seen in the bilateral lung apices, unchanged when compared with multiple prior exams likely due to scarring. Calcified nodule of the right middle lobe, likely a granuloma. Stable scattered small solid pulmonary nodules. Reference nodule of the left lower lobe measuring 5 mm on series 4, image 72. Upper Abdomen: Large simple appearing cyst of the left kidney, no specific follow-up imaging is necessary. Musculoskeletal: No chest wall mass or suspicious bone lesions identified. IMPRESSION: 1. Slowly enlarging ill-defined subpleural solid nodule of the left upper lobe, remains concerning for primary lung adenocarcinoma. 2. Subsolid nodular opacities of the bilateral lung apices are stable when compared with multiple prior exams and likely due to scarring. 3. Additional scattered small solid pulmonary nodules are stable. 4. No enlarged lymph nodes seen in the chest. 5. Aortic Atherosclerosis (ICD10-I70.0) and Emphysema (ICD10-J43.9). Electronically Signed   By: Allegra Lai M.D.   On: 01/12/2023 15:32     Past medical hx Past  Medical History:  Diagnosis Date   Anxiety    Atherosclerosis    Bronchitis due to tobacco use    quit smoking 3 months ago   Chronic kidney disease    stage 3a, patient has kidney cyst   Chronic sciatica of left side 04/01/2015   COPD (chronic obstructive pulmonary disease) (HCC)    Depression    Dyspnea    occasional   GERD (gastroesophageal reflux disease)    no current problems and no meds   Hypertension    Mitral valve prolapse    years ago   Pneumonia    years ago   Polio    age 89 years old   Pre-diabetes    no meds     Social History   Tobacco Use   Smoking status: Every Day    Current packs/day: 1.50    Average packs/day: 1.5 packs/day for 30.0 years (45.0 ttl pk-yrs)    Types: Cigarettes   Smokeless tobacco: Never  Vaping Use  Vaping status: Never Used  Substance Use Topics   Alcohol use: Not Currently    Comment: occasional   Drug use: No    Ms.Andre reports that she has been smoking cigarettes. She has a 45 pack-year smoking history. She has never used smokeless tobacco. She reports that she does not currently use alcohol. She reports that she does not use drugs.  Tobacco Cessation: Current every day smoker. Counseled to quit.    Past surgical hx, Family hx, Social hx all reviewed.  Current Outpatient Medications on File Prior to Visit  Medication Sig   aspirin 325 MG tablet Take 325 mg by mouth as needed for mild pain or headache.   atorvastatin (LIPITOR) 40 MG tablet Take 40 mg by mouth daily.   BIOTIN PO Take 2 capsules by mouth in the morning.   diazepam (VALIUM) 5 MG tablet Take 5-15 mg by mouth daily as needed for anxiety.   DULoxetine (CYMBALTA) 60 MG capsule Take 1 capsule (60 mg total) by mouth daily.   Homeopathic Products (LEG CRAMP RELIEF) SUBL Place 1 tablet under the tongue daily as needed (leg cramps). [employer]   Magnesium Bisglycinate (MAG GLYCINATE) 100 MG TABS Take 300 mg by mouth at bedtime.   zolpidem (AMBIEN) 5 MG tablet  Take 1 tablet (5 mg total) by mouth at bedtime as needed for sleep.   Current Facility-Administered Medications on File Prior to Visit  Medication   0.9 %  sodium chloride infusion     Allergies  Allergen Reactions   Amlodipine Other (See Comments)    Edema   Rosuvastatin Rash   Sulfa Antibiotics Other (See Comments)    Unknown    Wellbutrin [Bupropion] Other (See Comments)    Hallucinations   Lisinopril Cough    Review Of Systems:  Constitutional:   No  weight loss, night sweats,  Fevers, chills, fatigue, or  lassitude.  HEENT:   No headaches,  Difficulty swallowing,  Tooth/dental problems, or  Sore throat,                No sneezing, itching, ear ache, nasal congestion, post nasal drip,   CV:  No chest pain,  Orthopnea, PND, swelling in lower extremities, anasarca, dizziness, palpitations, syncope.   GI  No heartburn, indigestion, abdominal pain, nausea, vomiting, diarrhea, change in bowel habits, loss of appetite, bloody stools.   Resp: No shortness of breath with exertion or at rest.  No excess mucus, no productive cough,  No non-productive cough,  No coughing up of blood.  No change in color of mucus.  No wheezing.  No chest wall deformity  Skin: no rash or lesions.  GU: no dysuria, change in color of urine, no urgency or frequency.  No flank pain, no hematuria   MS:  No joint pain or swelling.  No decreased range of motion.  No back pain.  Psych:  No change in mood or affect. No depression or anxiety.  No memory loss.   Vital Signs BP 122/64 (BP Location: Left Arm, Patient Position: Sitting, Cuff Size: Normal)   Pulse 69   Temp (!) 97.1 F (36.2 C) (Temporal)   Ht 5\' 2"  (1.575 m)   Wt 162 lb (73.5 kg)   SpO2 99%   BMI 29.63 kg/m    Physical Exam:  General- No distress,  A&Ox3, pleasant ENT: No sinus tenderness, TM clear, pale nasal mucosa, no oral exudate,no post nasal drip, no LAN Cardiac: S1, S2, regular rate and rhythm, no  murmur Chest: No wheeze/  rales/ dullness; no accessory muscle use, no nasal flaring, no sternal retractions, slightly diminished per bases Abd.: Soft Non-tender, ND, BS +, Body mass index is 29.63 kg/m.  Ext: No clubbing cyanosis, edema Neuro:  normal strength, MAE x 4, A&O x 3 Skin: No rashes, warm and dry, no lesions  Psych: normal mood and behavior   Assessment/Plan Slowly enlarging ill-defined subpleural solid nodule of the left upper lobe, remains concerning for primary lung adenocarcinoma. Current Every Day Smoker  Previous bronch 06/28/2022 with no malignant cells Plan You CT scan shows some slow growth of the lung nodule we have been watching. We will be in touch regarding next steps based on what you and I have discussed.  If Dr. Tonia Brooms feels we should consult radiation oncology, we will send you there to learn more about the treatment options. If you are not comfortable with radiation after you have learned more about it, we will plan a repeat biopsy.  Please Work on quitting smoking.  I know this is hard.  Call if you would like to try hypnosis, we can provide a recommendation . We will call you once we have heard from Dr. Tonia Brooms.  Please contact office for sooner follow up if symptoms do not improve or worsen or seek emergency care     Addendum 01/24/2023 Pt spoke with her family over the weekend and she called the office Monday 01/16/2023. She decided she would like to learn more about radiation therapy as an option for treatment. I reached out to Dr. Margaretmary Dys and he said he would consider radiation treatments without tissue diagnosis as patient had been through a biopsy once, and did not want to repeat the procedure if at all possible. He agreed to see her. She was scheduled on  8/27 for an OV with Dr. Kathrynn Running 01/26/2023 to discuss treatment options. .   I spent 40 minutes dedicated to the care of this patient on the date of this encounter to include pre-visit review of records, face-to-face time  with the patient discussing conditions above, post visit ordering of testing, clinical documentation with the electronic health record, making appropriate referrals as documented, and communicating necessary information to the patient's healthcare team.   Bevelyn Ngo, NP 01/13/2023  11:04 AM

## 2023-01-13 NOTE — Telephone Encounter (Signed)
I have called the patient to let her know Dr. Tonia Brooms is in agreement with re-biopsy if this is what she wants to do. She would like to speak with her husband and family before deciding over the weekend. She will call Monday and let us know.

## 2023-01-13 NOTE — Patient Instructions (Addendum)
It is good see you today. You CT scan shows some slow growth of the lung nodule we have been watching. We will be in touch regarding next steps based on what you and I have discussed.  If Dr. Tonia Brooms feels we should consult radiation oncology, we will send you there to learn more about the treatment options. If you are not comfortable with radiation after you have learned more about it, we will plan a repeat biopsy.  Please Work on quitting smoking.  I know this is hard.  Call if you would like to try hypnosis, we can provide a recommendation . We will call you once we have heard from Dr. Tonia Brooms.  Please contact office for sooner follow up if symptoms do not improve or worsen or seek emergency care

## 2023-01-16 ENCOUNTER — Telehealth: Payer: Self-pay | Admitting: *Deleted

## 2023-01-16 NOTE — Telephone Encounter (Signed)
PT was told to call [name] today with the answer to "Does she want to see a oncologist" She states that she does but wonders if she should call them for appt or if they will call her. She needs an afternoon appt.   [phone]

## 2023-01-18 ENCOUNTER — Telehealth: Payer: Self-pay | Admitting: Acute Care

## 2023-01-18 NOTE — Telephone Encounter (Signed)
I have called the patient to let her know Dr. Kathrynn Running is willing to consider SBRT to the slowly growing pulmonary nodule in her left upper lobe. She told me radiation oncology has already called and she has an appointment with Dr. Kathrynn Running on 01/26/2023.  I am greatly appreciative to radiation oncology for their assistance in managing the  care of this patient.

## 2023-01-18 NOTE — Telephone Encounter (Signed)
See telephone note from 01/18/23

## 2023-01-24 ENCOUNTER — Encounter: Payer: Self-pay | Admitting: Acute Care

## 2023-01-24 NOTE — Progress Notes (Signed)
Thoracic Location of Tumor / Histology: Left lung upper lobe  01/06/2023 Kandice Robinsons, NP CT Chest without Contrast CLINICAL DATA: Lung nodule follow-up   IMPRESSION: 1. Slowly enlarging ill-defined subpleural solid nodule of the left upper lobe, remains concerning for primary lung adenocarcinoma. 2. Subsolid nodular opacities of the bilateral lung apices are stable when compared with multiple prior exams and likely due to scarring. 3. Additional scattered small solid pulmonary nodules are stable. 4. No enlarged lymph nodes seen in the chest. 5. Aortic Atherosclerosis (ICD10-I70.0) and Emphysema (ICD10-J43.9).  10/03/2022 Kandice Robinsons, NP CT Chest without Contrast CLINICAL DATA:  Follow-up of pulmonary nodule. Bronchoscopy in February with prior negative biopsy. No history cancer. Current smoker.  IMPRESSION: 1. Primarily similar bilateral upper lobe nodular densities, including a dominant left upper lobe 2.1 cm lesion. Considerations remain 1 or more indolent primary bronchogenic carcinomas or an infectious/inflammatory etiology, including organizing pneumonia. If tissue sampling is not repeated, consider chest CT follow-up at 6 months. 2. Left lung base 6 mm nodular density is new since the prior exam and warrants follow-up attention. 3. Aortic atherosclerosis (ICD10-I70.0) and emphysema (ICD10-J43.9).  06/28/2022 Dr. Audie Box DG Chest Port 1 View CLINICAL DATA: [id number] S/P bronchoscopy with biopsy [id number]   IMPRESSION: 1. No significant pneumothorax. 2. Increased heterogeneous bibasilar opacities with increased reticular nodularity. Differential considerations include atelectasis, aspiration or less likely infection. 3. Marked gaseous distension of the stomach.   Tobacco/Marijuana/Snuff/ETOH use: Tobacco smoker, not currently drinking alcohol, and no drug use.  Past/Anticipated interventions by cardiothoracic surgery, if any:   06/28/2022 Dr. Elige Radon  Icard Flexible video fiberoptic bronchoscopy with robotic assistance   Past/Anticipated interventions by medical oncology, if any: NA  Signs/Symptoms Weight changes, if any: {:18581} Respiratory complaints, if any: {:18581} Hemoptysis, if any: {:18581} Pain issues, if any:  {:18581}  SAFETY ISSUES: Prior radiation? {:18581} Pacemaker/ICD? {:18581}  Possible current pregnancy? Hysterectomy Is the patient on methotrexate? No  Current Complaints / other details:

## 2023-01-26 ENCOUNTER — Ambulatory Visit
Admission: RE | Admit: 2023-01-26 | Discharge: 2023-01-26 | Disposition: A | Discharge status: Home / Self Care | Payer: Medicare HMO | Source: Ambulatory Visit | Attending: Radiation Oncology | Admitting: Radiation Oncology

## 2023-01-26 ENCOUNTER — Encounter: Payer: Self-pay | Admitting: Radiation Oncology

## 2023-01-26 VITALS — BP 120/64 | HR 72 | Temp 97.1°F | Resp 18 | Ht 62.0 in | Wt 167.2 lb

## 2023-01-26 DIAGNOSIS — N1831 Chronic kidney disease, stage 3a: Secondary | ICD-10-CM | POA: Insufficient documentation

## 2023-01-26 DIAGNOSIS — K219 Gastro-esophageal reflux disease without esophagitis: Secondary | ICD-10-CM | POA: Diagnosis not present

## 2023-01-26 DIAGNOSIS — R0602 Shortness of breath: Secondary | ICD-10-CM | POA: Diagnosis not present

## 2023-01-26 DIAGNOSIS — C3412 Malignant neoplasm of upper lobe, left bronchus or lung: Secondary | ICD-10-CM | POA: Diagnosis not present

## 2023-01-26 DIAGNOSIS — Z808 Family history of malignant neoplasm of other organs or systems: Secondary | ICD-10-CM | POA: Insufficient documentation

## 2023-01-26 DIAGNOSIS — F419 Anxiety disorder, unspecified: Secondary | ICD-10-CM | POA: Insufficient documentation

## 2023-01-26 DIAGNOSIS — I10 Essential (primary) hypertension: Secondary | ICD-10-CM | POA: Insufficient documentation

## 2023-01-26 DIAGNOSIS — J449 Chronic obstructive pulmonary disease, unspecified: Secondary | ICD-10-CM | POA: Diagnosis not present

## 2023-01-26 DIAGNOSIS — R918 Other nonspecific abnormal finding of lung field: Secondary | ICD-10-CM | POA: Diagnosis not present

## 2023-01-26 DIAGNOSIS — K449 Diaphragmatic hernia without obstruction or gangrene: Secondary | ICD-10-CM | POA: Diagnosis not present

## 2023-01-26 DIAGNOSIS — I341 Nonrheumatic mitral (valve) prolapse: Secondary | ICD-10-CM | POA: Diagnosis not present

## 2023-01-26 DIAGNOSIS — Z803 Family history of malignant neoplasm of breast: Secondary | ICD-10-CM | POA: Insufficient documentation

## 2023-01-26 DIAGNOSIS — Z79899 Other long term (current) drug therapy: Secondary | ICD-10-CM | POA: Insufficient documentation

## 2023-01-26 DIAGNOSIS — Z8612 Personal history of poliomyelitis: Secondary | ICD-10-CM | POA: Diagnosis not present

## 2023-01-26 DIAGNOSIS — R911 Solitary pulmonary nodule: Secondary | ICD-10-CM

## 2023-01-26 DIAGNOSIS — F17210 Nicotine dependence, cigarettes, uncomplicated: Secondary | ICD-10-CM | POA: Insufficient documentation

## 2023-01-26 DIAGNOSIS — J432 Centrilobular emphysema: Secondary | ICD-10-CM | POA: Insufficient documentation

## 2023-01-26 NOTE — Progress Notes (Signed)
Radiation Oncology         [phone]) [phone] ________________________________  Initial outpatient Consultation  Name: [name] MRN: [id number]  Date of Service: 01/26/2023 DOB: 1945/12/02  MV:HQIONGEX, Eilleen Kempf, MD  Icard, Rachel Bo, DO   REFERRING PHYSICIAN: Josephine Igo, DO  DIAGNOSIS: 77 y/o woman with putative Stage IA, NSCLC of the LUL lung    ICD-10-CM   1. 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 female seen at the request of Dr. Tonia Brooms. She is a current smoker and has been followed in the lung cancer screening program with Kandice Robinsons and Dr. Tonia Brooms with known multiple pulmonary nodules that had remained stable until recent. CT CHest in 04/2022 showed an irregular nodular density in the LUL lung had increased in size as compared to prior exam, measuring 9.4 mm currently and previously only 3.1 mm. There was also an additional new endobronchial nodule of the RML, felt likely due to mucous plugging. A PET was performed 06/08/22 confirming intense metabolic activity in a 3 cm x 1.3 cm LUL nodule but no other hypermetabolic nodules or lymph nodes. She subsequently underwent bronchoscopy with biopsy on 06/28/22 which was non-diagnostic. A repeat CT Chest in 09/2022 showed the LUL nodule to be stable in appearance but there was a new 6 mm basilar nodule at the left costovertebral angle. Follow up imaging on 01/06/23 showed the LLL nodule and others to be stable but interval enlargement of the LUL nodule without any enlarged lymph nodes. Dr. Tonia Brooms offered to repeat bronchoscopy but patient declined and was therefore kindly referred to [place] today to discuss stereotactic body radiotherapy (SBRT) treatment without tissue confirmation given all of the clinical findings outlined above that correlate with an early stage NSCLC.  Her daughter is here with her for today's visit.  PREVIOUS  RADIATION THERAPY: No  PAST MEDICAL HISTORY:  Past Medical History:  Diagnosis Date   Anxiety    Atherosclerosis    Bronchitis due to tobacco use    quit smoking 3 months ago   Chronic kidney disease    stage 3a, patient has kidney cyst   Chronic sciatica of left side 04/01/2015   COPD (chronic obstructive pulmonary disease) (HCC)    Depression    Dyspnea    occasional   GERD (gastroesophageal reflux disease)    no current problems and no meds   Hypertension    Mitral valve prolapse    years ago   Pneumonia    years ago   Polio    age 10 years old   Pre-diabetes    no meds      PAST SURGICAL HISTORY: Past Surgical History:  Procedure Laterality Date   ABDOMINAL HYSTERECTOMY  1985   BACK SURGERY     BRONCHIAL BIOPSY  06/28/2022   Procedure: BRONCHIAL BIOPSIES;  Surgeon: Josephine Igo, DO;  Location: [facility];  Service: Pulmonary;;   BRONCHIAL BRUSHINGS  06/28/2022   Procedure: BRONCHIAL BRUSHINGS;  Surgeon: Josephine Igo, DO;  Location: [facility];  Service: Pulmonary;;   COLONOSCOPY  2017   EYE SURGERY     cataracts removed bilateral   left leg surgery Left    LUMBAR LAMINECTOMY Left 02/21/2014   Procedure: Left L5-S1 Hemilaminectomy, Lateral Recess Decompression, Removal Synovial Cyst;  Surgeon: Eldred Manges, MD;  Location: [facility] OR;  Service: Orthopedics;  Laterality: Left;   MICROLARYNGOSCOPY N/A 10/11/2018   Procedure: MICROLARYNGOSCOPY with Biopsy;  Surgeon: Serena Colonel, MD;  Location: [facility] OR;  Service: ENT;  Laterality: N/A;   polio Bilateral 1950's   surgery on feet and legs for polio   TONSILLECTOMY      FAMILY HISTORY:  Family History  Problem Relation Age of Onset   Cancer Mother        thyroid   Heart attack Father    Cancer Sister        breast   Kidney disease Sister        ESRD   Irritable bowel syndrome Sister    Cancer Sister        thyroid   Colon cancer Neg Hx    Esophageal cancer Neg Hx     SOCIAL HISTORY:  Social History    Socioeconomic History   Marital status: Married    Spouse name: [name]   Number of children: 1   Years of education: [employer] education level: Not on file  Occupational History   Occupation: Retired    Associate Professor: OTHER  Tobacco Use   Smoking status: Every Day    Current packs/day: 1.50    Average packs/day: 1.5 packs/day for 30.0 years (45.0 ttl pk-yrs)    Types: Cigarettes   Smokeless tobacco: Never  Vaping Use   Vaping status: Never Used  Substance and Sexual Activity   Alcohol use: Not Currently    Comment: occasional   Drug use: No   Sexual activity: Not Currently    Birth control/protection: Surgical    Comment: Hysterectomy  Other Topics Concern   Not on file  Social History Narrative   Patient lives at home with her spouse.   Caffeine Use: daily   Social Determinants of Health   Financial Resource Strain: Low Risk  (10/11/2022)   Overall Financial Resource Strain (CARDIA)    Difficulty of Paying Living Expenses: Not hard at all  Food Insecurity: No Food Insecurity (01/26/2023)   Hunger Vital Sign    Worried About Running Out of Food in the Last Year: Never true    Ran Out of Food in the Last Year: Never true  Transportation Needs: No Transportation Needs (01/26/2023)   PRAPARE - Administrator, Civil Service (Medical): No    Lack of Transportation (Non-Medical): No  Physical Activity: Insufficiently Active (10/11/2022)   Exercise Vital Sign    Days of Exercise per Week: 5 days    Minutes of Exercise per Session: 10 min  Stress: Stress Concern Present (10/11/2022)   [employer] of Occupational Health - Occupational Stress Questionnaire    Feeling of Stress : Rather much  Social Connections: Moderately Isolated (10/11/2022)   Social Connection and Isolation Panel [NHANES]    Frequency of Communication with Friends and Family: Once a week    Frequency of Social Gatherings with Friends and Family: Never    Attends Religious Services: More  than 4 times per year    Active Member of [employer] or Organizations: No    Attends Banker Meetings: Never    Marital Status: Married  Catering manager Violence: Not At Risk (01/26/2023)   Humiliation, Afraid, Rape, and Kick questionnaire    Fear of Current or Ex-Partner: No    Emotionally Abused: No    Physically Abused: No    Sexually Abused: No    ALLERGIES: Amlodipine, Rosuvastatin, Sulfa antibiotics, Wellbutrin [bupropion], and Lisinopril  MEDICATIONS:  Current  Outpatient Medications  Medication Sig Dispense Refill   aspirin 325 MG tablet Take 325 mg by mouth as needed for mild pain or headache.     BIOTIN PO Take 2 capsules by mouth in the morning.     diazepam (VALIUM) 5 MG tablet Take 5-15 mg by mouth daily as needed for anxiety.     DULoxetine (CYMBALTA) 60 MG capsule Take 1 capsule (60 mg total) by mouth daily. 90 capsule 1   Homeopathic Products (LEG CRAMP RELIEF) SUBL Place 1 tablet under the tongue daily as needed (leg cramps). [employer]     hydrochlorothiazide (HYDRODIURIL) 25 MG tablet Take 25 mg by mouth daily.     Magnesium Bisglycinate (MAG GLYCINATE) 100 MG TABS Take 300 mg by mouth at bedtime.     zolpidem (AMBIEN) 5 MG tablet Take 1 tablet (5 mg total) by mouth at bedtime as needed for sleep. 15 tablet 1   atorvastatin (LIPITOR) 40 MG tablet Take 40 mg by mouth daily. (Patient not taking: Reported on 01/26/2023)     Current Facility-Administered Medications  Medication Dose Route Frequency Provider Last Rate Last Admin   0.9 %  sodium chloride infusion  500 mL Intravenous Once Cirigliano, Vito V, DO        REVIEW OF SYSTEMS:  On review of systems, the patient reports that she is doing well overall. She denies any chest pain, shortness of breath, cough, hemoptysis, fevers, chills, night sweats, or unintended weight changes. She denies any bowel or bladder disturbances, and denies abdominal pain, nausea or vomiting. She denies any new musculoskeletal or  joint aches or pains. A complete review of systems is obtained and is otherwise negative.    PHYSICAL EXAM:  Wt Readings from Last 3 Encounters:  01/26/23 167 lb 4 oz (75.9 kg)  01/13/23 162 lb (73.5 kg)  10/11/22 166 lb (75.3 kg)   Temp Readings from Last 3 Encounters:  01/26/23 (!) 97.1 F (36.2 C) (Temporal)  01/13/23 (!) 97.1 F (36.2 C) (Temporal)  10/11/22 98 F (36.7 C) (Oral)   BP Readings from Last 3 Encounters:  01/26/23 120/64  01/13/23 122/64  10/11/22 132/84   Pulse Readings from Last 3 Encounters:  01/26/23 72  01/13/23 69  10/11/22 78   Pain Assessment Pain Score: 0-No pain/10  In general this is a well appearing African American woman in no acute distress. She's alert and oriented x4 and appropriate throughout the examination. Cardiopulmonary assessment is negative for acute distress and she exhibits normal effort.    KPS = 100  100 - Normal; no complaints; no evidence of disease. 90   - Able to carry on normal activity; minor signs or symptoms of disease. 80   - Normal activity with effort; some signs or symptoms of disease. 26   - Cares for self; unable to carry on normal activity or to do active work. 60   - Requires occasional assistance, but is able to care for most of his personal needs. 50   - Requires considerable assistance and frequent medical care. 40   - Disabled; requires special care and assistance. 30   - Severely disabled; hospital admission is indicated although death not imminent. 20   - Very sick; hospital admission necessary; active supportive treatment necessary. 10   - Moribund; fatal processes progressing rapidly. 0     - Dead  Karnofsky DA, Abelmann WH, Craver LS and Burchenal [facility] [phone]) The use of the nitrogen mustards in the palliative treatment of  carcinoma: with particular reference to bronchogenic carcinoma Cancer 1 634-56  LABORATORY DATA:  Lab Results  Component Value Date   WBC 6.0 09/13/2022   HGB 12.8 09/13/2022    HCT 38.3 09/13/2022   MCV 90.6 09/13/2022   PLT 260.0 09/13/2022   Lab Results  Component Value Date   NA 139 09/13/2022   K 4.1 09/13/2022   CL 102 09/13/2022   CO2 33 (H) 09/13/2022   Lab Results  Component Value Date   ALT 19 09/13/2022   AST 23 09/13/2022   ALKPHOS 77 09/13/2022   BILITOT 0.4 09/13/2022     RADIOGRAPHY: CT CHEST WO CONTRAST  Result Date: 01/12/2023 CLINICAL DATA:  Lung nodule follow-up EXAM: CT CHEST WITHOUT CONTRAST TECHNIQUE: Multidetector CT imaging of the chest was performed following the standard protocol without IV contrast. RADIATION DOSE REDUCTION: This exam was performed according to the departmental dose-optimization program which includes automated exposure control, adjustment of the mA and/or kV according to patient size and/or use of iterative reconstruction technique. COMPARISON:  Chest CT dated Oct 03, 2022; chest CT dated December 18th 2023; chest CT dated Sep 26, 2018 FINDINGS: Cardiovascular: Normal heart size. No pericardial effusion. Normal caliber thoracic aorta with mild calcified plaque. Mild coronary artery calcifications. 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 Mediastinum/Nodes: Small hiatal hernia. Thyroid is unremarkable. No enlarged lymph nodes seen in the chest. Lungs/Pleura: Central airways are patent. Moderate centrilobular emphysema. Ill-defined subpleural solid nodule of the left upper lobe on series 4, image 44, difficult to measure due to irregularity, largest solid component measures 2.4 x 1.0 cm on series 4 image 45, previously 2.2 x 0.8 cm when remeasured in similar plane. When compared with May 09, 2022 prior nodule measures 1.4 x 0.9 cm at that time. Irregular subsolid nodular opacities are seen in the bilateral lung apices, unchanged when compared with multiple prior exams likely due to scarring. Calcified nodule of the right middle lobe, likely a granuloma. Stable scattered small solid pulmonary nodules. Reference nodule  of the left lower lobe measuring 5 mm on series 4, image 72. Upper Abdomen: Large simple appearing cyst of the left kidney, no specific follow-up imaging is necessary. Musculoskeletal: No chest wall mass or suspicious bone lesions identified. IMPRESSION: 1. Slowly enlarging ill-defined subpleural solid nodule of the left upper lobe, remains concerning for primary lung adenocarcinoma. 2. Subsolid nodular opacities of the bilateral lung apices are stable when compared with multiple prior exams and likely due to scarring. 3. Additional scattered small solid pulmonary nodules are stable. 4. No enlarged lymph nodes seen in the chest. 5. Aortic Atherosclerosis (ICD10-I70.0) and Emphysema (ICD10-J43.9). Electronically Signed   By: Allegra Lai M.D.   On: 01/12/2023 15:32      IMPRESSION/PLAN: 1. 77 y.o. woman with putative Stage IA, NSCLC of the LUL lung. Today, we talked to the patient and her daughter about the findings and workup thus far. The LUL lung nodule has radiographic features consistent with an adenocarcinoma and the nodule has continued to gradually increase in size of the past year. We discussed the natural history of non-small cell lung cancers and general treatment, highlighting the role of stereotactic body radiotherapy (SBRT) in the management of early stage disease. We discussed the available radiation techniques, and focused on the details and logistics of delivery. The recommendation is for a 3-5 fraction course of SBRT to the LUL lung nodule. We reviewed the anticipated acute and late sequelae associated with radiation in this setting. The patient  was encouraged to ask questions that were answered to her stated satisfaction.  At the conclusion of our conversation, the patient is in agreement to proceed with the recommended 3-5 fraction course of SBRT to the LUL lung nodule. She is tentatively scheduled for CT SIM at 1:30pm on Tuesday 01/31/23 and will sign formal, written consent with Dr.  Kathrynn Running at that time. She and her daughter appear to have a good understanding of her disease and our recommendations which are of curative intent and are comfortable and in agreement with the stated plan. Therefore, we will share our discussion with Dr. Tonia Brooms and proceed with treatment planning accordingly. We enjoyed meeting her and her daughter today and look forward to continuing to participate in her care. They know that they are welcome to call with any questions or concerns in the interim.  We personally spent 60 minutes in this encounter including chart review, reviewing radiological studies, meeting face-to-face with the patient, entering orders and completing documentation.    Marguarite Arbour, PA-C    Margaretmary Dys, MD  [facility]  Radiation Oncology Direct Dial: [phone]  Fax: [phone] [email or web address]  Skype  LinkedIn

## 2023-01-30 DIAGNOSIS — C3412 Malignant neoplasm of upper lobe, left bronchus or lung: Secondary | ICD-10-CM | POA: Diagnosis not present

## 2023-01-30 DIAGNOSIS — F17210 Nicotine dependence, cigarettes, uncomplicated: Secondary | ICD-10-CM | POA: Diagnosis not present

## 2023-01-31 ENCOUNTER — Other Ambulatory Visit: Payer: Self-pay

## 2023-01-31 ENCOUNTER — Inpatient Hospital Stay: Payer: Medicare HMO | Attending: Radiation Oncology | Admitting: Licensed Clinical Social Worker

## 2023-01-31 ENCOUNTER — Ambulatory Visit
Admission: RE | Admit: 2023-01-31 | Discharge: 2023-01-31 | Disposition: A | Discharge status: Home / Self Care | Payer: Medicare HMO | Source: Ambulatory Visit | Attending: Radiation Oncology | Admitting: Radiation Oncology

## 2023-01-31 DIAGNOSIS — C3412 Malignant neoplasm of upper lobe, left bronchus or lung: Secondary | ICD-10-CM | POA: Diagnosis not present

## 2023-01-31 DIAGNOSIS — Z510 Encounter for antineoplastic radiation therapy: Secondary | ICD-10-CM | POA: Diagnosis not present

## 2023-01-31 DIAGNOSIS — F17210 Nicotine dependence, cigarettes, uncomplicated: Secondary | ICD-10-CM | POA: Diagnosis not present

## 2023-01-31 NOTE — Progress Notes (Signed)
[facility] Clinical Social Work  Initial Assessment   [name] is a 77 y.o. year old female contacted by phone. Clinical Social Work was referred by medical provider for assessment of psychosocial needs.   SDOH (Social Determinants of Health) assessments performed: Yes SDOH Interventions    Flowsheet Row Clinical Support [facility]31/20[facility] in [facility] Clinical Support from 10/11/2022 in [facility] [place]  SDOH Interventions    Food Insecurity Interventions -- Intervention Not Indicated  Housing Interventions -- Intervention Not Indicated  Transportation Interventions -- Intervention Not Indicated  Utilities Interventions -- Intervention Not Indicated  Alcohol Usage Interventions -- Intervention Not Indicated (Score <7)  Financial Strain Interventions Financial Counselor Intervention Not Indicated  Physical Activity Interventions -- Patient Declined, Intervention Not Indicated  Stress Interventions Intervention Not Indicated Patient Declined  Social Connections Interventions -- Intervention Not Indicated, Patient Declined       SDOH Screenings   Food Insecurity: No Food Insecurity (01/26/2023)  Housing: Low Risk  (01/26/2023)  Transportation Needs: No Transportation Needs (01/26/2023)  Utilities: Not At Risk (01/26/2023)  Alcohol Screen: Low Risk  (01/26/2023)  Depression (PHQ2-9): Medium Risk (01/26/2023)  Financial Resource Strain: High Risk (01/31/2023)  Physical Activity: Insufficiently Active (10/11/2022)  Social Connections: Moderately Isolated (10/11/2022)  Stress: No Stress Concern Present (01/31/2023)  Tobacco Use: High Risk (01/26/2023)     Distress Screen completed: No     No data to display            Family/Social Information:  Housing Arrangement: patient lives with her husband [name].  Both are retired and independent in ADLs. Family members/support persons in your life? Pt has a daughter, granddaughter,  niece and nephew who reside locally and can provided some limited assistance as needed.   Transportation concerns: Pt plans to drive herself to treatment, but is aware of transportation services should she not be able to.  Employment: Retired .  Income source: Actor concerns:  Pt lives on a fixed income and is concerned about the out of pocket costs for radiation.  Type of concern: Medical bills Food access concerns: no Religious or spiritual practice: Yes-[employer] Currently in place:  none  Coping/ Adjustment to diagnosis: Patient understands treatment plan and what happens next? yes Concerns about diagnosis and/or treatment: Overwhelmed by information Patient reported stressors: Depression and Adjusting to my illness Hopes and/or priorities: Pt's priority is to start treatment w/ the hope of positive results.   Patient enjoys time with family/ friends Current coping skills/ strengths: Capable of independent living , Motivation for treatment/growth , Physical Health , and Work skills     SUMMARY: Current SDOH Barriers:  Financial constraints related to fixed income  Clinical Social Work Clinical Goal(s):  Explore community resource options for unmet needs related to:  Financial Strain   Interventions: Discussed common feeling and emotions when being diagnosed with cancer, and the importance of support during treatment Informed patient of the support team roles and support services at [facility] Provided CSW contact information and encouraged patient to call with any questions or concerns Referred patient to Dance movement psychotherapist.  Pt states she has experienced depression most of her life which she manages with medication.  Pt has utilized counseling at different points in her life which she found helpful.  Pt aware she can reach out to CSW for counseling throughout duration of treatment if it would be beneficial.     Follow Up  Plan: Patient  will contact CSW with any support or resource needs Patient verbalizes understanding of plan: Yes    Rachel Moulds, LCSW Clinical Social Worker [facility]

## 2023-01-31 NOTE — Progress Notes (Signed)
  Radiation Oncology         [phone] ________________________________  Name: [name] MRN: [id number]  Date: 01/31/2023  DOB: 05/03/1946  STEREOTACTIC BODY RADIOTHERAPY SIMULATION AND TREATMENT PLANNING NOTE    ICD-10-CM   1. 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)  C34.12       DIAGNOSIS:  77 y/o woman with putative Stage IA, NSCLC of the LUL lung   NARRATIVE:  The patient was brought to the CT Simulation planning suite.  Identity was confirmed.  All relevant records and images related to the planned course of therapy were reviewed.  The patient freely provided informed written consent to proceed with treatment after reviewing the details related to the planned course of therapy. The consent form was witnessed and verified by the simulation staff.  Then, the patient was set-up in a stable reproducible  supine position for radiation therapy.  A BodyFix immobilization pillow was fabricated for reproducible positioning.  Then I personally applied the abdominal compression paddle to limit respiratory excursion.  4D respiratoy motion management CT images were obtained.  Surface markings were placed.  The CT images were loaded into the planning software.  Then, using Cine, MIP, and standard views, the internal target volume (ITV) and planning target volumes (PTV) were delinieated, and avoidance structures were contoured.  Treatment planning then occurred.  The radiation prescription was entered and confirmed.  A total of two complex treatment devices were fabricated in the form of the BodyFix immobilization pillow and a neck accuform cushion.  I have requested : 3D Simulation  I have requested a DVH of the following structures: Heart, Lungs, Esophagus, Chest Wall, Brachial Plexus, Major Blood Vessels, and targets.  SPECIAL TREATMENT PROCEDURE:  The planned course of therapy using radiation constitutes a special treatment procedure. Special care is required in the management of this  patient for the following reasons. This treatment constitutes a Special Treatment Procedure for the following reason: [ High dose per fraction requiring special monitoring for increased toxicities of treatment including daily imaging..  The special nature of the planned course of radiotherapy will require increased physician supervision and oversight to ensure patient's safety with optimal treatment outcomes.  This requires extended time and effort.    RESPIRATORY MOTION MANAGEMENT SIMULATION:  In order to account for effect of respiratory motion on target structures and other organs in the planning and delivery of radiotherapy, this patient underwent respiratory motion management simulation.  To accomplish this, when the patient was brought to the CT simulation planning suite, 4D respiratoy motion management CT images were obtained.  The CT images were loaded into the planning software.  Then, using a variety of tools including Cine, MIP, and standard views, the target volume and planning target volumes (PTV) were delineated.  Avoidance structures were contoured.  Treatment planning then occurred.  Dose volume histograms were generated and reviewed for each of the requested structure.  The resulting plan was carefully reviewed and approved today.  PLAN:  The patient will receive 54 Gy in 3 fraction.  ________________________________  Artist Pais Kathrynn Running, M.D.

## 2023-02-02 ENCOUNTER — Telehealth: Payer: Self-pay | Admitting: Radiation Oncology

## 2023-02-02 NOTE — Telephone Encounter (Signed)
Received voicemail from patient asking for an estimate for her upcoming SBRT treatment and had questions about a $1000 credit.   I returned her call and left her a voicemail that I typically do not handle estimates, that I do prior auths for treatment.  I was however able to find a charge sheet and told her the total cost estimate would be around $90,710 but that she would need to call her insurance company to find out what her portion of that would be(if she has a deductible, out of pocket max, etc)  Also informed her I was not aware of any type of $1000 credit and to maybe check with the billing department about that.

## 2023-02-06 DIAGNOSIS — Z510 Encounter for antineoplastic radiation therapy: Secondary | ICD-10-CM | POA: Diagnosis not present

## 2023-02-06 DIAGNOSIS — F17210 Nicotine dependence, cigarettes, uncomplicated: Secondary | ICD-10-CM | POA: Diagnosis not present

## 2023-02-06 DIAGNOSIS — C3412 Malignant neoplasm of upper lobe, left bronchus or lung: Secondary | ICD-10-CM | POA: Diagnosis not present

## 2023-02-07 ENCOUNTER — Encounter: Payer: Self-pay | Admitting: Radiation Oncology

## 2023-02-07 NOTE — Progress Notes (Signed)
Received call from Radiation patient regarding one-time $1000 [employer].  Based on verbal income guidelines, patient exceeds the income guideline amounts.   Patient was on the phone with insurance company, however not familiar with her OOP max. Encouraged patient to contact them back to determine that amount and where she is so far. She verbalized understanding. Explained she will not receive a bill until treatment is complete and claim has been submitted to insurance and paid. She was very appreciative of the information.  She has my name and number for any additional financial questions or concerns.

## 2023-02-08 ENCOUNTER — Inpatient Hospital Stay: Payer: Medicare HMO | Admitting: Licensed Clinical Social Worker

## 2023-02-08 ENCOUNTER — Ambulatory Visit: Payer: Medicare HMO | Admitting: Radiation Oncology

## 2023-02-08 DIAGNOSIS — C3412 Malignant neoplasm of upper lobe, left bronchus or lung: Secondary | ICD-10-CM

## 2023-02-08 NOTE — Progress Notes (Signed)
[facility] CSW Progress Note  Visual merchandiser  received a call from pt wishing to follow up on supportive services offered.  CSW provided information requested.  Pt verbalized interest in participating in the lung cancer support group.  Pt added to roster and will receive link for next group.  Pt has contact information for CSW to contact should additional needs or questions arise.        Rachel Moulds, LCSW Clinical Social Worker [facility]

## 2023-02-09 ENCOUNTER — Ambulatory Visit: Payer: Medicare HMO | Admitting: Radiation Oncology

## 2023-02-10 ENCOUNTER — Ambulatory Visit: Payer: Medicare HMO | Admitting: Radiation Oncology

## 2023-02-13 ENCOUNTER — Ambulatory Visit
Admission: RE | Admit: 2023-02-13 | Discharge: 2023-02-13 | Disposition: A | Discharge status: Home / Self Care | Payer: Medicare HMO | Source: Ambulatory Visit | Attending: Radiation Oncology

## 2023-02-13 ENCOUNTER — Other Ambulatory Visit: Payer: Self-pay

## 2023-02-13 DIAGNOSIS — C3412 Malignant neoplasm of upper lobe, left bronchus or lung: Secondary | ICD-10-CM | POA: Diagnosis not present

## 2023-02-13 DIAGNOSIS — Z510 Encounter for antineoplastic radiation therapy: Secondary | ICD-10-CM | POA: Diagnosis not present

## 2023-02-13 LAB — RAD ONC ARIA SESSION SUMMARY
Course Elapsed Days: 0
Plan Fractions Treated to Date: 1
Plan Prescribed Dose Per Fraction: 18 Gy
Plan Total Fractions Prescribed: 3
Plan Total Prescribed Dose: 54 Gy
Reference Point Dosage Given to Date: 18 Gy
Reference Point Session Dosage Given: 18 Gy
Session Number: 1

## 2023-02-14 ENCOUNTER — Ambulatory Visit: Payer: Medicare HMO | Admitting: Radiation Oncology

## 2023-02-15 ENCOUNTER — Ambulatory Visit
Admission: RE | Admit: 2023-02-15 | Discharge: 2023-02-15 | Disposition: A | Discharge status: Home / Self Care | Payer: Medicare HMO | Source: Ambulatory Visit | Attending: Radiation Oncology

## 2023-02-15 ENCOUNTER — Other Ambulatory Visit: Payer: Self-pay

## 2023-02-15 DIAGNOSIS — C3412 Malignant neoplasm of upper lobe, left bronchus or lung: Secondary | ICD-10-CM | POA: Diagnosis not present

## 2023-02-15 DIAGNOSIS — Z510 Encounter for antineoplastic radiation therapy: Secondary | ICD-10-CM | POA: Diagnosis not present

## 2023-02-15 LAB — RAD ONC ARIA SESSION SUMMARY
Course Elapsed Days: 2
Plan Fractions Treated to Date: 2
Plan Prescribed Dose Per Fraction: 18 Gy
Plan Total Fractions Prescribed: 3
Plan Total Prescribed Dose: 54 Gy
Reference Point Dosage Given to Date: 36 Gy
Reference Point Session Dosage Given: 18 Gy
Session Number: 2

## 2023-02-17 ENCOUNTER — Ambulatory Visit: Payer: Medicare HMO | Admitting: Radiation Oncology

## 2023-02-21 ENCOUNTER — Ambulatory Visit
Admission: RE | Admit: 2023-02-21 | Discharge: 2023-02-21 | Disposition: A | Discharge status: Home / Self Care | Payer: Medicare HMO | Source: Ambulatory Visit | Attending: Radiation Oncology | Admitting: Radiation Oncology

## 2023-02-21 ENCOUNTER — Other Ambulatory Visit: Payer: Self-pay

## 2023-02-21 DIAGNOSIS — Z510 Encounter for antineoplastic radiation therapy: Secondary | ICD-10-CM | POA: Diagnosis not present

## 2023-02-21 DIAGNOSIS — C3412 Malignant neoplasm of upper lobe, left bronchus or lung: Secondary | ICD-10-CM | POA: Diagnosis not present

## 2023-02-21 DIAGNOSIS — F17210 Nicotine dependence, cigarettes, uncomplicated: Secondary | ICD-10-CM | POA: Diagnosis not present

## 2023-02-21 LAB — RAD ONC ARIA SESSION SUMMARY
Course Elapsed Days: 8
Plan Fractions Treated to Date: 3
Plan Prescribed Dose Per Fraction: 18 Gy
Plan Total Fractions Prescribed: 3
Plan Total Prescribed Dose: 54 Gy
Reference Point Dosage Given to Date: 54 Gy
Reference Point Session Dosage Given: 18 Gy
Session Number: 3

## 2023-02-22 ENCOUNTER — Encounter (HOSPITAL_COMMUNITY): Payer: Self-pay

## 2023-02-22 ENCOUNTER — Telehealth: Payer: Self-pay

## 2023-02-22 ENCOUNTER — Emergency Department (HOSPITAL_COMMUNITY)
Admission: EM | Admit: 2023-02-22 | Discharge: 2023-02-22 | Disposition: A | Discharge status: Home / Self Care | Payer: Medicare HMO | Attending: Emergency Medicine | Admitting: Emergency Medicine

## 2023-02-22 ENCOUNTER — Emergency Department (HOSPITAL_COMMUNITY): Payer: Medicare HMO

## 2023-02-22 ENCOUNTER — Other Ambulatory Visit: Payer: Self-pay

## 2023-02-22 DIAGNOSIS — C3490 Malignant neoplasm of unspecified part of unspecified bronchus or lung: Secondary | ICD-10-CM | POA: Insufficient documentation

## 2023-02-22 DIAGNOSIS — I129 Hypertensive chronic kidney disease with stage 1 through stage 4 chronic kidney disease, or unspecified chronic kidney disease: Secondary | ICD-10-CM | POA: Diagnosis not present

## 2023-02-22 DIAGNOSIS — Z7982 Long term (current) use of aspirin: Secondary | ICD-10-CM | POA: Diagnosis not present

## 2023-02-22 DIAGNOSIS — N1831 Chronic kidney disease, stage 3a: Secondary | ICD-10-CM | POA: Diagnosis not present

## 2023-02-22 DIAGNOSIS — N281 Cyst of kidney, acquired: Secondary | ICD-10-CM | POA: Diagnosis not present

## 2023-02-22 DIAGNOSIS — J449 Chronic obstructive pulmonary disease, unspecified: Secondary | ICD-10-CM | POA: Insufficient documentation

## 2023-02-22 DIAGNOSIS — M546 Pain in thoracic spine: Secondary | ICD-10-CM | POA: Diagnosis not present

## 2023-02-22 DIAGNOSIS — R918 Other nonspecific abnormal finding of lung field: Secondary | ICD-10-CM | POA: Diagnosis not present

## 2023-02-22 DIAGNOSIS — Z79899 Other long term (current) drug therapy: Secondary | ICD-10-CM | POA: Diagnosis not present

## 2023-02-22 DIAGNOSIS — M549 Dorsalgia, unspecified: Secondary | ICD-10-CM | POA: Diagnosis not present

## 2023-02-22 DIAGNOSIS — K5730 Diverticulosis of large intestine without perforation or abscess without bleeding: Secondary | ICD-10-CM | POA: Diagnosis not present

## 2023-02-22 DIAGNOSIS — I10 Essential (primary) hypertension: Secondary | ICD-10-CM | POA: Diagnosis not present

## 2023-02-22 HISTORY — DX: Malignant neoplasm of unspecified part of unspecified bronchus or lung: C34.90

## 2023-02-22 HISTORY — DX: Malignant (primary) neoplasm, unspecified: C80.1

## 2023-02-22 LAB — I-STAT CHEM 8, ED
BUN: 19 mg/dL (ref 8–23)
Calcium, Ion: 1.32 mmol/L (ref 1.15–1.40)
Chloride: 102 mmol/L (ref 98–111)
Creatinine, Ser: 1.2 mg/dL — ABNORMAL HIGH (ref 0.44–1.00)
Glucose, Bld: 100 mg/dL — ABNORMAL HIGH (ref 70–99)
HCT: 39 % (ref 36.0–46.0)
Hemoglobin: 13.3 g/dL (ref 12.0–15.0)
Potassium: 3.4 mmol/L — ABNORMAL LOW (ref 3.5–5.1)
Sodium: 139 mmol/L (ref 135–145)
TCO2: 27 mmol/L (ref 22–32)

## 2023-02-22 LAB — CBC WITH DIFFERENTIAL/PLATELET
Abs Immature Granulocytes: 0.02 10*3/uL (ref 0.00–0.07)
Basophils Absolute: 0 10*3/uL (ref 0.0–0.1)
Basophils Relative: 0 %
Eosinophils Absolute: 0.4 10*3/uL (ref 0.0–0.5)
Eosinophils Relative: 8 %
HCT: 38.2 % (ref 36.0–46.0)
Hemoglobin: 12.7 g/dL (ref 12.0–15.0)
Immature Granulocytes: 0 %
Lymphocytes Relative: 17 %
Lymphs Abs: 0.8 10*3/uL (ref 0.7–4.0)
MCH: 30.5 pg (ref 26.0–34.0)
MCHC: 33.2 g/dL (ref 30.0–36.0)
MCV: 91.6 fL (ref 80.0–100.0)
Monocytes Absolute: 0.6 10*3/uL (ref 0.1–1.0)
Monocytes Relative: 12 %
Neutro Abs: 2.9 10*3/uL (ref 1.7–7.7)
Neutrophils Relative %: 63 %
Platelets: 270 10*3/uL (ref 150–400)
RBC: 4.17 MIL/uL (ref 3.87–5.11)
RDW: 13.8 % (ref 11.5–15.5)
WBC: 4.6 10*3/uL (ref 4.0–10.5)
nRBC: 0 % (ref 0.0–0.2)

## 2023-02-22 LAB — TROPONIN I (HIGH SENSITIVITY)
Troponin I (High Sensitivity): 5 ng/L (ref <18)
Troponin I (High Sensitivity): 5 ng/L (ref <18)

## 2023-02-22 MED ORDER — LIDOCAINE 5 % EX PTCH
1.0000 | MEDICATED_PATCH | CUTANEOUS | Status: DC
  Administered 2023-02-22: 1 via TRANSDERMAL
  Filled 2023-02-22: qty 1

## 2023-02-22 MED ORDER — MORPHINE SULFATE (PF) 2 MG/ML IV SOLN
2.0000 mg | Freq: Once | INTRAVENOUS | Status: AC
  Administered 2023-02-22: 2 mg via INTRAVENOUS
  Filled 2023-02-22: qty 1

## 2023-02-22 MED ORDER — OXYCODONE-ACETAMINOPHEN 5-325 MG PO TABS
1.0000 | ORAL_TABLET | Freq: Four times a day (QID) | ORAL | 0 refills | Status: AC | PRN
Start: 2023-02-22 — End: ?

## 2023-02-22 MED ORDER — IOHEXOL 350 MG/ML SOLN
100.0000 mL | Freq: Once | INTRAVENOUS | Status: AC | PRN
  Administered 2023-02-22: 100 mL via INTRAVENOUS

## 2023-02-22 MED ORDER — SODIUM CHLORIDE (PF) 0.9 % IJ SOLN
INTRAMUSCULAR | Status: AC
  Filled 2023-02-22: qty 50

## 2023-02-22 NOTE — Telephone Encounter (Signed)
RN spoke with Mrs. Welch she wanted to inform staff that she had an emergency room visit last night with complaints of back pain between her shoulder blades and down her left arm.  That test were done and she's alright but the at ER doctor told her to inform her radiation doctor.  Ashlyn Bruning, PA-C made aware of this visit.

## 2023-02-22 NOTE — ED Triage Notes (Signed)
Pt Came to the ED complaining of back pain that radiates to her left arm  for a week on and off.Pain describes as stubbing pain. No known injury, no blood thinners.pain rated 9/10.

## 2023-02-22 NOTE — Discharge Instructions (Signed)
Follow-up with your doctor next week for recheck 

## 2023-02-22 NOTE — Radiation Completion Notes (Addendum)
  Radiation Oncology         [phone] ________________________________  Name: [name] MRN: [id number]  Date: 02/22/2023  DOB: [birth year] Referring Physician: Audie Box, M.D. Date of Service: 2023-02-22 Radiation Oncologist: Margaretmary Bayley, M.D. [facility] [place]     RADIATION ONCOLOGY END OF TREATMENT NOTE     Diagnosis: 77 y/o woman with putative Stage IA, NSCLC of the LUL lung   Intent: Curative     ==========DELIVERED PLANS==========  First Treatment Date: 2023-02-13 - Last Treatment Date: 2023-02-21   Plan Name: Lung_LUL_SBRT Site: Lung, Left Technique: SBRT/SRT-IMRT Mode: Photon Dose Per Fraction: 18 Gy Prescribed Dose (Delivered / Prescribed): 54 Gy / 54 Gy Prescribed Fxs (Delivered / Prescribed): 3 / 3     ==========ON TREATMENT VISIT DATES========== 2023-02-13, 2023-02-15, 2023-02-15, 2023-02-21   See weekly On Treatment Notes in Epic for details.  She tolerated the radiation treatments well, without any acute ill side effects.  The patient will receive a call in about one month from the radiation oncology department.  We will arrange for a posttreatment CT chest scan in approximately 2 to 3 months and she will continue follow up with her pulmonologist, Dr. Tonia Brooms as well.  ------------------------------------------------   Margaretmary Dys, MD [facility]  Radiation Oncology Direct Dial: [phone]  Fax: [phone] [email or web address]  Skype  LinkedIn

## 2023-02-22 NOTE — ED Provider Notes (Signed)
[place] EMERGENCY DEPARTMENT AT [facility] Provider Note   CSN: [id number] Arrival date & time: 02/22/23  0457     History    [name] is a 77 y.o. female.  The history is provided by the patient.  Back Pain Location:  Thoracic spine Quality:  Shooting Radiates to:  L shoulder Pain severity:  Moderate Pain is:  Same all the time Duration:  1 week Timing:  Intermittent Progression:  Waxing and waning Chronicity:  New Context comment:  Radiation to that area Relieved by:  Nothing Worsened by:  Nothing Ineffective treatments:  None tried Associated symptoms: no fever   Associated symptoms comment:  Arm pain Risk factors: hx of cancer   Patient with lung CA currently getting rations presents with upper left back pain with radiation down the LUE. Started a week ago and is coming and going.  No SOB.  No n/v/d.      Past Medical History:  Diagnosis Date   Anxiety    Atherosclerosis    Bronchitis due to tobacco use    quit smoking 3 months ago   Cancer [facility])    Chronic kidney disease    stage 3a, patient has kidney cyst   Chronic sciatica of left side 04/01/2015   COPD (chronic obstructive pulmonary disease) (HCC)    Depression    Dyspnea    occasional   GERD (gastroesophageal reflux disease)    no current problems and no meds   Hypertension    Lung cancer (HCC)    Mitral valve prolapse    years ago   Pneumonia    years ago   Polio    age 49 years old   Pre-diabetes    no meds     Home Medications Prior to Admission medications   Medication Sig Start Date End Date Taking? Authorizing Provider  aspirin 325 MG tablet Take 325 mg by mouth as needed for mild pain or headache.    [provider]  atorvastatin (LIPITOR) 40 MG tablet Take 40 mg by mouth daily. Patient not taking: Reported on 01/26/2023 04/08/16   [provider]  BIOTIN PO Take 2 capsules by mouth in the morning.    [provider]  diazepam (VALIUM)  5 MG tablet Take 5-15 mg by mouth daily as needed for anxiety.    [provider]  DULoxetine (CYMBALTA) 60 MG capsule Take 1 capsule (60 mg total) by mouth daily. 09/24/22   Georgina Quint, MD  Homeopathic Products (LEG CRAMP RELIEF) SUBL Place 1 tablet under the tongue daily as needed (leg cramps). [employer]    [provider]  hydrochlorothiazide (HYDRODIURIL) 25 MG tablet Take 25 mg by mouth daily. 01/24/23   [provider]  Magnesium Bisglycinate (MAG GLYCINATE) 100 MG TABS Take 300 mg by mouth at bedtime.    [provider]  zolpidem (AMBIEN) 5 MG tablet Take 1 tablet (5 mg total) by mouth at bedtime as needed for sleep. 12/28/22   Georgina Quint, MD      Allergies    Amlodipine, Rosuvastatin, Sulfa antibiotics, Wellbutrin [bupropion], and Lisinopril    Review of Systems   Review of Systems  Constitutional:  Negative for fever.  HENT:  Negative for facial swelling.   Eyes:  Negative for redness.  Respiratory:  Negative for wheezing and stridor.   Gastrointestinal:  Negative for vomiting.  Musculoskeletal:  Positive for arthralgias and back pain.  All other systems reviewed and  are negative.   Physical Exam Updated Vital Signs BP (!) 109/59 (BP Location: Left Arm)   Pulse 66   Temp (!) 97.4 F (36.3 C) (Oral)   Resp 17   Ht 5\' 2"  (1.575 m)   Wt 73.5 kg   SpO2 97%   BMI 29.63 kg/m  Physical Exam Vitals and nursing note reviewed.  Constitutional:      General: She is not in acute distress.    Appearance: Normal appearance. She is well-developed.  HENT:     Head: Normocephalic and atraumatic.     Nose: Nose normal.  Eyes:     Pupils: Pupils are equal, round, and reactive to light.  Cardiovascular:     Rate and Rhythm: Normal rate and regular rhythm.     Pulses: Normal pulses.     Heart sounds: Normal heart sounds.  Pulmonary:     Effort: Pulmonary effort is normal. No respiratory distress.     Breath sounds: Normal  breath sounds. No wheezing or rales.  Abdominal:     General: Bowel sounds are normal. There is no distension.     Palpations: Abdomen is soft.     Tenderness: There is no abdominal tenderness. There is no guarding or rebound.  Musculoskeletal:        General: Normal range of motion.     Cervical back: Normal range of motion and neck supple.  Skin:    General: Skin is warm and dry.     Capillary Refill: Capillary refill takes less than 2 seconds.     Findings: No erythema or rash.  Neurological:     General: No focal deficit present.     Mental Status: She is alert and oriented to person, place, and time.     Deep Tendon Reflexes: Reflexes normal.  Psychiatric:        Mood and Affect: Mood normal.     ED Results / Procedures / Treatments   Labs (all labs ordered are listed, but only abnormal results are displayed) Results for orders placed or performed during the hospital encounter of 02/22/23  CBC with Differential  Result Value Ref Range   WBC 4.6 4.0 - 10.5 K/uL   RBC 4.17 3.87 - 5.11 MIL/uL   Hemoglobin 12.7 12.0 - 15.0 g/dL   HCT 45.4 09.8 - 11.9 %   MCV 91.6 80.0 - 100.0 fL   MCH 30.5 26.0 - 34.0 pg   MCHC 33.2 30.0 - 36.0 g/dL   RDW 14.7 82.9 - 56.2 %   Platelets 270 150 - 400 K/uL   nRBC 0.0 0.0 - 0.2 %   Neutrophils Relative % 63 %   Neutro Abs 2.9 1.7 - 7.7 K/uL   Lymphocytes Relative 17 %   Lymphs Abs 0.8 0.7 - 4.0 K/uL   Monocytes Relative 12 %   Monocytes Absolute 0.6 0.1 - 1.0 K/uL   Eosinophils Relative 8 %   Eosinophils Absolute 0.4 0.0 - 0.5 K/uL   Basophils Relative 0 %   Basophils Absolute 0.0 0.0 - 0.1 K/uL   Immature Granulocytes 0 %   Abs Immature Granulocytes 0.02 0.00 - 0.07 K/uL  I-stat chem 8, ED (not at [facility], [facility] or [facility])  Result Value Ref Range   Sodium 139 135 - 145 mmol/L   Potassium 3.4 (L) 3.5 - 5.1 mmol/L   Chloride 102 98 - 111 mmol/L   BUN 19 8 - 23 mg/dL   Creatinine, Ser 1.30 (H) 0.44 - 1.00 mg/dL  Glucose, Bld 100 (H) 70 -  99 mg/dL   Calcium, Ion 9.52 8.41 - 1.40 mmol/L   TCO2 27 22 - 32 mmol/L   Hemoglobin 13.3 12.0 - 15.0 g/dL   HCT 32.4 40.1 - 02.7 %   No results found.   EKG ED ECG REPORT   Date: 02/22/2023  Rate: 61  Rhythm: normal sinus rhythm  QRS Axis: normal  Intervals: normal  ST/T Wave abnormalities: normal  Conduction Disutrbances:none  Narrative Interpretation:   Old EKG Reviewed: none available  I have personally reviewed the EKG tracing and agree with the computerized printout as noted.  Radiology No results found.  Procedures Procedures    Medications Ordered in ED Medications - No data to display  ED Course/ Medical Decision Making/ A&P                                 Medical Decision Making Amount and/or Complexity of Data Reviewed Labs: ordered.    Details: Normal white count 4.6, hemoglobin normal 12.7, normal platelets.  Normal sodium 139, potassium slight low 3.4, creatinine slight elevation 1.2, first troponin 5  Radiology: ordered.    Details: CTA pending  ECG/medicine tests: ordered and independent interpretation performed. Decision-making details documented in ED Course.  Risk Prescription drug management.    Final Clinical Impression(s) / ED Diagnoses  Signed out to Dr. Estell Harpin pending troponins and CTA    Don Tiu, MD 02/22/23 [phone]

## 2023-02-23 ENCOUNTER — Ambulatory Visit: Payer: Medicare HMO | Admitting: Radiation Oncology

## 2023-03-01 ENCOUNTER — Telehealth: Payer: Self-pay | Admitting: Emergency Medicine

## 2023-03-01 DIAGNOSIS — F5104 Psychophysiologic insomnia: Secondary | ICD-10-CM

## 2023-03-01 NOTE — Telephone Encounter (Signed)
Pt called stating she would like to get the 30 day supply of ambien instead of the 15 supply. Please advise.

## 2023-03-02 MED ORDER — ZOLPIDEM TARTRATE 5 MG PO TABS: 5.0000 mg | ORAL_TABLET | Freq: Every evening | ORAL | 0 refills | Status: DC | PRN

## 2023-03-02 NOTE — Telephone Encounter (Signed)
Okay to be a 30-day supply

## 2023-03-02 NOTE — Telephone Encounter (Signed)
Medication faxed to patient pharmacy

## 2023-04-03 ENCOUNTER — Other Ambulatory Visit: Payer: Self-pay | Admitting: Emergency Medicine

## 2023-04-03 DIAGNOSIS — F5104 Psychophysiologic insomnia: Secondary | ICD-10-CM

## 2023-04-06 ENCOUNTER — Other Ambulatory Visit: Payer: Self-pay | Admitting: Urology

## 2023-04-06 DIAGNOSIS — C3412 Malignant neoplasm of upper lobe, left bronchus or lung: Secondary | ICD-10-CM

## 2023-04-11 ENCOUNTER — Ambulatory Visit
Admission: RE | Admit: 2023-04-11 | Discharge: 2023-04-11 | Disposition: A | Discharge status: Home / Self Care | Payer: Medicare HMO | Source: Ambulatory Visit | Attending: Radiation Oncology | Admitting: Radiation Oncology

## 2023-04-11 DIAGNOSIS — C3412 Malignant neoplasm of upper lobe, left bronchus or lung: Secondary | ICD-10-CM | POA: Insufficient documentation

## 2023-04-11 DIAGNOSIS — Z510 Encounter for antineoplastic radiation therapy: Secondary | ICD-10-CM | POA: Insufficient documentation

## 2023-04-11 NOTE — Progress Notes (Signed)
  Radiation Oncology         [phone] ________________________________  Name: [name] MRN: [id number]  Date of Service: 04/11/2023  DOB: 1946-04-18  Post Treatment Telephone Note  Diagnosis:  putative Stage IA, NSCLC of the LUL lung (as documented in provider EOT note)  The patient was available for call today.   Symptoms of fatigue have improved since completing therapy.  Symptoms of skin changes have improved since completing therapy.  Symptoms of esophagitis have improved since completing therapy.   The patient has scheduled follow up with her medical oncologist Dr. Tonia Brooms for ongoing care, and was encouraged to call if she develops concerns or questions regarding radiation.   This concludes the interaction.  Ruel Favors, LPN

## 2023-04-27 ENCOUNTER — Other Ambulatory Visit: Payer: Self-pay | Admitting: Emergency Medicine

## 2023-04-27 DIAGNOSIS — F5104 Psychophysiologic insomnia: Secondary | ICD-10-CM

## 2023-05-02 ENCOUNTER — Telehealth: Payer: Self-pay | Admitting: *Deleted

## 2023-05-02 NOTE — Telephone Encounter (Signed)
CALLED PATIENT TO INFORM OF CT FOR 05-05-23- ARRIVAL TIME- 5:15 PM @ [facility], NO RESTRICTIONS TO SCAN, PATIENT TO RECEIVE CT RESULTS FROM ASHLYN BRUNING ON 05-10-23 @ 10:30 AM VIA TELEPHONE, SPOKE WITH PATIENT AND SHE IS AWARE OF THESE APPTS. AND THE INSTRUCTIONS

## 2023-05-03 ENCOUNTER — Ambulatory Visit: Payer: Medicare HMO | Admitting: Emergency Medicine

## 2023-05-05 ENCOUNTER — Ambulatory Visit (HOSPITAL_COMMUNITY)
Admission: RE | Admit: 2023-05-05 | Discharge: 2023-05-05 | Disposition: A | Discharge status: Home / Self Care | Payer: Medicare HMO | Source: Ambulatory Visit | Attending: Urology | Admitting: Urology

## 2023-05-05 DIAGNOSIS — C3412 Malignant neoplasm of upper lobe, left bronchus or lung: Secondary | ICD-10-CM | POA: Diagnosis not present

## 2023-05-05 DIAGNOSIS — C3490 Malignant neoplasm of unspecified part of unspecified bronchus or lung: Secondary | ICD-10-CM | POA: Diagnosis not present

## 2023-05-05 MED ORDER — IOHEXOL 300 MG/ML  SOLN
75.0000 mL | Freq: Once | INTRAMUSCULAR | Status: AC | PRN
  Administered 2023-05-05: 75 mL via INTRAVENOUS

## 2023-05-09 NOTE — Progress Notes (Addendum)
Telephone nursing appointment for review of most recent CT-CHEST. I verified patient's identity x2 and began nursing interview.   Patient reports/ states-  "I've had severe depression all my life and pain, that I will not speak to anyone about in reference to my health, weather it be good or bad. They might as well not call me and please cancel my apt for this call."   She does not wish to be contacted at all by anyone and will call us if needed.  Patient aware of their 10:30am-05/10/2023 telephone appointment w/ Ashlyn Bruning PA-C. I left my extension [phone] in case patient needs anything. Patient verbalized understanding. This concludes the nursing interview.   Patient contact [phone]    Ruel Favors, LPN

## 2023-05-10 ENCOUNTER — Ambulatory Visit: Payer: Medicare HMO | Admitting: Urology

## 2023-05-10 ENCOUNTER — Telehealth: Payer: Self-pay | Admitting: Urology

## 2023-05-10 ENCOUNTER — Encounter: Payer: Self-pay | Admitting: Emergency Medicine

## 2023-05-10 ENCOUNTER — Ambulatory Visit
Admission: RE | Admit: 2023-05-10 | Discharge: 2023-05-10 | Disposition: A | Discharge status: Home / Self Care | Payer: Medicare HMO | Source: Ambulatory Visit | Attending: Urology | Admitting: Urology

## 2023-05-10 ENCOUNTER — Ambulatory Visit (INDEPENDENT_AMBULATORY_CARE_PROVIDER_SITE_OTHER): Payer: Medicare HMO | Admitting: Emergency Medicine

## 2023-05-10 VITALS — BP 118/80 | HR 78 | Temp 97.9°F | Ht 62.0 in | Wt 169.0 lb

## 2023-05-10 DIAGNOSIS — R7303 Prediabetes: Secondary | ICD-10-CM | POA: Diagnosis not present

## 2023-05-10 DIAGNOSIS — N1831 Chronic kidney disease, stage 3a: Secondary | ICD-10-CM | POA: Diagnosis not present

## 2023-05-10 DIAGNOSIS — J439 Emphysema, unspecified: Secondary | ICD-10-CM

## 2023-05-10 DIAGNOSIS — I10 Essential (primary) hypertension: Secondary | ICD-10-CM | POA: Diagnosis not present

## 2023-05-10 DIAGNOSIS — G894 Chronic pain syndrome: Secondary | ICD-10-CM

## 2023-05-10 DIAGNOSIS — K219 Gastro-esophageal reflux disease without esophagitis: Secondary | ICD-10-CM

## 2023-05-10 DIAGNOSIS — F5104 Psychophysiologic insomnia: Secondary | ICD-10-CM

## 2023-05-10 DIAGNOSIS — F17200 Nicotine dependence, unspecified, uncomplicated: Secondary | ICD-10-CM

## 2023-05-10 MED ORDER — HYDROCHLOROTHIAZIDE 25 MG PO TABS: 25.0000 mg | ORAL_TABLET | Freq: Every day | ORAL | 3 refills | Status: DC

## 2023-05-10 NOTE — Assessment & Plan Note (Signed)
1 to 2 packs/day for many years. Not motivated to quit Cardiovascular and cancer risk associated with smoking discussed Smoking cessation advice given.

## 2023-05-10 NOTE — Assessment & Plan Note (Signed)
Takes Ambien as needed 

## 2023-05-10 NOTE — Assessment & Plan Note (Signed)
Continues to smoke.  Stable. Not using any inhalers at present time

## 2023-05-10 NOTE — Assessment & Plan Note (Signed)
Diet and nutrition discussed. 

## 2023-05-10 NOTE — Assessment & Plan Note (Signed)
Chronic stable condition. Advised to stay well-hydrated and avoid NSAIDs

## 2023-05-10 NOTE — Assessment & Plan Note (Addendum)
Well-controlled hypertension Continue hydrochlorothiazide 25 mg   BP Readings from Last 3 Encounters:  05/10/23 118/80  02/22/23 (!) 126/57  01/26/23 120/64

## 2023-05-10 NOTE — Progress Notes (Signed)
[name] 77 y.o.   Chief Complaint  Patient presents with   Medication Refill    Pended and pt has concerns about her oxygen level not sure if it has been low or not     HISTORY OF PRESENT ILLNESS: This is a 77 y.o. female here for follow-up of hypertension and COPD Still smoking.  Needs medication refill. No other complaints or medical concerns today. BP Readings from Last 3 Encounters:  02/22/23 (!) 126/57  01/26/23 120/64  01/13/23 122/64     Medication Refill Associated symptoms include coughing. Pertinent negatives include no abdominal pain, chest pain, chills, congestion, fever, headaches, nausea, rash, sore throat or vomiting.     Prior to Admission medications   Medication Sig Start Date End Date Taking? Authorizing Provider  aspirin 325 MG tablet Take 325 mg by mouth as needed for mild pain or headache.    [provider]  atorvastatin (LIPITOR) 40 MG tablet Take 40 mg by mouth daily. Patient not taking: Reported on 01/26/2023 04/08/16   [provider]  BIOTIN PO Take 2 capsules by mouth in the morning.    [provider]  diazepam (VALIUM) 5 MG tablet Take 5-15 mg by mouth daily as needed for anxiety.    [provider]  DULoxetine (CYMBALTA) 60 MG capsule Take 1 capsule (60 mg total) by mouth daily. 09/24/22   Georgina Quint, MD  Homeopathic Products (LEG CRAMP RELIEF) SUBL Place 1 tablet under the tongue daily as needed (leg cramps). [employer]    [provider]  hydrochlorothiazide (HYDRODIURIL) 25 MG tablet Take 25 mg by mouth daily. 01/24/23   [provider]  Magnesium Bisglycinate (MAG GLYCINATE) 100 MG TABS Take 300 mg by mouth at bedtime.    [provider]  oxyCODONE-acetaminophen (PERCOCET) 5-325 MG tablet Take 1 tablet by mouth every 6 (six) hours as needed. 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) 06/09/2022   Other fatigue 12/21/2021   DOE (dyspnea on exertion) 12/21/2021   Positive urine drug screen 06/26/2020   Prediabetes 06/19/2020   CKD (chronic kidney disease) stage 3, GFR 30-59 ml/min (HCC) 06/19/2020   Severe episode of recurrent major depressive disorder, without psychotic features (HCC) 06/18/2020   Irritable bowel syndrome with diarrhea 06/17/2020   Late effects of poliomyelitis 06/17/2020   Chronic obstructive pulmonary disease, unspecified (HCC) 06/17/2020   Chronic insomnia 03/24/2020   Proteinuria 03/18/2020   Essential hypertension 03/03/2020   Gastroesophageal reflux disease without esophagitis 03/03/2020   Hyperlipidemia LDL goal <130 03/03/2020   Smokes 2 packs of cigarettes per day 05/02/2017   Pharyngoesophageal dysphagia 05/02/2017   Laryngopharyngeal reflux (LPR) 05/02/2017   Hoarseness 05/02/2017   Chronic sciatica of left side 04/01/2015   HNP (herniated nucleus pulposus), lumbar 02/21/2014   Synovial  cyst of lumbar spine 02/21/2014   Lumbar foraminal stenosis 02/21/2014    Past Medical History:  Diagnosis Date   Anxiety    Atherosclerosis    Bronchitis due to tobacco use    quit smoking 3 months ago   Cancer [facility])    Chronic kidney disease    stage 3a, patient has kidney cyst   Chronic sciatica of left side 04/01/2015   COPD (chronic obstructive pulmonary disease) (HCC)    Depression    Dyspnea    occasional   GERD  (gastroesophageal reflux disease)    no current problems and no meds   Hypertension    Lung cancer (HCC)    Mitral valve prolapse    years ago   Pneumonia    years ago   Polio    age 41 years old   Pre-diabetes    no meds    Past Surgical History:  Procedure Laterality Date   ABDOMINAL HYSTERECTOMY  1985   BACK SURGERY     BRONCHIAL BIOPSY  06/28/2022   Procedure: BRONCHIAL BIOPSIES;  Surgeon: Josephine Igo, DO;  Location: [facility];  Service: Pulmonary;;   BRONCHIAL BRUSHINGS  06/28/2022   Procedure: BRONCHIAL BRUSHINGS;  Surgeon: Josephine Igo, DO;  Location: [facility];  Service: Pulmonary;;   COLONOSCOPY  2017   EYE SURGERY     cataracts removed bilateral   left leg surgery Left    LUMBAR LAMINECTOMY Left 02/21/2014   Procedure: Left L5-S1 Hemilaminectomy, Lateral Recess Decompression, Removal Synovial Cyst;  Surgeon: Eldred Manges, MD;  Location: [facility] OR;  Service: Orthopedics;  Laterality: Left;   MICROLARYNGOSCOPY N/A 10/11/2018   Procedure: MICROLARYNGOSCOPY with Biopsy;  Surgeon: Serena Colonel, MD;  Location: [facility] OR;  Service: ENT;  Laterality: N/A;   polio Bilateral 1950's   surgery on feet and legs for polio   TONSILLECTOMY      Social History   Socioeconomic History   Marital status: Married    Spouse name: [name]   Number of children: 1   Years of education: [employer] education level: Some college, no degree  Occupational History   Occupation: Retired    Associate Professor: OTHER  Tobacco Use   Smoking status: Every Day    Current packs/day: 1.50    Average packs/day: 1.5 packs/day for 30.0 years (45.0 ttl pk-yrs)    Types: Cigarettes   Smokeless tobacco: Never  Vaping Use   Vaping status: Never Used  Substance and Sexual Activity   Alcohol use: Not Currently    Comment: occasional   Drug use: No   Sexual activity: Not Currently    Birth control/protection: Surgical    Comment: Hysterectomy  Other Topics Concern   Not on file  Social  History Narrative   Patient lives at home with her spouse.   Caffeine Use: daily   Social Drivers of Health   Financial Resource Strain: Low Risk  (05/05/2023)   Overall Financial Resource Strain (CARDIA)    Difficulty of Paying Living Expenses: Not very hard  Food Insecurity: No Food Insecurity (05/05/2023)   Hunger Vital Sign    Worried About Running Out of Food in the Last Year: Never true    Ran Out of Food in the Last Year: Never true  Transportation Needs: No Transportation Needs (05/05/2023)   PRAPARE - Administrator, Civil Service (Medical): No    Lack of Transportation (Non-Medical): No  Physical Activity: Inactive (05/05/2023)   Exercise Vital  Sign    Days of Exercise per Week: 0 days    Minutes of Exercise per Session: 10 min  Stress: Stress Concern Present (05/05/2023)   [employer] of Occupational Health - Occupational Stress Questionnaire    Feeling of Stress : Rather much  Social Connections: Unknown (05/05/2023)   Social Connection and Isolation Panel [NHANES]    Frequency of Communication with Friends and Family: Once a week    Frequency of Social Gatherings with Friends and Family: Patient declined    Attends Religious Services: More than 4 times per year    Active Member of [employer] or Organizations: Yes    Attends Banker Meetings: 1 to 4 times per year    Marital Status: Married  Catering manager Violence: Not At Risk (01/26/2023)   Humiliation, Afraid, Rape, and Kick questionnaire    Fear of Current or Ex-Partner: No    Emotionally Abused: No    Physically Abused: No    Sexually Abused: No    Family History  Problem Relation Age of Onset   Cancer Mother        thyroid   Heart attack Father    Cancer Sister        breast   Kidney disease Sister        ESRD   Irritable bowel syndrome Sister    Cancer Sister        thyroid   Colon cancer Neg Hx    Esophageal cancer Neg Hx      Review of Systems  Constitutional:  Negative.  Negative for chills and fever.  HENT:  Negative for congestion and sore throat.   Respiratory:  Positive for cough. Negative for shortness of breath.   Cardiovascular: Negative.  Negative for chest pain and palpitations.  Gastrointestinal:  Negative for abdominal pain, diarrhea, nausea and vomiting.  Genitourinary: Negative.  Negative for dysuria.  Skin: Negative.  Negative for rash.  Neurological: Negative.  Negative for dizziness and headaches.  All other systems reviewed and are negative.   Today's Vitals   05/10/23 1323  BP: 118/80  Pulse: 78  Temp: 97.9 F (36.6 C)  TempSrc: Oral  SpO2: 99%  Weight: 169 lb (76.7 kg)  Height: 5\' 2"  (1.575 m)   Body mass index is 30.91 kg/m.   Physical Exam Vitals reviewed.  Constitutional:      Appearance: Normal appearance.  HENT:     Head: Normocephalic.  Eyes:     Extraocular Movements: Extraocular movements intact.  Cardiovascular:     Rate and Rhythm: Normal rate.  Pulmonary:     Effort: Pulmonary effort is normal.  Skin:    General: Skin is warm and dry.  Neurological:     Mental Status: She is alert and oriented to person, place, and time.  Psychiatric:        Mood and Affect: Mood normal.        Behavior: Behavior normal.      ASSESSMENT & PLAN: A total of 44 minutes was spent with the patient and counseling/coordination of care regarding preparing for this visit, review of most recent office visit notes, review of multiple chronic medical conditions and their management, review of all medications, review of most recent bloodwork results, review of health maintenance items, education on nutrition, prognosis, documentation, and need for follow up.   Problem List Items Addressed This Visit       Cardiovascular and Mediastinum   Essential hypertension - Primary   Well-controlled hypertension  Continue hydrochlorothiazide 25 mg   BP Readings from Last 3 Encounters:  05/10/23 118/80  02/22/23 (!) 126/57   01/26/23 120/64         Relevant Medications   hydrochlorothiazide (HYDRODIURIL) 25 MG tablet     Respiratory   Chronic obstructive pulmonary disease, unspecified (HCC)   Continues to smoke.  Stable. Not using any inhalers at present time          Digestive   Gastroesophageal reflux disease without esophagitis   Chronic stable condition.  No concerns.  Asymptomatic.        Genitourinary   CKD (chronic kidney disease) stage 3, GFR 30-59 ml/min (HCC)   Chronic stable condition Advised to stay well-hydrated and avoid NSAIDs        Other   Prediabetes   Diet and nutrition discussed      Chronic insomnia   Takes Ambien as needed      Chronic pain syndrome   Secondary to chronic back issues. Sees orthopedist Dr. Ophelia Charter on a regular basis Pain management handled by his office.      Current smoker   1 to 2 packs/day for many years. Not motivated to quit Cardiovascular and cancer risk associated with smoking discussed Smoking cessation advice given.      Patient Instructions  Health Maintenance After Age 84 After age 64, you are at a higher risk for certain long-term diseases and infections as well as injuries from falls. Falls are a major cause of broken bones and head injuries in people who are older than age 67. Getting regular preventive care can help to keep you healthy and well. Preventive care includes getting regular testing and making lifestyle changes as recommended by your health care provider. Talk with your health care provider about: Which screenings and tests you should have. A screening is a test that checks for a disease when you have no symptoms. A diet and exercise plan that is right for you. What should I know about screenings and tests to prevent falls? Screening and testing are the best ways to find a health problem early. Early diagnosis and treatment give you the best chance of managing medical conditions that are common after age 50. Certain  conditions and lifestyle choices may make you more likely to have a fall. Your health care provider may recommend: Regular vision checks. Poor vision and conditions such as cataracts can make you more likely to have a fall. If you wear glasses, make sure to get your prescription updated if your vision changes. Medicine review. Work with your health care provider to regularly review all of the medicines you are taking, including over-the-counter medicines. Ask your health care provider about any side effects that may make you more likely to have a fall. Tell your health care provider if any medicines that you take make you feel dizzy or sleepy. Strength and balance checks. Your health care provider may recommend certain tests to check your strength and balance while standing, walking, or changing positions. Foot health exam. Foot pain and numbness, as well as not wearing proper footwear, can make you more likely to have a fall. Screenings, including: Osteoporosis screening. Osteoporosis is a condition that causes the bones to get weaker and break more easily. Blood pressure screening. Blood pressure changes and medicines to control blood pressure can make you feel dizzy. Depression screening. You may be more likely to have a fall if you have a fear of falling, feel depressed, or feel unable to  do activities that you used to do. Alcohol use screening. Using too much alcohol can affect your balance and may make you more likely to have a fall. Follow these instructions at home: Lifestyle Do not drink alcohol if: Your health care provider tells you not to drink. If you drink alcohol: Limit how much you have to: 0-1 drink a day for women. 0-2 drinks a day for men. Know how much alcohol is in your drink. In the [place] one drink equals one 12 oz bottle of beer (355 mL), one 5 oz glass of wine (148 mL), or one 1 oz glass of hard liquor (44 mL). Do not use any products that contain nicotine or tobacco.  These products include cigarettes, chewing tobacco, and vaping devices, such as e-cigarettes. If you need help quitting, ask your health care provider. Activity  Follow a regular exercise program to stay fit. This will help you maintain your balance. Ask your health care provider what types of exercise are appropriate for you. If you need a cane or walker, use it as recommended by your health care provider. Wear supportive shoes that have nonskid soles. Safety  Remove any tripping hazards, such as rugs, cords, and clutter. Install safety equipment such as grab bars in bathrooms and safety rails on stairs. Keep rooms and walkways well-lit. General instructions Talk with your health care provider about your risks for falling. Tell your health care provider if: You fall. Be sure to tell your health care provider about all falls, even ones that seem minor. You feel dizzy, tiredness (fatigue), or off-balance. Take over-the-counter and prescription medicines only as told by your health care provider. These include supplements. Eat a healthy diet and maintain a healthy weight. A healthy diet includes low-fat dairy products, low-fat (lean) meats, and fiber from whole grains, beans, and lots of fruits and vegetables. Stay current with your vaccines. Schedule regular health, dental, and eye exams. Summary Having a healthy lifestyle and getting preventive care can help to protect your health and wellness after age 21. Screening and testing are the best way to find a health problem early and help you avoid having a fall. Early diagnosis and treatment give you the best chance for managing medical conditions that are more common for people who are older than age 53. Falls are a major cause of broken bones and head injuries in people who are older than age 60. Take precautions to prevent a fall at home. Work with your health care provider to learn what changes you can make to improve your health and wellness  and to prevent falls. This information is not intended to replace advice given to you by your health care provider. Make sure you discuss any questions you have with your health care provider. Document Revised: 09/28/2020 Document Reviewed: 09/28/2020 Elsevier Patient Education  2024 Elsevier Inc.     Edwina Barth, MD [facility]

## 2023-05-10 NOTE — Assessment & Plan Note (Signed)
Secondary to chronic back issues. Sees orthopedist Dr. Lorin Mercy on a regular basis Pain management handled by his office.

## 2023-05-10 NOTE — Patient Instructions (Signed)
Health Maintenance After Age 78 After age 78, you are at a higher risk for certain long-term diseases and infections as well as injuries from falls. Falls are a major cause of broken bones and head injuries in people who are older than age 78. Getting regular preventive care can help to keep you healthy and well. Preventive care includes getting regular testing and making lifestyle changes as recommended by your health care provider. Talk with your health care provider about: Which screenings and tests you should have. A screening is a test that checks for a disease when you have no symptoms. A diet and exercise plan that is right for you. What should I know about screenings and tests to prevent falls? Screening and testing are the best ways to find a health problem early. Early diagnosis and treatment give you the best chance of managing medical conditions that are common after age 78. Certain conditions and lifestyle choices may make you more likely to have a fall. Your health care provider may recommend: Regular vision checks. Poor vision and conditions such as cataracts can make you more likely to have a fall. If you wear glasses, make sure to get your prescription updated if your vision changes. Medicine review. Work with your health care provider to regularly review all of the medicines you are taking, including over-the-counter medicines. Ask your health care provider about any side effects that may make you more likely to have a fall. Tell your health care provider if any medicines that you take make you feel dizzy or sleepy. Strength and balance checks. Your health care provider may recommend certain tests to check your strength and balance while standing, walking, or changing positions. Foot health exam. Foot pain and numbness, as well as not wearing proper footwear, can make you more likely to have a fall. Screenings, including: Osteoporosis screening. Osteoporosis is a condition that causes  the bones to get weaker and break more easily. Blood pressure screening. Blood pressure changes and medicines to control blood pressure can make you feel dizzy. Depression screening. You may be more likely to have a fall if you have a fear of falling, feel depressed, or feel unable to do activities that you used to do. Alcohol use screening. Using too much alcohol can affect your balance and may make you more likely to have a fall. Follow these instructions at home: Lifestyle Do not drink alcohol if: Your health care provider tells you not to drink. If you drink alcohol: Limit how much you have to: 0-1 drink a day for women. 0-2 drinks a day for men. Know how much alcohol is in your drink. In the [place] one drink equals one 12 oz bottle of beer (355 mL), one 5 oz glass of wine (148 mL), or one 1 oz glass of hard liquor (44 mL). Do not use any products that contain nicotine or tobacco. These products include cigarettes, chewing tobacco, and vaping devices, such as e-cigarettes. If you need help quitting, ask your health care provider. Activity  Follow a regular exercise program to stay fit. This will help you maintain your balance. Ask your health care provider what types of exercise are appropriate for you. If you need a cane or walker, use it as recommended by your health care provider. Wear supportive shoes that have nonskid soles. Safety  Remove any tripping hazards, such as rugs, cords, and clutter. Install safety equipment such as grab bars in bathrooms and safety rails on stairs. Keep rooms and walkways   well-lit. General instructions Talk with your health care provider about your risks for falling. Tell your health care provider if: You fall. Be sure to tell your health care provider about all falls, even ones that seem minor. You feel dizzy, tiredness (fatigue), or off-balance. Take over-the-counter and prescription medicines only as told by your health care provider. These include  supplements. Eat a healthy diet and maintain a healthy weight. A healthy diet includes low-fat dairy products, low-fat (lean) meats, and fiber from whole grains, beans, and lots of fruits and vegetables. Stay current with your vaccines. Schedule regular health, dental, and eye exams. Summary Having a healthy lifestyle and getting preventive care can help to protect your health and wellness after age 78. Screening and testing are the best way to find a health problem early and help you avoid having a fall. Early diagnosis and treatment give you the best chance for managing medical conditions that are more common for people who are older than age 78. Falls are a major cause of broken bones and head injuries in people who are older than age 78. Take precautions to prevent a fall at home. Work with your health care provider to learn what changes you can make to improve your health and wellness and to prevent falls. This information is not intended to replace advice given to you by your health care provider. Make sure you discuss any questions you have with your health care provider. Document Revised: 09/28/2020 Document Reviewed: 09/28/2020 Elsevier Patient Education  2024 Elsevier Inc.  

## 2023-05-10 NOTE — Telephone Encounter (Signed)
I called and left a detailed message on the patient's daughter, [name]'s voicemail to let her know that her mom had requested that we cancel today's appointment and requested that our staff not continue to call her regarding her medical care.  I did want the family to know that we had reviewed her posttreatment CT chest scan from 05/05/2023 and this did show a good response to treatment of the LUL lung nodule with a decrease in size but that there was a new, 4 mm rounded LLL lung nodule that is too small to characterize but warrants close monitoring with a follow-up CT chest in 3 months.  I advised that she is welcome to call us back with any questions or concerns regarding this information and/or recommendation and requested that she let us know if her mom is on board with continuing her follow-up with [place] so that we can get the follow-up scan scheduled accordingly.  [name], MMS, PA-C [place]  [facility] [address] Radiation Oncology Physician Assistant Direct Dial: [phone]  Fax: [phone]

## 2023-05-10 NOTE — Assessment & Plan Note (Signed)
Chronic stable condition.  No concerns.  Asymptomatic.

## 2023-05-12 DIAGNOSIS — I10 Essential (primary) hypertension: Secondary | ICD-10-CM | POA: Diagnosis not present

## 2023-05-12 LAB — URINALYSIS, ROUTINE W REFLEX MICROSCOPIC
Bilirubin Urine: NEGATIVE
Hgb urine dipstick: NEGATIVE
Ketones, ur: NEGATIVE
Nitrite: NEGATIVE
Specific Gravity, Urine: 1.015 (ref 1.000–1.030)
Total Protein, Urine: 30 — AB
Urine Glucose: NEGATIVE
Urobilinogen, UA: 0.2 (ref 0.0–1.0)
pH: 6 (ref 5.0–8.0)

## 2023-05-12 LAB — COMPREHENSIVE METABOLIC PANEL
ALT: 12 U/L (ref 0–35)
AST: 20 U/L (ref 0–37)
Albumin: 4 g/dL (ref 3.5–5.2)
Alkaline Phosphatase: 95 U/L (ref 39–117)
BUN: 14 mg/dL (ref 6–23)
CO2: 31 meq/L (ref 19–32)
Calcium: 10 mg/dL (ref 8.4–10.5)
Chloride: 101 meq/L (ref 96–112)
Creatinine, Ser: 1.16 mg/dL (ref 0.40–1.20)
GFR: 45.14 mL/min — ABNORMAL LOW (ref >60.00)
Glucose, Bld: 174 mg/dL — ABNORMAL HIGH (ref 70–99)
Potassium: 4.2 meq/L (ref 3.5–5.1)
Sodium: 139 meq/L (ref 135–145)
Total Bilirubin: 0.5 mg/dL (ref 0.2–1.2)
Total Protein: 6.9 g/dL (ref 6.0–8.3)

## 2023-05-12 LAB — LIPID PANEL
Cholesterol: 273 mg/dL — ABNORMAL HIGH (ref 0–200)
HDL: 45.7 mg/dL (ref >39.00)
LDL Cholesterol: 196 mg/dL — ABNORMAL HIGH (ref 0–99)
NonHDL: 227.58
Total CHOL/HDL Ratio: 6
Triglycerides: 158 mg/dL — ABNORMAL HIGH (ref 0.0–149.0)
VLDL: 31.6 mg/dL (ref 0.0–40.0)

## 2023-05-12 LAB — HEMOGLOBIN A1C: Hgb A1c MFr Bld: 6.3 % (ref 4.6–6.5)

## 2023-05-13 LAB — URINE CULTURE

## 2023-05-15 ENCOUNTER — Encounter: Payer: Self-pay | Admitting: Emergency Medicine

## 2023-05-30 ENCOUNTER — Telehealth: Payer: Self-pay | Admitting: Urology

## 2023-05-30 DIAGNOSIS — C3412 Malignant neoplasm of upper lobe, left bronchus or lung: Secondary | ICD-10-CM

## 2023-05-30 NOTE — Telephone Encounter (Signed)
 I spoke with the patient by telephone today to review the results of her CT chest scan from 05/05/2023 and informed her that this does show a good response to treatment of the LUL lung nodule with a decrease in size but shared that there is a new, small, 4 mm rounded nodule in the LLL that is too small to characterize but warrants close monitoring with a follow-up CT chest in 3 months.  She reports that she has been feeling well and without complaints and is delighted with this news.  She is in agreement to proceed with the follow-up scan in March/April 2025 and pending this scan is stable, we will then move to a 79-month imaging interval until we reach 2 years, at which time we will then move to annual surveillance scans, at her request.  She appears to have a good understanding of our recommendations and is comfortable and in agreement.  I will place the order for her follow-up CT chest scan in March/April 2025 and plan to contact her by telephone thereafter to review the results and recommendations.  She knows that she is welcome to call at anytime in the interim with any questions or concerns.  Sabra MICAEL Rusk, MMS, PA-C [place]  [facility] Radiation Oncology Physician Assistant Direct Dial: [phone]  Fax: [phone]

## 2023-06-01 ENCOUNTER — Other Ambulatory Visit: Payer: Self-pay | Admitting: Emergency Medicine

## 2023-06-01 DIAGNOSIS — F5104 Psychophysiologic insomnia: Secondary | ICD-10-CM

## 2023-06-05 ENCOUNTER — Telehealth: Payer: Self-pay | Admitting: Physical Medicine and Rehabilitation

## 2023-06-05 NOTE — Telephone Encounter (Signed)
 Pt called requesting an appt for back injection. Last injection was 09/2022. Pt asked for call after 10 am. Pt phone number is [phone]

## 2023-06-09 NOTE — Telephone Encounter (Signed)
I left voicemail for patient requesting return call. We need to know how much relief she got from the injection in May, how long it lasted, if the symptoms she has now are the same as before and if there are any new injuries or falls.

## 2023-06-15 DIAGNOSIS — R1314 Dysphagia, pharyngoesophageal phase: Secondary | ICD-10-CM | POA: Diagnosis not present

## 2023-06-15 DIAGNOSIS — F17210 Nicotine dependence, cigarettes, uncomplicated: Secondary | ICD-10-CM | POA: Diagnosis not present

## 2023-06-15 DIAGNOSIS — H6123 Impacted cerumen, bilateral: Secondary | ICD-10-CM | POA: Diagnosis not present

## 2023-06-15 DIAGNOSIS — K219 Gastro-esophageal reflux disease without esophagitis: Secondary | ICD-10-CM | POA: Diagnosis not present

## 2023-06-16 ENCOUNTER — Other Ambulatory Visit: Payer: Self-pay | Admitting: Otolaryngology

## 2023-06-16 DIAGNOSIS — R1314 Dysphagia, pharyngoesophageal phase: Secondary | ICD-10-CM

## 2023-06-16 DIAGNOSIS — K219 Gastro-esophageal reflux disease without esophagitis: Secondary | ICD-10-CM

## 2023-06-20 ENCOUNTER — Ambulatory Visit: Payer: Medicare HMO | Admitting: Orthopaedic Surgery

## 2023-06-27 ENCOUNTER — Ambulatory Visit: Payer: Medicare HMO | Admitting: Orthopaedic Surgery

## 2023-06-27 ENCOUNTER — Encounter: Payer: Self-pay | Admitting: Orthopaedic Surgery

## 2023-06-27 VITALS — BP 109/71 | HR 99 | Ht 62.0 in | Wt 169.0 lb

## 2023-06-27 DIAGNOSIS — M545 Low back pain, unspecified: Secondary | ICD-10-CM | POA: Diagnosis not present

## 2023-06-27 DIAGNOSIS — G8929 Other chronic pain: Secondary | ICD-10-CM

## 2023-06-27 DIAGNOSIS — F17210 Nicotine dependence, cigarettes, uncomplicated: Secondary | ICD-10-CM | POA: Diagnosis not present

## 2023-06-27 DIAGNOSIS — M48061 Spinal stenosis, lumbar region without neurogenic claudication: Secondary | ICD-10-CM | POA: Diagnosis not present

## 2023-06-27 MED ORDER — PREDNISONE 10 MG PO TABS: ORAL_TABLET | ORAL | 0 refills | Status: AC

## 2023-06-27 NOTE — Progress Notes (Signed)
 Office Visit Note   Patient: [name]           Date of Birth: 1946/03/27           MRN: [id number] Visit Date: 06/27/2023              Requested by: Purcell Emil Schanz, MD [address] [place],  [place] [address] PCP: Purcell Emil Schanz, MD   Assessment & Plan: Visit Diagnoses:  1. Lumbar foraminal stenosis   2. Smokes 2 packs of cigarettes per day   3. Lumbosacral pain, chronic     Plan: Deltasone  10 mg 4 tablets daily x 6 days then stop.  Hopefully this helps her shoulder to some degree also.  Treatment options again discussed.  Follow-Up Instructions: No follow-ups on file.   Orders:  No orders of the defined types were placed in this encounter.  Meds ordered this encounter  Medications   predniSONE  (DELTASONE ) 10 MG tablet    Sig: Take 4 tablets daily times 6 days with food    Dispense:  24 tablet    Refill:  0      Procedures: No procedures performed   Clinical Data: No additional findings.   Subjective: Chief Complaint  Patient presents with   Lower Back - Pain    HPI patient returns she is requesting prednisone  again for recurrent problems with her back she had an injection last spring and states it worked well but after several months it came back.  She also had some soreness in her left armpit moving her shoulder and little bit of discomfort in her shoulder blade but still is able to get dressed fix her hair etc.  Back is worse with bending turning twisting.  Patient continues to smoke and had a remote history of polio.  Lumbar MRI showed previous laminectomy on the left at L5-S1 with moderate subarticular and foraminal stenosis worse on the left than right with paracentral disc protrusion L4-5.  Previously 2 level decompression and fusion have been discussed with her but she has not been able to quit smoking.  Review of Systems updated unchanged   Objective: Vital Signs: BP 109/71   Pulse 99   Ht 5' 2 (1.575 m)   Wt 169 lb (76.7  kg)   BMI 30.91 kg/m   Physical Exam Constitutional:      Appearance: She is well-developed.  HENT:     Head: Normocephalic.     Right Ear: External ear normal.     Left Ear: External ear normal. There is no impacted cerumen.  Eyes:     Pupils: Pupils are equal, round, and reactive to light.  Neck:     Thyroid : No thyromegaly.     Trachea: No tracheal deviation.  Cardiovascular:     Rate and Rhythm: Normal rate.  Pulmonary:     Effort: Pulmonary effort is normal.  Abdominal:     Palpations: Abdomen is soft.  Musculoskeletal:     Cervical back: No rigidity.  Skin:    General: Skin is warm and dry.  Neurological:     Mental Status: She is alert and oriented to person, place, and time.  Psychiatric:        Behavior: Behavior normal.     Ortho Exam healed lumbar incision.  Anterior tib gastrocsoleus is active and strong normal walking.  Specialty Comments:  MRI LUMBAR SPINE WITHOUT CONTRAST     TECHNIQUE:  Multiplanar, multisequence MR imaging of the lumbar spine was  performed. No intravenous contrast was administered.     COMPARISON:  Lumbar MRI 05/08/2018     FINDINGS:  Segmentation:  Normal     Alignment:  Mild retrolisthesis L1-2, L2-3, L3-4.     Vertebrae:  Normal bone marrow.  Negative for fracture or mass.     Conus medullaris and cauda equina: Conus extends to the L2 level.  Conus and cauda equina appear normal.     Paraspinal and other soft tissues: Negative for paraspinous mass or  adenopathy     Disc levels:     T11-12: Diffuse disc bulging with mild spinal stenosis     T12-L1: Negative     L1-2: Moderate disc degeneration with disc bulging and endplate  spurring. Mild facet degeneration. Small left-sided disc protrusion  with mild left subarticular stenosis. No interval change.     L2-3: Moderate to advanced disc degeneration with disc space  narrowing, disc bulging and diffuse endplate spurring. Moderate  facet hypertrophy. Mild spinal  stenosis and mild subarticular  stenosis bilaterally unchanged.     L3-4: Diffuse disc bulging and moderate facet hypertrophy. Moderate  spinal stenosis and moderate subarticular stenosis bilaterally. No  interval change.     L4-5: Disc degeneration with diffuse disc bulging. Small left  paracentral disc protrusion is unchanged. Bilateral facet  hypertrophy. Moderate subarticular stenosis bilaterally appears  unchanged.     L5-S1: Left laminotomy is chronic and unchanged. Disc degeneration  with diffuse endplate spurring. Moderate subarticular and foraminal  stenosis on the right. Moderate to severe subarticular and foraminal  stenosis on the left due to spurring is unchanged.     IMPRESSION:  Advanced multilevel disc and facet degeneration throughout the  lumbar spine causing spinal and foraminal stenosis as described  above.     Prior laminectomy left L5-S1. Moderate to severe subarticular and  foraminal stenosis on the left at L5-S1 due to spurring is  unchanged. Left paracentral disc protrusion L4-5 unchanged.     No significant change from the prior MRI 2019.        Electronically Signed    By: Carlin Gaskins M.D.    On: 09/18/2019 11:05  Imaging: No results found.   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) 06/09/2022   Other fatigue 12/21/2021   DOE (dyspnea on exertion) 12/21/2021   Positive urine drug screen 06/26/2020   Prediabetes 06/19/2020   CKD (chronic kidney disease) stage 3, GFR 30-59 ml/min (HCC) 06/19/2020   Severe episode of recurrent major depressive disorder, without psychotic features (HCC) 06/18/2020   Irritable bowel syndrome with diarrhea 06/17/2020   Late effects of poliomyelitis 06/17/2020   Chronic obstructive  pulmonary disease, unspecified (HCC) 06/17/2020   Chronic insomnia 03/24/2020   Proteinuria 03/18/2020   Essential hypertension 03/03/2020   Gastroesophageal reflux disease without esophagitis 03/03/2020   Hyperlipidemia LDL goal <130 03/03/2020   Smokes 2 packs of cigarettes per day 05/02/2017   Pharyngoesophageal dysphagia 05/02/2017   Laryngopharyngeal reflux (LPR) 05/02/2017   Hoarseness 05/02/2017   Chronic sciatica of left side 04/01/2015   HNP (herniated nucleus pulposus), lumbar 02/21/2014   Synovial cyst of lumbar spine 02/21/2014   Lumbar foraminal stenosis 02/21/2014   Past Medical History:  Diagnosis Date   Anxiety    Atherosclerosis  Bronchitis due to tobacco use    quit smoking 3 months ago   Cancer [facility])    Chronic kidney disease    stage 3a, patient has kidney cyst   Chronic sciatica of left side 04/01/2015   COPD (chronic obstructive pulmonary disease) (HCC)    Depression    Dyspnea    occasional   GERD (gastroesophageal reflux disease)    no current problems and no meds   Hypertension    Lung cancer (HCC)    Mitral valve prolapse    years ago   Pneumonia    years ago   Polio    age 83 years old   Pre-diabetes    no meds    Family History  Problem Relation Age of Onset   Cancer Mother        thyroid    Heart attack Father    Cancer Sister        breast   Kidney disease Sister        ESRD   Irritable bowel syndrome Sister    Cancer Sister        thyroid    Colon cancer Neg Hx    Esophageal cancer Neg Hx     Past Surgical History:  Procedure Laterality Date   ABDOMINAL HYSTERECTOMY  1985   BACK SURGERY     BRONCHIAL BIOPSY  06/28/2022   Procedure: BRONCHIAL BIOPSIES;  Surgeon: Brenna Adine CROME, DO;  Location: [facility];  Service: Pulmonary;;   BRONCHIAL BRUSHINGS  06/28/2022   Procedure: BRONCHIAL BRUSHINGS;  Surgeon: Brenna Adine CROME, DO;  Location: [facility];  Service: Pulmonary;;   COLONOSCOPY  2017   EYE SURGERY     cataracts  removed bilateral   left leg surgery Left    LUMBAR LAMINECTOMY Left 02/21/2014   Procedure: Left L5-S1 Hemilaminectomy, Lateral Recess Decompression, Removal Synovial Cyst;  Surgeon: Oneil JAYSON Herald, MD;  Location: [facility] OR;  Service: Orthopedics;  Laterality: Left;   MICROLARYNGOSCOPY N/A 10/11/2018   Procedure: MICROLARYNGOSCOPY with Biopsy;  Surgeon: Jesus Oliphant, MD;  Location[facility] OR;  Service: ENT;  Laterality: N/A;   polio Bilateral 1950's   surgery on feet and legs for polio   TONSILLECTOMY     Social History   Occupational History   Occupation: Retired    Associate Professor: OTHER  Tobacco Use   Smoking status: Every Day    Current packs/day: 1.50    Average packs/day: 1.5 packs/day for 30.0 years (45.0 ttl pk-yrs)    Types: Cigarettes   Smokeless tobacco: Never  Vaping Use   Vaping status: Never Used  Substance and Sexual Activity   Alcohol use: Not Currently    Comment: occasional   Drug use: No   Sexual activity: Not Currently    Birth control/protection: Surgical    Comment: Hysterectomy

## 2023-07-01 ENCOUNTER — Other Ambulatory Visit: Payer: Self-pay | Admitting: Emergency Medicine

## 2023-07-06 ENCOUNTER — Other Ambulatory Visit: Payer: Self-pay | Admitting: Emergency Medicine

## 2023-07-06 DIAGNOSIS — F5104 Psychophysiologic insomnia: Secondary | ICD-10-CM

## 2023-07-07 ENCOUNTER — Telehealth: Payer: Self-pay | Admitting: Internal Medicine

## 2023-07-07 NOTE — Telephone Encounter (Signed)
[facility] @  Clinical Research Coordinator note:   This visit for Subject [name] with DOB: Sep 28, 1945 on 07/07/2023. Subject contacted regarding [id number] to inform that Principal Investigator has changed to Dr. Delton Coombes. Voicemail left requesting a return call.

## 2023-07-13 ENCOUNTER — Other Ambulatory Visit: Payer: Self-pay | Admitting: Emergency Medicine

## 2023-07-13 DIAGNOSIS — F5104 Psychophysiologic insomnia: Secondary | ICD-10-CM

## 2023-07-18 ENCOUNTER — Encounter: Payer: Self-pay | Admitting: Orthopaedic Surgery

## 2023-07-18 ENCOUNTER — Other Ambulatory Visit (INDEPENDENT_AMBULATORY_CARE_PROVIDER_SITE_OTHER): Payer: Self-pay

## 2023-07-18 ENCOUNTER — Ambulatory Visit: Payer: Medicare HMO | Admitting: Orthopaedic Surgery

## 2023-07-18 VITALS — BP 121/75 | HR 76

## 2023-07-18 DIAGNOSIS — M542 Cervicalgia: Secondary | ICD-10-CM | POA: Diagnosis not present

## 2023-07-18 DIAGNOSIS — M79602 Pain in left arm: Secondary | ICD-10-CM | POA: Diagnosis not present

## 2023-07-18 DIAGNOSIS — M25512 Pain in left shoulder: Secondary | ICD-10-CM | POA: Diagnosis not present

## 2023-07-18 MED ORDER — PREDNISONE 5 MG PO TABS: ORAL_TABLET | ORAL | 0 refills | Status: DC

## 2023-07-18 NOTE — Progress Notes (Unsigned)
 Office Visit Note   Patient: [name]           Date of Birth: 06-26-1945           MRN: [id number] Visit Date: 07/18/2023              Requested by: Georgina Quint, MD [address] [place],  [place] [address] PCP: Georgina Quint, MD   Assessment & Plan: Visit Diagnoses:  1. Neck pain   2. Left arm pain   3. Acute pain of left shoulder     Plan: Patient's pain radiating to shoulder blade is likely related to her cervical spondylosis.  If she develops increasing pain numbness down to her hand and would like proceed with cervical MRI she will let us know.  Follow-Up Instructions: Return if symptoms worsen or fail to improve.   Orders:  Orders Placed This Encounter  Procedures   XR Cervical Spine 2 or 3 views   XR Shoulder Left   Meds ordered this encounter  Medications   predniSONE (DELTASONE) 5 MG tablet    Sig: Take 4 tablets daily times 5 days then stop    Dispense:  20 tablet    Refill:  0      Procedures: No procedures performed   Clinical Data: No additional findings.   Subjective: Chief Complaint  Patient presents with   Left Shoulder - Pain   Left Arm - Pain    HPI 78 year old female long-term patient of ours fell at the beginning of the year with left shoulder and arm pain.  Pain radiates from her shoulder down to the mid humerus level laterally.  No numbness or tingling in her hand pain tends to wake her up at night.  She states she still is able to get her arm up overhead she ambulates with a cane and has to switch hands.  She took a hydrocodone last Saturday due to increased discomfort but states is actually some better today.  Past history of left upper lung cancer, depression, hypertension COPD.  Cubital tunnel on the left.  Review of Systems no fever chills no myelopathic symptoms.   Objective: Vital Signs: BP 121/75   Pulse 76   Physical Exam Constitutional:      Appearance: She is well-developed.  HENT:      Head: Normocephalic.     Right Ear: External ear normal.     Left Ear: External ear normal. There is no impacted cerumen.  Eyes:     Pupils: Pupils are equal, round, and reactive to light.  Neck:     Thyroid: No thyromegaly.     Trachea: No tracheal deviation.  Cardiovascular:     Rate and Rhythm: Normal rate.  Pulmonary:     Effort: Pulmonary effort is normal.  Abdominal:     Palpations: Abdomen is soft.  Musculoskeletal:     Cervical back: No rigidity.  Skin:    General: Skin is warm and dry.  Neurological:     Mental Status: She is alert and oriented to person, place, and time.  Psychiatric:        Behavior: Behavior normal.     Ortho Exam some discomfort with impingement shoulder.  Long head of the biceps is stable.  Specialty Comments:  MRI LUMBAR SPINE WITHOUT CONTRAST     TECHNIQUE:  Multiplanar, multisequence MR imaging of the lumbar spine was  performed. No intravenous contrast was administered.     COMPARISON:  Lumbar MRI  05/08/2018     FINDINGS:  Segmentation:  Normal     Alignment:  Mild retrolisthesis L1-2, L2-3, L3-4.     Vertebrae:  Normal bone marrow.  Negative for fracture or mass.     Conus medullaris and cauda equina: Conus extends to the L2 level.  Conus and cauda equina appear normal.     Paraspinal and other soft tissues: Negative for paraspinous mass or  adenopathy     Disc levels:     T11-12: Diffuse disc bulging with mild spinal stenosis     T12-L1: Negative     L1-2: Moderate disc degeneration with disc bulging and endplate  spurring. Mild facet degeneration. Small left-sided disc protrusion  with mild left subarticular stenosis. No interval change.     L2-3: Moderate to advanced disc degeneration with disc space  narrowing, disc bulging and diffuse endplate spurring. Moderate  facet hypertrophy. Mild spinal stenosis and mild subarticular  stenosis bilaterally unchanged.     L3-4: Diffuse disc bulging and moderate facet  hypertrophy. Moderate  spinal stenosis and moderate subarticular stenosis bilaterally. No  interval change.     L4-5: Disc degeneration with diffuse disc bulging. Small left  paracentral disc protrusion is unchanged. Bilateral facet  hypertrophy. Moderate subarticular stenosis bilaterally appears  unchanged.     L5-S1: Left laminotomy is chronic and unchanged. Disc degeneration  with diffuse endplate spurring. Moderate subarticular and foraminal  stenosis on the right. Moderate to severe subarticular and foraminal  stenosis on the left due to spurring is unchanged.     IMPRESSION:  Advanced multilevel disc and facet degeneration throughout the  lumbar spine causing spinal and foraminal stenosis as described  above.     Prior laminectomy left L5-S1. Moderate to severe subarticular and  foraminal stenosis on the left at L5-S1 due to spurring is  unchanged. Left paracentral disc protrusion L4-5 unchanged.     No significant change from the prior MRI 2019.        Electronically Signed    By: Marlan Palau M.D.    On: 09/18/2019 11:05  Imaging: AP lateral cervical spine images are obtained and reviewed.  There is mid cervical spondylosis with narrowing of C4-5 C5-6 and to a lesser degree C6-7.  Impression: Mid cervical spondylosis most pronounced at C4-5 C5-6. Three-view x-rays left shoulder obtained and reviewed.  Glenohumeral joint is normal.  No soft tissue calcification.  Mild degenerative changes acromioclavicular joint.  Impression: Normal glenohumeral joint.  Mild AC degenerative changes.  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) 06/09/2022   Other fatigue 12/21/2021   DOE (dyspnea on exertion) 12/21/2021   Positive urine drug screen 06/26/2020    Prediabetes 06/19/2020   CKD (chronic kidney disease) stage 3, GFR 30-59 ml/min (HCC) 06/19/2020   Severe episode of recurrent major depressive disorder, without psychotic features (HCC) 06/18/2020   Irritable bowel syndrome with diarrhea 06/17/2020   Late effects of poliomyelitis 06/17/2020   Chronic obstructive pulmonary disease, unspecified (HCC) 06/17/2020   Chronic insomnia 03/24/2020   Proteinuria 03/18/2020   Essential hypertension 03/03/2020   Gastroesophageal reflux disease without esophagitis 03/03/2020   Hyperlipidemia LDL goal <130 03/03/2020   Smokes 2 packs of cigarettes per day 05/02/2017   Pharyngoesophageal dysphagia 05/02/2017   Laryngopharyngeal reflux (  LPR) 05/02/2017   Hoarseness 05/02/2017   Chronic sciatica of left side 04/01/2015   HNP (herniated nucleus pulposus), lumbar 02/21/2014   Synovial cyst of lumbar spine 02/21/2014   Lumbar foraminal stenosis 02/21/2014   Past Medical History:  Diagnosis Date   Anxiety    Atherosclerosis    Bronchitis due to tobacco use    quit smoking 3 months ago   Cancer [facility])    Chronic kidney disease    stage 3a, patient has kidney cyst   Chronic sciatica of left side 04/01/2015   COPD (chronic obstructive pulmonary disease) (HCC)    Depression    Dyspnea    occasional   GERD (gastroesophageal reflux disease)    no current problems and no meds   Hypertension    Lung cancer (HCC)    Mitral valve prolapse    years ago   Pneumonia    years ago   Polio    age 57 years old   Pre-diabetes    no meds    Family History  Problem Relation Age of Onset   Cancer Mother        thyroid   Heart attack Father    Cancer Sister        breast   Kidney disease Sister        ESRD   Irritable bowel syndrome Sister    Cancer Sister        thyroid   Colon cancer Neg Hx    Esophageal cancer Neg Hx     Past Surgical History:  Procedure Laterality Date   ABDOMINAL HYSTERECTOMY  1985   BACK SURGERY     BRONCHIAL BIOPSY   06/28/2022   Procedure: BRONCHIAL BIOPSIES;  Surgeon: Josephine Igo, DO;  Location: [facility];  Service: Pulmonary;;   BRONCHIAL BRUSHINGS  06/28/2022   Procedure: BRONCHIAL BRUSHINGS;  Surgeon: Josephine Igo, DO;  Location: [facility];  Service: Pulmonary;;   COLONOSCOPY  2017   EYE SURGERY     cataracts removed bilateral   left leg surgery Left    LUMBAR LAMINECTOMY Left 02/21/2014   Procedure: Left L5-S1 Hemilaminectomy, Lateral Recess Decompression, Removal Synovial Cyst;  Surgeon: Eldred Manges, MD;  Location: [facility] OR;  Service: Orthopedics;  Laterality: Left;   MICROLARYNGOSCOPY N/A 10/11/2018   Procedure: MICROLARYNGOSCOPY with Biopsy;  Surgeon: Serena Colonel, MD;  Location: [facility] OR;  Service: ENT;  Laterality: N/A;   polio Bilateral 1950's   surgery on feet and legs for polio   TONSILLECTOMY     Social History   Occupational History   Occupation: Retired    Associate Professor: OTHER  Tobacco Use   Smoking status: Every Day    Current packs/day: 1.50    Average packs/day: 1.5 packs/day for 30.0 years (45.0 ttl pk-yrs)    Types: Cigarettes   Smokeless tobacco: Never  Vaping Use   Vaping status: Never Used  Substance and Sexual Activity   Alcohol use: Not Currently    Comment: occasional   Drug use: No   Sexual activity: Not Currently    Birth control/protection: Surgical    Comment: Hysterectomy

## 2023-07-20 ENCOUNTER — Telehealth: Payer: Self-pay

## 2023-07-20 NOTE — Telephone Encounter (Addendum)
 Received call from Mrs. Meschke she completed radiation treatment on 02/21/2023 SBRT for left upper lobe of lung.  Post treatment call on 04/11/2023.  She had some complaints of burning sensation/chest discomfort start on left side breast over to armpit area.with onset 1 month ago.  She reports discomfort wasn't felt internally but outside.  She reports she has appointment scheduled for mammogram on 07/27/2023.  RN advised she go to ED for evaluation to r/o heart or lung issues and if feeling weak, dizziness, lightheadedness, chest pain, vomiting, drop in BP.  She denies any of the symptoms above but reports she does smoke and how difficult it is to stop.  RN advised she reach out to PCP and or have Pulmonary doctor visit.  She has appointment on Monday 07/24/2023 with her PCP.  She thanks nurse for returning her call and that she doesn't feel need to go to ED at this time but if start having any symptoms will go.

## 2023-07-22 ENCOUNTER — Emergency Department (HOSPITAL_COMMUNITY)
Admission: EM | Admit: 2023-07-22 | Discharge: 2023-07-22 | Disposition: A | Discharge status: Home / Self Care | Attending: Emergency Medicine | Admitting: Emergency Medicine

## 2023-07-22 ENCOUNTER — Encounter (HOSPITAL_COMMUNITY): Payer: Self-pay

## 2023-07-22 ENCOUNTER — Other Ambulatory Visit: Payer: Self-pay

## 2023-07-22 ENCOUNTER — Emergency Department (HOSPITAL_COMMUNITY)

## 2023-07-22 DIAGNOSIS — Z79899 Other long term (current) drug therapy: Secondary | ICD-10-CM | POA: Insufficient documentation

## 2023-07-22 DIAGNOSIS — Z7982 Long term (current) use of aspirin: Secondary | ICD-10-CM | POA: Insufficient documentation

## 2023-07-22 DIAGNOSIS — Z85118 Personal history of other malignant neoplasm of bronchus and lung: Secondary | ICD-10-CM | POA: Insufficient documentation

## 2023-07-22 DIAGNOSIS — N1831 Chronic kidney disease, stage 3a: Secondary | ICD-10-CM | POA: Insufficient documentation

## 2023-07-22 DIAGNOSIS — I129 Hypertensive chronic kidney disease with stage 1 through stage 4 chronic kidney disease, or unspecified chronic kidney disease: Secondary | ICD-10-CM | POA: Diagnosis not present

## 2023-07-22 DIAGNOSIS — R0789 Other chest pain: Secondary | ICD-10-CM | POA: Diagnosis not present

## 2023-07-22 DIAGNOSIS — R918 Other nonspecific abnormal finding of lung field: Secondary | ICD-10-CM | POA: Diagnosis not present

## 2023-07-22 DIAGNOSIS — J449 Chronic obstructive pulmonary disease, unspecified: Secondary | ICD-10-CM | POA: Insufficient documentation

## 2023-07-22 DIAGNOSIS — R079 Chest pain, unspecified: Secondary | ICD-10-CM | POA: Diagnosis not present

## 2023-07-22 LAB — CBC WITH DIFFERENTIAL/PLATELET
Abs Immature Granulocytes: 0.01 10*3/uL (ref 0.00–0.07)
Basophils Absolute: 0 10*3/uL (ref 0.0–0.1)
Basophils Relative: 0 %
Eosinophils Absolute: 0.3 10*3/uL (ref 0.0–0.5)
Eosinophils Relative: 5 %
HCT: 37.5 % (ref 36.0–46.0)
Hemoglobin: 12.5 g/dL (ref 12.0–15.0)
Immature Granulocytes: 0 %
Lymphocytes Relative: 18 %
Lymphs Abs: 1 10*3/uL (ref 0.7–4.0)
MCH: 30.3 pg (ref 26.0–34.0)
MCHC: 33.3 g/dL (ref 30.0–36.0)
MCV: 90.8 fL (ref 80.0–100.0)
Monocytes Absolute: 0.6 10*3/uL (ref 0.1–1.0)
Monocytes Relative: 12 %
Neutro Abs: 3.3 10*3/uL (ref 1.7–7.7)
Neutrophils Relative %: 65 %
Platelets: 307 10*3/uL (ref 150–400)
RBC: 4.13 MIL/uL (ref 3.87–5.11)
RDW: 15 % (ref 11.5–15.5)
WBC: 5.2 10*3/uL (ref 4.0–10.5)
nRBC: 0 % (ref 0.0–0.2)

## 2023-07-22 LAB — COMPREHENSIVE METABOLIC PANEL
ALT: 15 U/L (ref 0–44)
AST: 21 U/L (ref 15–41)
Albumin: 3.2 g/dL — ABNORMAL LOW (ref 3.5–5.0)
Alkaline Phosphatase: 74 U/L (ref 38–126)
Anion gap: 12 (ref 5–15)
BUN: 14 mg/dL (ref 8–23)
CO2: 26 mmol/L (ref 22–32)
Calcium: 10 mg/dL (ref 8.9–10.3)
Chloride: 102 mmol/L (ref 98–111)
Creatinine, Ser: 1.01 mg/dL — ABNORMAL HIGH (ref 0.44–1.00)
GFR, Estimated: 57 mL/min — ABNORMAL LOW (ref >60)
Glucose, Bld: 100 mg/dL — ABNORMAL HIGH (ref 70–99)
Potassium: 4.5 mmol/L (ref 3.5–5.1)
Sodium: 140 mmol/L (ref 135–145)
Total Bilirubin: 0.3 mg/dL (ref 0.0–1.2)
Total Protein: 6.2 g/dL — ABNORMAL LOW (ref 6.5–8.1)

## 2023-07-22 LAB — TROPONIN I (HIGH SENSITIVITY)
Troponin I (High Sensitivity): 7 ng/L (ref <18)
Troponin I (High Sensitivity): 6 ng/L (ref <18)

## 2023-07-22 MED ORDER — KETOROLAC TROMETHAMINE 15 MG/ML IJ SOLN
15.0000 mg | Freq: Once | INTRAMUSCULAR | Status: AC
  Administered 2023-07-22: 15 mg via INTRAVENOUS
  Filled 2023-07-22: qty 1

## 2023-07-22 NOTE — ED Notes (Signed)
 Pt did well ambulating back from RR to room

## 2023-07-22 NOTE — ED Triage Notes (Signed)
 Pt c/o burning sensation on left breast that started a month ago. Pt states this morning she started having a "pinching" feeling in middle of chest that was constant. Pt denies nausea, has had shortness of breath.

## 2023-07-22 NOTE — ED Provider Notes (Signed)
 [place] EMERGENCY DEPARTMENT AT [facility] Provider Note   CSN: [id number] Arrival date & time: 07/22/23  1204     History  Chief Complaint  Patient presents with   Chest Pain    [name] is a 78 y.o. female who presents with chest pain over her left breast has been ongoing for the past few weeks.  Patient denies any injury or trauma.  Denies any exertional component to her pain.  She does not feel short of breath or dizzy.  Pain comes and goes.  No cardiac history or history of blood clots.   Chest Pain  Past Medical History:  Diagnosis Date   Anxiety    Atherosclerosis    Bronchitis due to tobacco use    quit smoking 3 months ago   Cancer [facility])    Chronic kidney disease    stage 3a, patient has kidney cyst   Chronic sciatica of left side 04/01/2015   COPD (chronic obstructive pulmonary disease) (HCC)    Depression    Dyspnea    occasional   GERD (gastroesophageal reflux disease)    no current problems and no meds   Hypertension    Lung cancer (HCC)    Mitral valve prolapse    years ago   Pneumonia    years ago   Polio    age 3 years old   Pre-diabetes    no meds       Home Medications Prior to Admission medications   Medication Sig Start Date End Date Taking? Authorizing Provider  aspirin 325 MG tablet Take 325 mg by mouth as needed for mild pain or headache.    [provider]  atorvastatin (LIPITOR) 40 MG tablet Take 40 mg by mouth daily. Patient not taking: Reported on 01/26/2023 04/08/16   [provider]  BIOTIN PO Take 2 capsules by mouth in the morning.    [provider]  diazepam (VALIUM) 5 MG tablet Take 5-15 mg by mouth daily as needed for anxiety.    [provider]  DULoxetine (CYMBALTA) 60 MG capsule TAKE 1 CAPSULE EVERY DAY 07/01/23   Georgina Quint, MD  Homeopathic Products (LEG CRAMP RELIEF) SUBL Place 1 tablet under the tongue daily as needed (leg cramps). [employer]    [provider]  hydrochlorothiazide (HYDRODIURIL) 25 MG tablet Take 1 tablet (25 mg total) by mouth daily. 05/10/23   Georgina Quint, MD  Magnesium Bisglycinate (MAG GLYCINATE) 100 MG TABS Take 300 mg by mouth at bedtime.    [provider]  oxyCODONE-acetaminophen (PERCOCET) 5-325 MG tablet Take 1 tablet by mouth every 6 (six) hours as needed. Patient not taking: Reported on 05/10/2023 02/22/23   Bethann Berkshire, MD  predniSONE (DELTASONE) 10 MG tablet Take 4 tablets daily times 6 days with food 06/27/23   Eldred Manges, MD  predniSONE (DELTASONE) 5 MG tablet Take 4 tablets daily times 5 days then stop 07/18/23   Eldred Manges, MD  zolpidem (AMBIEN) 5 MG tablet TAKE 1 TABLET BY MOUTH AT BEDTIME AS NEEDED FOR SLEEP 07/07/23   Georgina Quint, MD      Allergies    Amlodipine, Rosuvastatin, Sulfa antibiotics, Wellbutrin [bupropion], and Lisinopril    Review of Systems   Review of Systems  Cardiovascular:  Positive for chest pain.    Physical Exam Updated Vital Signs BP (!) 144/72   Pulse 63   Temp (!) 97.4 F (36.3 C) (Oral)  Resp 17   Ht 5\' 2"  (1.575 m)   Wt 73.5 kg   SpO2 96%   BMI 29.63 kg/m  Physical Exam Vitals and nursing note reviewed.  Constitutional:      General: She is not in acute distress.    Appearance: She is well-developed.  HENT:     Head: Normocephalic and atraumatic.  Eyes:     Conjunctiva/sclera: Conjunctivae normal.  Cardiovascular:     Rate and Rhythm: Normal rate and regular rhythm.     Heart sounds: No murmur heard. Pulmonary:     Effort: Pulmonary effort is normal. No respiratory distress.     Breath sounds: Normal breath sounds. No wheezing or rales.     Comments: Reproducible localized left sided chest tenderness Abdominal:     Palpations: Abdomen is soft.     Tenderness: There is no abdominal tenderness. There is no guarding or rebound.  Musculoskeletal:        General: No swelling or tenderness.     Cervical back: Neck  supple.  Skin:    General: Skin is warm and dry.     Capillary Refill: Capillary refill takes less than 2 seconds.  Neurological:     Mental Status: She is alert.  Psychiatric:        Mood and Affect: Mood normal.     ED Results / Procedures / Treatments   Labs (all labs ordered are listed, but only abnormal results are displayed) Labs Reviewed  COMPREHENSIVE METABOLIC PANEL - Abnormal; Notable for the following components:      Result Value   Glucose, Bld 100 (*)    Creatinine, Ser 1.01 (*)    Total Protein 6.2 (*)    Albumin 3.2 (*)    GFR, Estimated 57 (*)    All other components within normal limits  CBC WITH DIFFERENTIAL/PLATELET  TROPONIN I (HIGH SENSITIVITY)  TROPONIN I (HIGH SENSITIVITY)    EKG EKG Interpretation Date/Time:  Saturday July 22 2023 12:14:23 EST Ventricular Rate:  66 PR Interval:  128 QRS Duration:  76 QT Interval:  404 QTC Calculation: 423 R Axis:   7  Text Interpretation: Normal sinus rhythm Normal ECG When compared with ECG of 22-Feb-2023 05:57, PREVIOUS ECG IS PRESENT Confirmed by Gwyneth Sprout ([id number]) on 07/22/2023 2:01:29 PM  Radiology DG Chest 2 View Result Date: 07/22/2023 CLINICAL DATA:  Chest pain. Radiologic records indicates history of left upper lobe lung cancer. EXAM: CHEST - 2 VIEW COMPARISON:  Radiograph 06/28/2022.  CT 05/05/2023 FINDINGS: Ill-defined left upper lobe opacity corresponds to nodular area on CT. No other focal airspace disease. The heart is normal in size. No pneumothorax or pleural effusion. On limited assessment, no acute osseous findings. IMPRESSION: Left upper lobe opacity corresponds to nodule on prior CT, patient with known malignancy. Electronically Signed   By: Narda Rutherford M.D.   On: 07/22/2023 13:08    Procedures Procedures    Medications Ordered in ED Medications  ketorolac (TORADOL) 15 MG/ML injection 15 mg (has no administration in time range)    ED Course/ Medical Decision Making/  A&P Clinical Course as of 07/22/23 1618  Sat Jul 22, 2023  1607 Stable 46 YOF with a chief complaint of chest pain.  Atypical. Pain currently resolved. Troponins flat. Known pulm nodules.  [CC]  1615 Evaluated personally at bedside. Her pain is completely resolved on my serial reassessment. It is very atypical.  Worse with palpation of her left anterior chest.  Appears more musculoskeletal versus complication  of her known malignancy.  She is already scheduled for follow-up appointment on Monday with her primary care doctor.  She is also scheduled for follow-up with her oncologist for repeat CT scan given the finding on her last CT scan next week.  She was verbally informed of the findings of pulmonary nodule on her x-ray today.  She denies any chest pain shortness of breath at this time.  She is been ambulatory to baseline.  Oxygen saturations under percent heart rate 68.  Inconsistent currently with acute pulmonary embolism.  Discussed this with the patient.  Shared medical decision making and offered CTA PE study immediately and patient declined.  She feels comfortable in the outpatient setting. [CC]    Clinical Course User Index [CC] Glyn Ade, MD                                 Medical Decision Making Amount and/or Complexity of Data Reviewed Radiology: ordered.   This patient presents to the ED with chief complaint(s) of chest pain.  The complaint involves an extensive differential diagnosis and also carries with it a high risk of complications and morbidity.   pertinent past medical history as listed in HPI  The differential diagnosis includes  ACS, PE, aortic dissection, pneumonia, pneumothorax, myocarditis, pericarditis, musculoskeletal, GERD, esophageal dissection The initial plan is to  Will start with basic labs, EKG, chest x-ray Additional history obtained: Records reviewed previous admission documents and Care Everywhere/External Records  Initial Assessment:    Hemodynamically stable, nontoxic-appearing patient presenting with weeks of chest pain.  There is no exertional component.  No associated shortness of breath or dizziness.  No cardiac history or history of blood clots.  No significant risk factors for PE.  Her lung sounds are clear.  She does have localized reproducible left-sided chest tenderness.  There is no overlying rashes, no lesions, erythema or warmth.  Do not suspect infectious process.  No breast nodules appreciated.  Overall most concerning for musculoskeletal etiology  Independent ECG interpretation:  Sinus rhythm without ischemic changes  Independent labs interpretation:  The following labs were independently interpreted:  CBC unremarkable CMP without significant abnormality, troponins without elevation,  Independent visualization and interpretation of imaging: I independently visualized the following imaging with scope of interpretation limited to determining acute life threatening conditions related to emergency care: Chest x-ray, which revealed left upper lobe nodule corresponding to prior CT findings  Treatment and Reassessment: Patient given Toradol for suspected musculoskeletal pain.  Consultations obtained:   None  Disposition:   Patient will be discharged home.  Encouraged to follow-up with primary care provider. The patient has been appropriately medically screened and/or stabilized in the ED. I have low suspicion for any other emergent medical condition which would require further screening, evaluation or treatment in the ED or require inpatient management. At time of discharge the patient is hemodynamically stable and in no acute distress. I have discussed work-up results and diagnosis with patient and answered all questions. Patient is agreeable with discharge plan. We discussed strict return precautions for returning to the emergency department and they verbalized understanding.     Social Determinants of Health:    none  This note was dictated with voice recognition software.  Despite best efforts at proofreading, errors may have occurred which can change the documentation meaning.          Final Clinical Impression(s) / ED Diagnoses Final diagnoses:  Atypical  chest pain    Rx / DC Orders ED Discharge Orders     None         Fabienne Bruns 07/22/23 1618    Glyn Ade, MD 07/22/23 2337

## 2023-07-22 NOTE — ED Provider Triage Note (Signed)
 Emergency Medicine Provider Triage Evaluation Note  [name] , a 78 y.o. female  was evaluated in triage.  Pt complains of burning sensation on chest wallx1 month, 2 weeks ago started having pinching in chest. Yesterday pain was more frequent so decided to go to ED. Felt heart racing at 10AM. No recent surgeries. SOB on exertion.  Review of Systems  Positive: Chest pain Negative: Ble edema  Physical Exam  BP 129/75 (BP Location: Right Arm)   Pulse 70   Temp 98.2 F (36.8 C) (Oral)   Resp 16   Ht 5\' 2"  (1.575 m)   Wt 73.5 kg   SpO2 100%   BMI 29.63 kg/m  Gen:   Awake, no distress   Resp:  Normal effort  MSK:   Moves extremities without difficulty  Other:    Medical Decision Making  Medically screening exam initiated at 12:27 PM.  Appropriate orders placed.  [name] was informed that the remainder of the evaluation will be completed by another provider, this initial triage assessment does not replace that evaluation, and the importance of remaining in the ED until their evaluation is complete.     Pete Pelt, [place] 07/22/23 1229

## 2023-07-22 NOTE — ED Notes (Addendum)
 Pt did well ambulating to the RR

## 2023-07-22 NOTE — Discharge Instructions (Signed)
 You were evaluated in the emergency room for chest pain.  Your lab work imaging and EKG did not show any acute findings.  Please follow-up with your primary care doctor in within the next few days.  If you experience any new or worsening symptoms including shortness of breath, dizziness, worsening chest pain please return to the emergency room.

## 2023-07-22 NOTE — ED Notes (Signed)
 Spoke to chemistry in lab and they had not ran the troponin yet - advised they will run it now

## 2023-07-23 ENCOUNTER — Ambulatory Visit

## 2023-07-24 ENCOUNTER — Encounter: Payer: Self-pay | Admitting: Emergency Medicine

## 2023-07-24 ENCOUNTER — Ambulatory Visit (INDEPENDENT_AMBULATORY_CARE_PROVIDER_SITE_OTHER): Payer: Medicare HMO | Admitting: Emergency Medicine

## 2023-07-24 ENCOUNTER — Telehealth: Payer: Self-pay

## 2023-07-24 VITALS — BP 130/72 | HR 83 | Temp 97.5°F | Ht 62.0 in | Wt 171.0 lb

## 2023-07-24 DIAGNOSIS — I10 Essential (primary) hypertension: Secondary | ICD-10-CM | POA: Diagnosis not present

## 2023-07-24 DIAGNOSIS — J439 Emphysema, unspecified: Secondary | ICD-10-CM

## 2023-07-24 DIAGNOSIS — N644 Mastodynia: Secondary | ICD-10-CM | POA: Insufficient documentation

## 2023-07-24 DIAGNOSIS — R7303 Prediabetes: Secondary | ICD-10-CM

## 2023-07-24 DIAGNOSIS — N1831 Chronic kidney disease, stage 3a: Secondary | ICD-10-CM | POA: Diagnosis not present

## 2023-07-24 DIAGNOSIS — C3412 Malignant neoplasm of upper lobe, left bronchus or lung: Secondary | ICD-10-CM

## 2023-07-24 DIAGNOSIS — G894 Chronic pain syndrome: Secondary | ICD-10-CM

## 2023-07-24 DIAGNOSIS — F17200 Nicotine dependence, unspecified, uncomplicated: Secondary | ICD-10-CM

## 2023-07-24 DIAGNOSIS — K219 Gastro-esophageal reflux disease without esophagitis: Secondary | ICD-10-CM | POA: Diagnosis not present

## 2023-07-24 DIAGNOSIS — E785 Hyperlipidemia, unspecified: Secondary | ICD-10-CM | POA: Diagnosis not present

## 2023-07-24 MED ORDER — ROSUVASTATIN CALCIUM 20 MG PO TABS: 20.0000 mg | ORAL_TABLET | Freq: Every day | ORAL | 3 refills | Status: AC

## 2023-07-24 MED ORDER — EMPAGLIFLOZIN 10 MG PO TABS: 10.0000 mg | ORAL_TABLET | Freq: Every day | ORAL | 3 refills | Status: AC

## 2023-07-24 NOTE — Assessment & Plan Note (Signed)
1 to 2 packs/day for many years. Not motivated to quit Cardiovascular and cancer risk associated with smoking discussed Smoking cessation advice given.

## 2023-07-24 NOTE — Assessment & Plan Note (Signed)
 Chronic stable condition Advised to stay well-hydrated and avoid NSAIDs Recommend to start Jardiance 10 mg daily to avoid further kidney function deterioration

## 2023-07-24 NOTE — Progress Notes (Signed)
 [name] 78 y.o.   Chief Complaint  Patient presents with   Pain    Patient was at the ED Saturday for chest pain. She states it was Atypical chest pain. She states her left breast is having a burning sensation, tender, and a pinching feeling. and has pain above her left breast area.    HISTORY OF PRESENT ILLNESS: This is a 78 y.o. female here for follow-up of emergency department visit on 07/22/2023 when she presented with atypical left-sided chest pain. More of left breast pain.  Negative workup. Scheduled for left breast mammogram next Thursday.  Needs referral. No other associated symptoms No other complaints or medical concerns today.  HPI   Prior to Admission medications   Medication Sig Start Date End Date Taking? Authorizing Provider  aspirin 325 MG tablet Take 325 mg by mouth as needed for mild pain or headache.    [provider]  atorvastatin (LIPITOR) 40 MG tablet Take 40 mg by mouth daily. Patient not taking: Reported on 01/26/2023 04/08/16   [provider]  BIOTIN PO Take 2 capsules by mouth in the morning.    [provider]  diazepam (VALIUM) 5 MG tablet Take 5-15 mg by mouth daily as needed for anxiety.    [provider]  DULoxetine (CYMBALTA) 60 MG capsule TAKE 1 CAPSULE EVERY DAY 07/01/23   Georgina Quint, MD  Homeopathic Products (LEG CRAMP RELIEF) SUBL Place 1 tablet under the tongue daily as needed (leg cramps). Hyland Brand    [provider]  hydrochlorothiazide (HYDRODIURIL) 25 MG tablet Take 1 tablet (25 mg total) by mouth daily. 05/10/23   Georgina Quint, MD  Magnesium Bisglycinate (MAG GLYCINATE) 100 MG TABS Take 300 mg by mouth at bedtime.    [provider]  oxyCODONE-acetaminophen (PERCOCET) 5-325 MG tablet Take 1 tablet by mouth every 6 (six) hours as needed. Patient not taking: Reported on 05/10/2023 02/22/23   Bethann Berkshire, MD  predniSONE (DELTASONE) 10 MG tablet Take 4 tablets  daily times 6 days with food 06/27/23   Eldred Manges, MD  predniSONE (DELTASONE) 5 MG tablet Take 4 tablets daily times 5 days then stop 07/18/23   Eldred Manges, MD  zolpidem (AMBIEN) 5 MG tablet TAKE 1 TABLET BY MOUTH AT BEDTIME AS NEEDED FOR SLEEP 07/07/23   Georgina Quint, MD    Allergies  Allergen Reactions   Amlodipine Other (See Comments)    Edema   Rosuvastatin Rash   Sulfa Antibiotics Other (See Comments)    Unknown    Wellbutrin [Bupropion] Other (See Comments)    Hallucinations   Lisinopril Cough    Patient Active Problem List   Diagnosis Date Noted   Lumbosacral pain, chronic 09/13/2022   Cubital tunnel syndrome on left 08/29/2022   Chronic pain syndrome 08/09/2022   Current smoker 08/09/2022   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) 06/09/2022   Other fatigue 12/21/2021   DOE (dyspnea on exertion) 12/21/2021   Positive urine drug screen 06/26/2020   Prediabetes 06/19/2020   CKD (chronic kidney disease) stage 3, GFR 30-59 ml/min (HCC) 06/19/2020   Severe episode of recurrent major depressive disorder, without psychotic features (HCC) 06/18/2020   Irritable bowel syndrome with diarrhea 06/17/2020   Late effects of poliomyelitis 06/17/2020   Chronic obstructive pulmonary disease, unspecified (HCC) 06/17/2020   Chronic insomnia 03/24/2020   Proteinuria 03/18/2020   Essential hypertension 03/03/2020   Gastroesophageal reflux disease without esophagitis 03/03/2020  Hyperlipidemia LDL goal <130 03/03/2020   Smokes 2 packs of cigarettes per day 05/02/2017   Pharyngoesophageal dysphagia 05/02/2017   Laryngopharyngeal reflux (LPR) 05/02/2017   Hoarseness 05/02/2017   Chronic sciatica of left side 04/01/2015   HNP (herniated nucleus pulposus), lumbar 02/21/2014   Synovial cyst of lumbar spine 02/21/2014   Lumbar foraminal stenosis 02/21/2014    Past Medical History:  Diagnosis Date   Anxiety    Atherosclerosis    Bronchitis due to tobacco use     quit smoking 3 months ago   Cancer [facility])    Chronic kidney disease    stage 3a, patient has kidney cyst   Chronic sciatica of left side 04/01/2015   COPD (chronic obstructive pulmonary disease) (HCC)    Depression    Dyspnea    occasional   GERD (gastroesophageal reflux disease)    no current problems and no meds   Hypertension    Lung cancer (HCC)    Mitral valve prolapse    years ago   Pneumonia    years ago   Polio    age 74 years old   Pre-diabetes    no meds    Past Surgical History:  Procedure Laterality Date   ABDOMINAL HYSTERECTOMY  1985   BACK SURGERY     BRONCHIAL BIOPSY  06/28/2022   Procedure: BRONCHIAL BIOPSIES;  Surgeon: Josephine Igo, DO;  Location: [facility];  Service: Pulmonary;;   BRONCHIAL BRUSHINGS  06/28/2022   Procedure: BRONCHIAL BRUSHINGS;  Surgeon: Josephine Igo, DO;  Location: [facility];  Service: Pulmonary;;   COLONOSCOPY  2017   EYE SURGERY     cataracts removed bilateral   left leg surgery Left    LUMBAR LAMINECTOMY Left 02/21/2014   Procedure: Left L5-S1 Hemilaminectomy, Lateral Recess Decompression, Removal Synovial Cyst;  Surgeon: Eldred Manges, MD;  Location: [facility] OR;  Service: Orthopedics;  Laterality: Left;   MICROLARYNGOSCOPY N/A 10/11/2018   Procedure: MICROLARYNGOSCOPY with Biopsy;  Surgeon: Serena Colonel, MD;  Location: [facility] OR;  Service: ENT;  Laterality: N/A;   polio Bilateral 1950's   surgery on feet and legs for polio   TONSILLECTOMY      Social History   Socioeconomic History   Marital status: Married    Spouse name: [name]   Number of children: 1   Years of education: [employer] education level: Some college, no degree  Occupational History   Occupation: Retired    Associate Professor: OTHER  Tobacco Use   Smoking status: Every Day    Current packs/day: 1.50    Average packs/day: 1.5 packs/day for 30.0 years (45.0 ttl pk-yrs)    Types: Cigarettes   Smokeless tobacco: Never  Vaping Use   Vaping status: Never  Used  Substance and Sexual Activity   Alcohol use: Not Currently    Comment: occasional   Drug use: No   Sexual activity: Not Currently    Birth control/protection: Surgical    Comment: Hysterectomy  Other Topics Concern   Not on file  Social History Narrative   Patient lives at home with her spouse.   Caffeine Use: daily   Social Drivers of Health   Financial Resource Strain: Low Risk  (05/05/2023)   Overall Financial Resource Strain (CARDIA)    Difficulty of Paying Living Expenses: Not very hard  Food Insecurity: No Food Insecurity (05/05/2023)   Hunger Vital Sign    Worried About Running Out of Food in the Last Year: Never true  Ran Out of Food in the Last Year: Never true  Transportation Needs: No Transportation Needs (05/05/2023)   PRAPARE - Administrator, Civil Service (Medical): No    Lack of Transportation (Non-Medical): No  Physical Activity: Inactive (05/05/2023)   Exercise Vital Sign    Days of Exercise per Week: 0 days    Minutes of Exercise per Session: 10 min  Stress: Stress Concern Present (05/05/2023)   [employer] of Occupational Health - Occupational Stress Questionnaire    Feeling of Stress : Rather much  Social Connections: Unknown (05/05/2023)   Social Connection and Isolation Panel [NHANES]    Frequency of Communication with Friends and Family: Once a week    Frequency of Social Gatherings with Friends and Family: Patient declined    Attends Religious Services: More than 4 times per year    Active Member of [employer] or Organizations: Yes    Attends Banker Meetings: 1 to 4 times per year    Marital Status: Married  Catering manager Violence: Not At Risk (01/26/2023)   Humiliation, Afraid, Rape, and Kick questionnaire    Fear of Current or Ex-Partner: No    Emotionally Abused: No    Physically Abused: No    Sexually Abused: No    Family History  Problem Relation Age of Onset   Cancer Mother        thyroid   Heart  attack Father    Cancer Sister        breast   Kidney disease Sister        ESRD   Irritable bowel syndrome Sister    Cancer Sister        thyroid   Colon cancer Neg Hx    Esophageal cancer Neg Hx      Review of Systems  Constitutional: Negative.  Negative for chills and fever.  HENT: Negative.  Negative for congestion and sore throat.   Respiratory: Negative.  Negative for cough and shortness of breath.   Cardiovascular: Negative.  Negative for chest pain and palpitations.  Gastrointestinal:  Negative for abdominal pain, diarrhea, nausea and vomiting.  Genitourinary: Negative.  Negative for dysuria.  Skin: Negative.  Negative for rash.  Neurological: Negative.  Negative for dizziness and headaches.  All other systems reviewed and are negative.   Today's Vitals   07/24/23 1335  BP: 130/72  Pulse: 83  Temp: (!) 97.5 F (36.4 C)  TempSrc: Oral  SpO2: 95%  Weight: 171 lb (77.6 kg)  Height: 5\' 2"  (1.575 m)   Body mass index is 31.28 kg/m.   Physical Exam Vitals reviewed.  Constitutional:      Appearance: Normal appearance.  HENT:     Head: Normocephalic.     Mouth/Throat:     Mouth: Mucous membranes are moist.     Pharynx: Oropharynx is clear.  Eyes:     Extraocular Movements: Extraocular movements intact.     Pupils: Pupils are equal, round, and reactive to light.  Cardiovascular:     Rate and Rhythm: Normal rate and regular rhythm.     Pulses: Normal pulses.     Heart sounds: Normal heart sounds.  Pulmonary:     Effort: Pulmonary effort is normal.     Breath sounds: Normal breath sounds.  Chest:     Chest wall: Tenderness (Left upper chest) present.  Musculoskeletal:     Cervical back: No tenderness.  Lymphadenopathy:     Cervical: No cervical adenopathy.  Skin:  General: Skin is warm and dry.     Capillary Refill: Capillary refill takes less than 2 seconds.  Neurological:     General: No focal deficit present.     Mental Status: She is alert and  oriented to person, place, and time.  Psychiatric:        Mood and Affect: Mood normal.        Behavior: Behavior normal.     ASSESSMENT & PLAN: A total of 45 minutes was spent with the patient and counseling/coordination of care regarding preparing for this visit, review of most recent office visit notes, review of multiple chronic medical conditions and their management, review of all medications, review of most recent bloodwork results, review of health maintenance items, education on nutrition, prognosis, documentation, and need for follow up.   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  Stable. Not using any inhalers at present time          Digestive   Gastroesophageal reflux disease without esophagitis   Chronic stable condition. No concerns. Asymptomatic         Genitourinary   CKD (chronic kidney disease) stage 3, GFR 30-59 ml/min (HCC)   Chronic stable condition Advised to stay well-hydrated and avoid NSAIDs Recommend to start Jardiance 10 mg daily to avoid further kidney function deterioration      Relevant Medications   empagliflozin (JARDIANCE) 10 MG TABS tablet     Other   Prediabetes   With proteinuria and diminished GFR Recommend to start Jardiance 10 mg daily      Relevant Medications   empagliflozin (JARDIANCE) 10 MG TABS tablet   Dyslipidemia   Stable chronic condition Had some muscle cramping with atorvastatin Recommend to switch to  rosuvastatin 20 mg every other day      Relevant Medications   rosuvastatin (CRESTOR) 20 MG tablet   Chronic pain syndrome   Secondary to chronic back issues. Sees orthopedist Dr. Ophelia Charter on a regular basis Pain management handled by his office.      Current smoker   1 to 2 packs/day for many years. Not motivated to quit Cardiovascular and cancer risk associated with smoking discussed Smoking cessation advice given.      Breast pain - Primary   Left breast tenderness and also chest wall tenderness Schedule for diagnostic mammogram next Thursday Pain management discussed      Relevant Orders   MM 3D DIAGNOSTIC MAMMOGRAM BILATERAL BREAST   Patient Instructions  Chest Wall Pain Chest wall pain is pain in or around the bones and muscles of your chest. Chest wall pain may be caused by: An injury. Coughing a lot. Using your chest and arm muscles too much. Sometimes, the cause may not be known. This pain may take a few weeks or longer to get better. Follow these instructions at home: Managing pain, stiffness, and swelling If told, put ice on the painful area: Put ice in a plastic  bag. Place a towel between your skin and the bag. Leave the ice on for 20 minutes, 2-3 times a day.  Activity Rest as told by your doctor. Avoid doing things that cause pain. This includes lifting heavy items. Ask your doctor what activities are safe for you. General instructions  Take over-the-counter and prescription medicines only as told by your doctor. Do not use any products that contain nicotine or tobacco, such as cigarettes, e-cigarettes, and chewing tobacco. If you need help quitting, ask your doctor. Keep all follow-up visits as told by your doctor. This is important. Contact a doctor if: You have a fever. Your chest pain gets worse. You have new symptoms. Get help right away if: You feel sick to your stomach (nauseous) or you throw up (vomit). You feel sweaty or light-headed. You  have a cough with mucus from your lungs (sputum) or you cough up blood. You are short of breath. These symptoms may be an emergency. Do not wait to see if the symptoms will go away. Get medical help right away. Call your local emergency services (911 in the [place] Do not drive yourself to the hospital. Summary Chest wall pain is pain in or around the bones and muscles of your chest. It may be treated with ice, rest, and medicines. Your condition may also get better if you avoid doing things that cause pain. Contact a doctor if you have a fever, chest pain that gets worse, or new symptoms. Get help right away if you feel light-headed or you get short of breath. These symptoms may be an emergency. This information is not intended to replace advice given to you by your health care provider. Make sure you discuss any questions you have with your health care provider. Document Revised: 05/02/2022 Document Reviewed: 05/02/2022 Elsevier Patient Education  2024 Elsevier Inc.     Edwina Barth, MD [facility]

## 2023-07-24 NOTE — Assessment & Plan Note (Signed)
 Left breast tenderness and also chest wall tenderness Schedule for diagnostic mammogram next Thursday Pain management discussed

## 2023-07-24 NOTE — Telephone Encounter (Signed)
 Copied from CRM [id number]. Topic: Clinical - Medication Question >> Jul 24, 2023  2:38 PM Almira Coaster wrote: Reason for CRM: Patient is calling with some concerns to the rosuvastatin (CRESTOR) 20 MG tablet prescription that Dr.Sagardia prescribed today. She was reading the summary that was given and it states she is allergic to rosuvastatin, she is not sure if Dr.Sagardia should send an alternate medication.

## 2023-07-24 NOTE — Assessment & Plan Note (Signed)
 With proteinuria and diminished GFR Recommend to start Jardiance 10 mg daily

## 2023-07-24 NOTE — Assessment & Plan Note (Signed)
 Stable chronic condition Had some muscle cramping with atorvastatin Recommend to switch to rosuvastatin 20 mg every other day

## 2023-07-24 NOTE — Assessment & Plan Note (Signed)
 Well-controlled hypertension Continue hydrochlorothiazide 25 mg BP Readings from Last 3 Encounters:  07/24/23 130/72  07/22/23 139/64  07/18/23 121/75

## 2023-07-24 NOTE — Assessment & Plan Note (Signed)
Continues to smoke.  Stable. Not using any inhalers at present time

## 2023-07-24 NOTE — Patient Instructions (Signed)
Chest Wall Pain Chest wall pain is pain in or around the bones and muscles of your chest. Chest wall pain may be caused by: An injury. Coughing a lot. Using your chest and arm muscles too much. Sometimes, the cause may not be known. This pain may take a few [place] or longer to get better. Follow these instructions at home: Managing pain, stiffness, and swelling If told, put ice on the painful area: Put ice in a plastic bag. Place a towel between your skin and the bag. Leave the ice on for 20 minutes, 2-3 times a day.  Activity Rest as told by your doctor. Avoid doing things that cause pain. This includes lifting heavy items. Ask your doctor what activities are safe for you. General instructions  Take over-the-counter and prescription medicines only as told by your doctor. Do not use any products that contain nicotine or tobacco, such as cigarettes, e-cigarettes, and chewing tobacco. If you need help quitting, ask your doctor. Keep all follow-up visits as told by your doctor. This is important. Contact a doctor if: You have a fever. Your chest pain gets worse. You have new symptoms. Get help right away if: You feel sick to your stomach (nauseous) or you throw up (vomit). You feel sweaty or light-headed. You have a cough with mucus from your lungs (sputum) or you cough up blood. You are short of breath. These symptoms may be an emergency. Do not wait to see if the symptoms will go away. Get medical help right away. Call your local emergency services (911 in the [place] Do not drive yourself to the hospital. Summary Chest wall pain is pain in or around the bones and muscles of your chest. It may be treated with ice, rest, and medicines. Your condition may also get better if you avoid doing things that cause pain. Contact a doctor if you have a fever, chest pain that gets worse, or new symptoms. Get help right away if you feel light-headed or you get short of breath. These symptoms may  be an emergency. This information is not intended to replace advice given to you by your health care provider. Make sure you discuss any questions you have with your health care provider. Document Revised: 05/02/2022 Document Reviewed: 05/02/2022 Elsevier Patient Education  2024 [employer].

## 2023-07-24 NOTE — Assessment & Plan Note (Signed)
Secondary to chronic back issues. Sees orthopedist Dr. Lorin Mercy on a regular basis Pain management handled by his office.

## 2023-07-24 NOTE — Assessment & Plan Note (Signed)
Follows up with pulmonary on a regular basis Under surveillance

## 2023-07-24 NOTE — Assessment & Plan Note (Signed)
 Chronic stable condition.  No concerns.  Asymptomatic.

## 2023-07-25 NOTE — Telephone Encounter (Signed)
 She was taking atorvastatin 40 mg without any problems.  It does not appear she is allergic to statins.  Thanks.

## 2023-07-26 ENCOUNTER — Encounter: Payer: Self-pay | Admitting: Radiology

## 2023-07-27 NOTE — Telephone Encounter (Signed)
 Spoke with patient and she will continue atorvastatin

## 2023-07-27 NOTE — Telephone Encounter (Signed)
 Ok

## 2023-08-03 ENCOUNTER — Encounter: Payer: Self-pay | Admitting: Emergency Medicine

## 2023-08-03 DIAGNOSIS — N644 Mastodynia: Secondary | ICD-10-CM | POA: Diagnosis not present

## 2023-08-03 LAB — HM MAMMOGRAPHY

## 2023-08-05 ENCOUNTER — Other Ambulatory Visit: Payer: Self-pay | Admitting: Emergency Medicine

## 2023-08-05 DIAGNOSIS — F5104 Psychophysiologic insomnia: Secondary | ICD-10-CM

## 2023-08-21 DIAGNOSIS — H5203 Hypermetropia, bilateral: Secondary | ICD-10-CM | POA: Diagnosis not present

## 2023-08-21 DIAGNOSIS — E7801 Familial hypercholesterolemia: Secondary | ICD-10-CM | POA: Diagnosis not present

## 2023-08-21 DIAGNOSIS — I10 Essential (primary) hypertension: Secondary | ICD-10-CM | POA: Diagnosis not present

## 2023-08-22 ENCOUNTER — Telehealth: Payer: Self-pay | Admitting: *Deleted

## 2023-08-22 NOTE — Telephone Encounter (Signed)
 Called patient to inform of Ct for 08-28-23- arrival time- 4:15 pm @ [facility], no restrictions to scan, patient to be called with results on 08-30-23 @ 11 am with results by Marcello Fennel spoke with patient and she is aware of these appts. and the instructions

## 2023-08-28 ENCOUNTER — Ambulatory Visit (HOSPITAL_COMMUNITY)
Admission: RE | Admit: 2023-08-28 | Discharge: 2023-08-28 | Disposition: A | Discharge status: Home / Self Care | Source: Ambulatory Visit | Attending: Urology | Admitting: Urology

## 2023-08-28 ENCOUNTER — Encounter (HOSPITAL_COMMUNITY): Payer: Self-pay

## 2023-08-28 DIAGNOSIS — C3412 Malignant neoplasm of upper lobe, left bronchus or lung: Secondary | ICD-10-CM

## 2023-08-28 MED ORDER — IOHEXOL 300 MG/ML  SOLN: 75.0000 mL | Freq: Once | INTRAMUSCULAR | Status: DC | PRN

## 2023-08-30 ENCOUNTER — Ambulatory Visit: Payer: Medicare HMO | Admitting: Urology

## 2023-09-04 ENCOUNTER — Other Ambulatory Visit: Payer: Self-pay

## 2023-09-04 DIAGNOSIS — C3412 Malignant neoplasm of upper lobe, left bronchus or lung: Secondary | ICD-10-CM

## 2023-09-07 ENCOUNTER — Other Ambulatory Visit: Payer: Self-pay | Admitting: Emergency Medicine

## 2023-09-07 DIAGNOSIS — F5104 Psychophysiologic insomnia: Secondary | ICD-10-CM

## 2023-09-13 ENCOUNTER — Encounter: Payer: Self-pay | Admitting: Internal Medicine

## 2023-09-13 ENCOUNTER — Ambulatory Visit (INDEPENDENT_AMBULATORY_CARE_PROVIDER_SITE_OTHER): Admitting: Internal Medicine

## 2023-09-13 ENCOUNTER — Ambulatory Visit (HOSPITAL_COMMUNITY)
Admission: RE | Admit: 2023-09-13 | Discharge: 2023-09-13 | Disposition: A | Discharge status: Home / Self Care | Source: Ambulatory Visit | Attending: Urology | Admitting: Urology

## 2023-09-13 VITALS — BP 112/68 | HR 82 | Temp 97.9°F | Ht 62.0 in | Wt 172.0 lb

## 2023-09-13 DIAGNOSIS — I10 Essential (primary) hypertension: Secondary | ICD-10-CM | POA: Diagnosis not present

## 2023-09-13 DIAGNOSIS — C3412 Malignant neoplasm of upper lobe, left bronchus or lung: Secondary | ICD-10-CM | POA: Diagnosis not present

## 2023-09-13 DIAGNOSIS — I700 Atherosclerosis of aorta: Secondary | ICD-10-CM | POA: Diagnosis not present

## 2023-09-13 DIAGNOSIS — J90 Pleural effusion, not elsewhere classified: Secondary | ICD-10-CM | POA: Diagnosis not present

## 2023-09-13 DIAGNOSIS — J449 Chronic obstructive pulmonary disease, unspecified: Secondary | ICD-10-CM

## 2023-09-13 DIAGNOSIS — J432 Centrilobular emphysema: Secondary | ICD-10-CM | POA: Diagnosis not present

## 2023-09-13 MED ORDER — IOHEXOL 300 MG/ML  SOLN
75.0000 mL | Freq: Once | INTRAMUSCULAR | Status: AC | PRN
  Administered 2023-09-13: 75 mL via INTRAVENOUS

## 2023-09-13 MED ORDER — AMOXICILLIN-POT CLAVULANATE 875-125 MG PO TABS: 1.0000 | ORAL_TABLET | Freq: Two times a day (BID) | ORAL | 0 refills | Status: AC

## 2023-09-13 MED ORDER — SODIUM CHLORIDE (PF) 0.9 % IJ SOLN
INTRAMUSCULAR | Status: AC
  Filled 2023-09-13: qty 50

## 2023-09-13 MED ORDER — BENZONATATE 100 MG PO CAPS: 100.0000 mg | ORAL_CAPSULE | Freq: Three times a day (TID) | ORAL | 2 refills | Status: AC | PRN

## 2023-09-13 NOTE — Patient Instructions (Addendum)
      Medications changes include :   Augmentin  twice daily x 10 days, tessalon  perles for cough     Return if symptoms worsen or fail to improve.

## 2023-09-13 NOTE — Progress Notes (Signed)
 Subjective:    Patient ID: [name], female    DOB: 1945-06-01, 78 y.o.   MRN: [id number]      HPI [name] is here for  Chief Complaint  Patient presents with   Cough    Cough for over a week; Hx of COPD; Not sure if she has bronchitis or pneumonia    She is here for an acute visit for cold symptoms.   Her symptoms started one week ago.  She has COPD.  She is still smoking.  She is not on oxygen and does not use any inhalers.  She is experiencing Mild scratchy throat, cough and wheezing.  She does have mucus in her chest, but is not able to bring up any mucus.  She has tried taking tessalon  perles      Medications and allergies reviewed with patient and updated if appropriate.  Current Outpatient Medications on File Prior to Visit  Medication Sig Dispense Refill   aspirin  325 MG tablet Take 325 mg by mouth as needed for mild pain or headache.     BIOTIN PO Take 2 capsules by mouth in the morning.     diazepam  (VALIUM ) 5 MG tablet Take 5-15 mg by mouth daily as needed for anxiety.     DULoxetine  (CYMBALTA ) 60 MG capsule TAKE 1 CAPSULE EVERY DAY 90 capsule 3   empagliflozin  (JARDIANCE ) 10 MG TABS tablet Take 1 tablet (10 mg total) by mouth daily before breakfast. 90 tablet 3   Homeopathic Products (LEG CRAMP RELIEF) SUBL Place 1 tablet under the tongue daily as needed (leg cramps). Hyland Brand     hydrochlorothiazide  (HYDRODIURIL ) 25 MG tablet Take 1 tablet (25 mg total) by mouth daily. 90 tablet 3   Magnesium Bisglycinate (MAG GLYCINATE) 100 MG TABS Take 300 mg by mouth at bedtime.     oxyCODONE -acetaminophen  (PERCOCET) 5-325 MG tablet Take 1 tablet by mouth every 6 (six) hours as needed. 20 tablet 0   predniSONE  (DELTASONE ) 10 MG tablet Take 4 tablets daily times 6 days with food 24 tablet 0   predniSONE  (DELTASONE ) 5 MG tablet Take 4 tablets daily times 5 days then stop 20 tablet 0   rosuvastatin  (CRESTOR ) 20 MG tablet Take 1 tablet (20 mg total) by mouth daily. 90  tablet 3   zolpidem  (AMBIEN ) 5 MG tablet TAKE 1 TABLET BY MOUTH AT BEDTIME AS NEEDED FOR SLEEP 30 tablet 0   Current Facility-Administered Medications on File Prior to Visit  Medication Dose Route Frequency Provider Last Rate Last Admin   0.9 %  sodium chloride  infusion  500 mL Intravenous Once Cirigliano, Vito V, DO        Review of Systems  Constitutional:  Negative for fever.  HENT:  Positive for sore throat (mild). Negative for congestion, ear pain and sinus pain.   Respiratory:  Positive for cough (not able to get mucus up) and wheezing. Negative for chest tightness and shortness of breath.   Gastrointestinal:  Negative for diarrhea and nausea.  Musculoskeletal:  Negative for myalgias.  Neurological:  Negative for dizziness, light-headedness and headaches.       Objective:   Vitals:   09/13/23 1436  BP: 112/68  Pulse: 82  Temp: 97.9 F (36.6 C)  SpO2: 98%   BP Readings from Last 3 Encounters:  09/13/23 112/68  07/24/23 130/72  07/22/23 139/64   Wt Readings from Last 3 Encounters:  09/13/23 172 lb (78 kg)  07/24/23 171 lb (77.6 kg)  07/22/23 162 lb (73.5 kg)   Body mass index is 31.46 kg/m.    Physical Exam Constitutional:      General: She is not in acute distress.    Appearance: Normal appearance. She is not ill-appearing.  HENT:     Head: Normocephalic and atraumatic.     Right Ear: Tympanic membrane, ear canal and external ear normal.     Left Ear: Tympanic membrane, ear canal and external ear normal.     Mouth/Throat:     Mouth: Mucous membranes are moist.     Pharynx: No oropharyngeal exudate or posterior oropharyngeal erythema.  Eyes:     Conjunctiva/sclera: Conjunctivae normal.  Cardiovascular:     Rate and Rhythm: Normal rate and regular rhythm.  Pulmonary:     Effort: Pulmonary effort is normal. No respiratory distress.     Breath sounds: Normal breath sounds. No wheezing or rales.  Musculoskeletal:     Cervical back: Neck supple. No  tenderness.  Lymphadenopathy:     Cervical: No cervical adenopathy.  Skin:    General: Skin is warm and dry.  Neurological:     Mental Status: She is alert.            Assessment & Plan:    COPD exacerbation, URI: Acute COPD exacerbation secondary to URI-difficult to tell if this is viral or bacterial No wheezing on exam and air is entry is good so we will hold off on any steroids or inhalers Start Augmentin  875-125 mg twice daily x 10 days Tessalon  Perles 100 mg 3 times daily as needed Encouraged smoking cessation If her symptoms or not improving or getting any worse she will call  Hypertension: Chronic Blood pressure is well-controlled here today Continue hydrochlorothiazide  25 mg daily

## 2023-09-26 NOTE — Progress Notes (Signed)
 Telephone nursing appointment for review of most recent CT-Chest results. I verified patient's identity x2 and began nursing interview.    Patient states issues as follows... -Pain: Lt breast, & LT shoulder/back 7/10. -Fatigue: Moderate -Chest: Denies -Lungs: Denies -Cardiac: Denies -Skin: Denies -Appetite: Reduced    Patient denies any other related issues at this time.   Meaningful use complete.   Patient aware of their telephone appointment w/ Ashlyn Bruning PA-C. I left my extension [phone] in case patient needs anything. Patient verbalized understanding. This concludes the nursing interview.   Patient preferred phone # [phone]    Avery Bodo, LPN

## 2023-09-27 ENCOUNTER — Ambulatory Visit
Admission: RE | Admit: 2023-09-27 | Discharge: 2023-09-27 | Disposition: A | Discharge status: Home / Self Care | Source: Ambulatory Visit | Attending: Urology | Admitting: Urology

## 2023-09-27 ENCOUNTER — Encounter: Payer: Self-pay | Admitting: Urology

## 2023-09-27 DIAGNOSIS — C3412 Malignant neoplasm of upper lobe, left bronchus or lung: Secondary | ICD-10-CM

## 2023-09-27 DIAGNOSIS — F17210 Nicotine dependence, cigarettes, uncomplicated: Secondary | ICD-10-CM | POA: Diagnosis not present

## 2023-09-27 NOTE — Progress Notes (Signed)
 Radiation Oncology         [phone] ________________________________  Name: [name] MRN: [id number]  Date: 09/27/2023  DOB: [birth year]  Post Treatment Note  CC: Colleen Hammersmith, MD  [name] [place], [place]  Diagnosis:   78 y/o woman with putative Stage IA, NSCLC of the LUL lung   Interval Since Last Radiation:  8 months  02/13/23 - 02/21/23: SBRT// The target in the LUL lung was treated to 54 Gy in 3 fractions of 18 Gy each.   Narrative:  I spoke with the patient to conduct her routine scheduled follow up visit to review the results of her recent posttreatment CT chest scan via telephone to spare the patient unnecessary potential exposure in the healthcare setting.  The patient was notified in advance and gave permission to proceed with this visit format.  She tolerated the stereotactic body radiotherapy well with only mild fatigue.       Her CT chest scan from 05/05/2023 did show a good response to treatment of the treated nodule in the LUL lung which had decreased in size.  However, it did show a new, small, 4 mm rounded nodule in the LLL, too small to characterize and warranting close monitoring.  Therefore, we got a repeat CT chest scan on 09/13/2023 that shows posttreatment changes in the LLL but no substantial change in size of the additional scattered pulmonary nodules.  There was a new, 2 mm, subsolid nodule in the RLL, nonspecific and likely infectious/inflammatory.  We reviewed these results today by telephone.                     On review of systems, the patient states that she is doing well in general aside from some pain in the left shoulder and upper back as well as left breast, ongoing for a couple of months now. She was evaluated in the emergency department on 07/22/2023 for atypical left-sided chest pain and worked up with negative.  Her mammogram on 08/03/2023 was without any concerning findings but she was told that she had some clogged "ducts" in the breast.  She  has had a recent cough that started a few days after her recent CT Scan and she is completing a course of Amoxicillin  today. She denies any increased shortness of breath, chest pain or hemoptysis.  She has not had recent fevers, chills, nausea or vomiting.  Overall, she is pleased with her progress to date.  ALLERGIES:  is allergic to amlodipine, rosuvastatin , sulfa antibiotics, wellbutrin [bupropion], and lisinopril.  Meds: Current Outpatient Medications  Medication Sig Dispense Refill   amoxicillin -clavulanate (AUGMENTIN ) 875-125 MG tablet Take 1 tablet by mouth 2 (two) times daily. 20 tablet 0   aspirin  325 MG tablet Take 325 mg by mouth as needed for mild pain or headache.     benzonatate  (TESSALON ) 100 MG capsule Take 1 capsule (100 mg total) by mouth 3 (three) times daily as needed for cough. 30 capsule 2   BIOTIN PO Take 2 capsules by mouth in the morning.     diazepam  (VALIUM ) 5 MG tablet Take 5-15 mg by mouth daily as needed for anxiety.     DULoxetine  (CYMBALTA ) 60 MG capsule TAKE 1 CAPSULE EVERY DAY 90 capsule 3   empagliflozin  (JARDIANCE ) 10 MG TABS tablet Take 1 tablet (10 mg total) by mouth daily before breakfast. 90 tablet 3   Homeopathic Products (LEG CRAMP RELIEF) SUBL Place 1 tablet under the tongue daily as  needed (leg cramps). [employer]     hydrochlorothiazide  (HYDRODIURIL ) 25 MG tablet Take 1 tablet (25 mg total) by mouth daily. 90 tablet 3   Magnesium Bisglycinate (MAG GLYCINATE) 100 MG TABS Take 300 mg by mouth at bedtime.     oxyCODONE -acetaminophen  (PERCOCET) 5-325 MG tablet Take 1 tablet by mouth every 6 (six) hours as needed. 20 tablet 0   predniSONE  (DELTASONE ) 10 MG tablet Take 4 tablets daily times 6 days with food 24 tablet 0   rosuvastatin  (CRESTOR ) 20 MG tablet Take 1 tablet (20 mg total) by mouth daily. 90 tablet 3   zolpidem  (AMBIEN ) 5 MG tablet TAKE 1 TABLET BY MOUTH AT BEDTIME AS NEEDED FOR SLEEP 30 tablet 0   Current Facility-Administered Medications   Medication Dose Route Frequency Provider Last Rate Last Admin   0.9 %  sodium chloride  infusion  500 mL Intravenous Once Cirigliano, Vito V, DO        Physical Findings:  vitals were not taken for this visit.  Pain Assessment Pain Score: 7 /10 Unable to assess due to telephone follow-up visit format.  Lab Findings: Lab Results  Component Value Date   WBC 5.2 07/22/2023   HGB 12.5 07/22/2023   HCT 37.5 07/22/2023   MCV 90.8 07/22/2023   PLT 307 07/22/2023     Radiographic Findings: CT Chest W Contrast Result Date: 09/26/2023 CLINICAL DATA:  Left upper lobe non-small cell lung cancer status post SBRT 02/10/2023. Restaging. * Tracking Code: BO * EXAM: CT CHEST WITH CONTRAST TECHNIQUE: Multidetector CT imaging of the chest was performed during intravenous contrast administration. RADIATION DOSE REDUCTION: This exam was performed according to the departmental dose-optimization program which includes automated exposure control, adjustment of the mA and/or kV according to patient size and/or use of iterative reconstruction technique. CONTRAST:  75mL OMNIPAQUE  IOHEXOL  300 MG/ML  SOLN COMPARISON:  CT chest dated 05/05/2023 and priors FINDINGS: Cardiovascular: Normal heart size. No significant pericardial fluid/thickening. Great vessels are normal in caliber. After and right subclavian artery. No central pulmonary emboli. Aortic atherosclerosis. Mediastinum/Nodes: Imaged thyroid  gland without nodules meeting criteria for imaging follow-up by size. Normal esophagus. No pathologically enlarged axillary, supraclavicular, mediastinal, or hilar lymph nodes. Lungs/Pleura: The central airways are patent. Increased peripheral irregular consolidation along the lateral left upper lobe and anterolateral perifissural left lower lobe at the site of treated disease. No substantial change in size of additional scattered pulmonary nodules, for example irregular 1.5 x 1.0 cm left apical (5:19) and a posterior  subpleural right upper lobe 1.2 x 0.6 cm (5:29). Similar upper lobe predominant centrilobular and paraseptal emphysema and scattered peripheral reticulations. Previously noted new 4 mm nodule within the left lower lobe has been present on prior examinations and on retrospect is unchanged (5:65). On today's examination, there is a 2 mm subsolid nodule in the right lower lobe (5:102). Unchanged right middle lobe calcified nodule. No pneumothorax. New small left pleural effusion. Upper abdomen: Partially imaged colonic diverticulosis without acute diverticulitis. Musculoskeletal: No acute or abnormal lytic or blastic osseous lesions. Multilevel degenerative changes of the thoracic spine. IMPRESSION: 1. Increased peripheral irregular consolidation along the lateral left upper lobe and anterolateral perifissural left lower lobe at the site of treated disease, likely post radiation change. New small left pleural effusion. 2. No substantial change in size of additional scattered pulmonary nodules. 3. New 2 mm subsolid nodule in the right lower lobe, nonspecific and may be infectious/inflammatory. 4. Aortic Atherosclerosis (ICD10-I70.0) and Emphysema (ICD10-J43.9). Electronically Signed   By:  Limin  Xu M.D.   On: 09/26/2023 09:26    Impression/Plan: 1. 78 y/o woman with putative Stage IA, NSCLC of the LUL lung   She appears to have recovered well from the effects of her SBRT and is currently without complaints.  Her recent posttreatment CT chest scan from 09/13/2023 shows posttreatment changes in the LLL but no substantial change in size of the additional scattered pulmonary nodules.  There was a new, 2 mm, subsolid nodule in the RLL, nonspecific and likely infectious/inflammatory.  We reviewed these results today and the recommendation to proceed with continued surveillance with serial CT chest scans every 6 months until we reach 5 years disease-free.  I will plan to contact her by telephone following each scan to  review the results and recommendations.  She appears to have a good understanding of her disease and is comfortable and in agreement with the stated plan.  She knows that she is welcome to call at anytime in the interim with any questions or concerns.  I personally spent 30 minutes in this encounter including chart review, reviewing radiological studies, telephone conversation with the patient, entering orders and completing documentation.     Arta Bihari, PA-C

## 2023-10-09 ENCOUNTER — Other Ambulatory Visit: Payer: Self-pay | Admitting: Emergency Medicine

## 2023-10-09 DIAGNOSIS — F5104 Psychophysiologic insomnia: Secondary | ICD-10-CM

## 2023-10-10 DIAGNOSIS — K5900 Constipation, unspecified: Secondary | ICD-10-CM | POA: Diagnosis not present

## 2023-10-25 ENCOUNTER — Other Ambulatory Visit: Payer: Self-pay | Admitting: Radiology

## 2023-10-25 ENCOUNTER — Telehealth: Payer: Self-pay | Admitting: Emergency Medicine

## 2023-10-25 DIAGNOSIS — E785 Hyperlipidemia, unspecified: Secondary | ICD-10-CM

## 2023-10-25 MED ORDER — ATORVASTATIN CALCIUM 40 MG PO TABS: 40.0000 mg | ORAL_TABLET | Freq: Every day | ORAL | 3 refills | Status: AC

## 2023-10-25 NOTE — Telephone Encounter (Signed)
 Copied from CRM [id number]. Topic: Clinical - Medication Question >> Oct 25, 2023  3:01 PM [place] wrote: Reason for CRM: Patient calling to get a refill on atorvastatin (LIPITOR) 20 MG tablet [[id number]] . She said she is almost out of the medication but its listetd as not taking it since 01/2023

## 2023-10-25 NOTE — Telephone Encounter (Signed)
 Okay to refill?

## 2023-11-07 ENCOUNTER — Other Ambulatory Visit: Payer: Self-pay | Admitting: Emergency Medicine

## 2023-11-07 DIAGNOSIS — F5104 Psychophysiologic insomnia: Secondary | ICD-10-CM

## 2024-02-28 DIAGNOSIS — B91 Sequelae of poliomyelitis: Secondary | ICD-10-CM | POA: Diagnosis not present

## 2024-02-28 DIAGNOSIS — R7303 Prediabetes: Secondary | ICD-10-CM | POA: Diagnosis not present

## 2024-02-28 DIAGNOSIS — K219 Gastro-esophageal reflux disease without esophagitis: Secondary | ICD-10-CM | POA: Diagnosis not present

## 2024-02-28 DIAGNOSIS — J439 Emphysema, unspecified: Secondary | ICD-10-CM | POA: Diagnosis not present

## 2024-02-28 DIAGNOSIS — F17200 Nicotine dependence, unspecified, uncomplicated: Secondary | ICD-10-CM | POA: Diagnosis not present

## 2024-02-28 DIAGNOSIS — C3490 Malignant neoplasm of unspecified part of unspecified bronchus or lung: Secondary | ICD-10-CM | POA: Diagnosis not present

## 2024-02-28 DIAGNOSIS — F324 Major depressive disorder, single episode, in partial remission: Secondary | ICD-10-CM | POA: Diagnosis not present

## 2024-02-28 DIAGNOSIS — I10 Essential (primary) hypertension: Secondary | ICD-10-CM | POA: Diagnosis not present

## 2024-02-29 ENCOUNTER — Telehealth: Payer: Self-pay | Admitting: Emergency Medicine

## 2024-02-29 NOTE — Telephone Encounter (Signed)
 Contacted [name] to schedule their annual wellness visit. Patient declined to schedule AWV at this time. Transferred care per patient.   [facility] Care Guide [facility] AWV TEAM Direct Dial: [phone]

## 2024-03-18 ENCOUNTER — Ambulatory Visit: Admitting: Orthopaedic Surgery

## 2024-03-18 ENCOUNTER — Other Ambulatory Visit: Payer: Self-pay

## 2024-03-18 DIAGNOSIS — G8929 Other chronic pain: Secondary | ICD-10-CM

## 2024-03-18 DIAGNOSIS — M5442 Lumbago with sciatica, left side: Secondary | ICD-10-CM

## 2024-03-18 DIAGNOSIS — M5441 Lumbago with sciatica, right side: Secondary | ICD-10-CM

## 2024-03-18 MED ORDER — TIZANIDINE HCL 2 MG PO TABS: 2.0000 mg | ORAL_TABLET | Freq: Three times a day (TID) | ORAL | 1 refills | Status: DC | PRN

## 2024-03-18 NOTE — Progress Notes (Signed)
 The patient is a 78 year old female that I am seeing for the first time.  She is a former patient of Dr. Barbarann and has had at least 2 lumbar spine surgeries by Dr. Barbarann with the first surgery being in 2010 and the second in 2015.  The second surgery was a left sided L5-S1 hemilaminectomy.  There is no hardware in her back.  She does ambulate with a cane.  She has chronic low back pain with bilateral lower extremity radicular changes.  She did have a MRI of her lumbar spine in 2021 showing severe stenosis of the lower lumbar spine.  She says occasionally she will take Tylenol  3 and would really like to try that again.  She does have a TENS unit of her husbands at home but she is not sure what to calibrate this at.  Right now she is not interested in any type of injections because she said they have hurt too much in the past.  She is not on muscle relaxant.  Not a diabetic and is not on blood thinning medications.  I did review all of her medications within epic.  She does have limited mobility due to pain in her lumbar spine.  She has a positive straight leg raise bilaterally but no gross weakness in her legs.  2 views lumbar spine show significant degenerative changes at multiple levels especially at L5-S1.  I would like her to try tizanidine at 2 mg to help with muscle spasms and pain and try to hold off on any narcotics as long as we can.  I did offer physical therapy or even sending her to Dr. Eldonna for steroid injections in her lumbar spine but she would like to hold off on that.  Thus far follow-up can be as needed unless things worsen.

## 2024-03-21 ENCOUNTER — Telehealth: Payer: Self-pay | Admitting: *Deleted

## 2024-03-21 NOTE — Telephone Encounter (Signed)
 Called patient to inform of CT for 03-29-24- arrival time- 1:15 pm @ [facility], no restrictions to scan, patient to receive results from Ashlyn Bruning on 04-10-24 @ 10:30 am via telephone , spoke with patient and she is aware of these appts. and the instructions

## 2024-03-25 ENCOUNTER — Encounter: Payer: Self-pay | Admitting: Radiology

## 2024-03-29 ENCOUNTER — Ambulatory Visit (HOSPITAL_COMMUNITY)
Admission: RE | Admit: 2024-03-29 | Discharge: 2024-03-29 | Disposition: A | Discharge status: Home / Self Care | Source: Ambulatory Visit | Attending: Urology | Admitting: Urology

## 2024-03-29 DIAGNOSIS — J432 Centrilobular emphysema: Secondary | ICD-10-CM | POA: Diagnosis not present

## 2024-03-29 DIAGNOSIS — C3412 Malignant neoplasm of upper lobe, left bronchus or lung: Secondary | ICD-10-CM | POA: Diagnosis not present

## 2024-03-29 DIAGNOSIS — I700 Atherosclerosis of aorta: Secondary | ICD-10-CM | POA: Diagnosis not present

## 2024-03-29 DIAGNOSIS — C3490 Malignant neoplasm of unspecified part of unspecified bronchus or lung: Secondary | ICD-10-CM | POA: Diagnosis not present

## 2024-03-29 MED ORDER — IOHEXOL 300 MG/ML  SOLN
75.0000 mL | Freq: Once | INTRAMUSCULAR | Status: AC | PRN
  Administered 2024-03-29: 75 mL via INTRAVENOUS

## 2024-03-29 MED ORDER — SODIUM CHLORIDE (PF) 0.9 % IJ SOLN
INTRAMUSCULAR | Status: AC
  Filled 2024-03-29: qty 50

## 2024-04-02 ENCOUNTER — Ambulatory Visit: Payer: Self-pay | Admitting: Acute Care

## 2024-04-02 NOTE — Telephone Encounter (Signed)
 Called and got patient scheduled to see Hattar at 2.NFN

## 2024-04-02 NOTE — Telephone Encounter (Signed)
 Call disconnected during transfer from PAS  One attempt to contact patient for triage, LVM for patient to return call to [phone], placed in callbacks for follow up  Copied from CRM #[id number]. Topic: Clinical - Red Word Triage >> Apr 02, 2024  3:11 PM Nathanel DEL wrote: Red Word that prompted transfer to Nurse Triage: SOB, breathing issues, not experienced before

## 2024-04-02 NOTE — Telephone Encounter (Signed)
 E2C2 Pulmonary Triage - Initial Assessment Questions "Chief Complaint (e.g., cough, sob, wheezing, fever, chills, sweat or additional symptoms) *Go to specific symptom protocol after initial questions. Sob, wheezing intermittent at night denies fever, chills, chest pain, faint  "How long have symptoms been present?" Week ago  Have you tested for COVID or Flu? Note: If not, ask patient if a home test can be taken. If so, instruct patient to call back for positive results. No  MEDICINES:   "Have you used any OTC meds to help with symptoms?" No If yes, ask "What medications?"   "Have you used your inhalers/maintenance medication?" No If yes, "What medications?"   If inhaler, ask "How many puffs and how often?" Note: Review instructions on medication in the chart. NA  OXYGEN: "Do you wear supplemental oxygen?" No If yes, "How many liters are you supposed to use?" NA  "Do you monitor your oxygen levels?" No If yes, What is your reading (oxygen level) today? NA  What is your usual oxygen saturation reading?  (Note: Pulmonary O2 sats should be 90% or greater) Unsure;does not check O2     Reason for Disposition  [1] MILD difficulty breathing (e.g., minimal/no SOB at rest, SOB with walking, pulse < 100) AND [2] NEW-onset or WORSE than normal  Answer Assessment - Initial Assessment Questions No available appts today. Advised UC now and ED/911 if symptoms worsen.  Patient reports will go to UC.  1. RESPIRATORY STATUS: Describe your breathing? (e.g., wheezing, shortness of breath, unable to speak, severe coughing)      Mouth breathing, wheezing (intermittently when laying down) Denies dizziness, faint, chest pain, nasal congestion Does not have any medications for inhaler, neb tx  2. ONSET: When did this breathing problem begin?      Sunday 3. PATTERN Does the difficult breathing come and go, or has it been constant since it started?      Comes and goes with  exertion, 4. SEVERITY: How bad is your breathing? (e.g., mild, moderate, severe)      mild 5. RECURRENT SYMPTOM: Have you had difficulty breathing before? If Yes, ask: When was the last time? and What happened that time?      Yes;past Sunday 6. CARDIAC HISTORY: Do you have any history of heart disease? (e.g., heart attack, angina, bypass surgery, angioplasty)      no 7. LUNG HISTORY: Do you have any history of lung disease?  (e.g., pulmonary embolus, asthma, emphysema)     COPD, adenocarcinoma with radiation has shrink; just did CT Scan last FRiday 8. CAUSE: What do you think is causing the breathing problem?      Maybe copd 9. OTHER SYMPTOMS: Do you have any other symptoms? (e.g., chest pain, cough, dizziness, fever, runny nose)     denies 10. O2 SATURATION MONITOR:  Do you use an oxygen saturation monitor (pulse oximeter) at home? If Yes, ask: What is your reading (oxygen level) today? What is your usual oxygen saturation reading? (e.g., 95%)       Does not have  12. TRAVEL: Have you traveled out of the country in the last month? (e.g., travel history, exposures)       no  Protocols used: Breathing Difficulty-A-AH

## 2024-04-03 ENCOUNTER — Ambulatory Visit

## 2024-04-03 VITALS — BP 144/83 | HR 77 | Temp 98.5°F | Ht 60.0 in | Wt 168.0 lb

## 2024-04-03 DIAGNOSIS — R0602 Shortness of breath: Secondary | ICD-10-CM | POA: Diagnosis not present

## 2024-04-03 DIAGNOSIS — J449 Chronic obstructive pulmonary disease, unspecified: Secondary | ICD-10-CM

## 2024-04-03 DIAGNOSIS — R918 Other nonspecific abnormal finding of lung field: Secondary | ICD-10-CM | POA: Diagnosis not present

## 2024-04-03 DIAGNOSIS — F17210 Nicotine dependence, cigarettes, uncomplicated: Secondary | ICD-10-CM

## 2024-04-03 DIAGNOSIS — R911 Solitary pulmonary nodule: Secondary | ICD-10-CM

## 2024-04-03 DIAGNOSIS — Z720 Tobacco use: Secondary | ICD-10-CM

## 2024-04-03 MED ORDER — ALBUTEROL SULFATE HFA 108 (90 BASE) MCG/ACT IN AERS: 2.0000 | INHALATION_SPRAY | Freq: Four times a day (QID) | RESPIRATORY_TRACT | 6 refills | Status: AC | PRN

## 2024-04-03 NOTE — Progress Notes (Signed)
 Subjective:   PATIENT ID: [name] Lesches GENDER: female DOB: May 24, 1945, MRN: [id number]   HPI Discussed the use of AI scribe software for clinical note transcription with the patient, who gave verbal consent to proceed.  History of Present Illness [name] is a 78 year old female with lung nodules and COPD who presents with shortness of breath.  She experiences shortness of breath, which began on Sunday, March 31, 2024. The sensation is described as needing to walk around with her mouth open to breathe. No associated chest pain, cough, phlegm production, or leg swelling. Wheezing occurs at night when lying down but not when sitting up. She does not have a home oxygen monitor as her previous one broke.  She has a history of a nodule on her left upper lung, treated with radiation therapy after it continued to grow despite a negative biopsy. She has a history of pneumonia as a child, which was thought to have caused scarring on her lungs.  She has been diagnosed with COPD and is a current smoker, smoking close to two packs per day. She has attempted to quit smoking in the past, successfully abstaining for six months at one point. She has not used inhalers before, citing difficulty due to not having teeth and not knowing how to use them properly.  Her past medical history includes polio at age [age], and she is currently experiencing post-polio syndrome. No worsening of leg swelling related to this condition.     Past Medical History:  Diagnosis Date   Anxiety    Atherosclerosis    Bronchitis due to tobacco use    quit smoking 3 months ago   Cancer [facility])    Chronic kidney disease    stage 3a, patient has kidney cyst   Chronic sciatica of left side 04/01/2015   COPD (chronic obstructive pulmonary disease) (HCC)    Depression    Dyspnea    occasional   GERD (gastroesophageal reflux disease)    no current problems and no meds   Hypertension    Lung cancer (HCC)    Mitral  valve prolapse    years ago   Pneumonia    years ago   Polio    age 48 years old   Pre-diabetes    no meds     Family History  Problem Relation Age of Onset   Cancer Mother        thyroid    Heart attack Father    Cancer Sister        breast   Kidney disease Sister        ESRD   Irritable bowel syndrome Sister    Cancer Sister        thyroid    Colon cancer Neg Hx    Esophageal cancer Neg Hx      Social History   Socioeconomic History   Marital status: Married    Spouse name: Lamar   Number of children: 1   Years of education: [employer] education level: Some college, no degree  Occupational History   Occupation: Retired    Associate Professor: OTHER  Tobacco Use   Smoking status: Every Day    Current packs/day: 1.50    Average packs/day: 1.5 packs/day for 30.0 years (45.0 ttl pk-yrs)    Types: Cigarettes   Smokeless tobacco: Never  Vaping Use   Vaping status: Never Used  Substance and Sexual Activity   Alcohol use: Not Currently  Comment: occasional   Drug use: No   Sexual activity: Not Currently    Birth control/protection: Surgical    Comment: Hysterectomy  Other Topics Concern   Not on file  Social History Narrative   Patient lives at home with her spouse.   Caffeine Use: daily   Social Drivers of Health   Financial Resource Strain: Low Risk  (05/05/2023)   Overall Financial Resource Strain (CARDIA)    Difficulty of Paying Living Expenses: Not very hard  Food Insecurity: No Food Insecurity (09/27/2023)   Hunger Vital Sign    Worried About Running Out of Food in the Last Year: Never true    Ran Out of Food in the Last Year: Never true  Transportation Needs: No Transportation Needs (09/27/2023)   PRAPARE - Administrator, Civil Service (Medical): No    Lack of Transportation (Non-Medical): No  Physical Activity: Unknown (05/05/2023)   Exercise Vital Sign    Days of Exercise per Week: 0 days    Minutes of Exercise per Session: Not on file   Recent Concern: Physical Activity - Inactive (05/05/2023)   Exercise Vital Sign    Days of Exercise per Week: 0 days    Minutes of Exercise per Session: 10 min  Stress: Stress Concern Present (05/05/2023)   [employer] of Occupational Health - Occupational Stress Questionnaire    Feeling of Stress : Rather much  Social Connections: Unknown (05/05/2023)   Social Connection and Isolation Panel    Frequency of Communication with Friends and Family: Once a week    Frequency of Social Gatherings with Friends and Family: Patient declined    Attends Religious Services: More than 4 times per year    Active Member of [employer] or Organizations: Yes    Attends Banker Meetings: 1 to 4 times per year    Marital Status: Married  Catering Manager Violence: Not At Risk (09/27/2023)   Humiliation, Afraid, Rape, and Kick questionnaire    Fear of Current or Ex-Partner: No    Emotionally Abused: No    Physically Abused: No    Sexually Abused: No     Allergies  Allergen Reactions   Amlodipine Other (See Comments)    Edema   Rosuvastatin  Rash   Sulfa Antibiotics Other (See Comments)    Unknown    Wellbutrin [Bupropion] Other (See Comments)    Hallucinations   Lisinopril Cough     Outpatient Medications Prior to Visit  Medication Sig Dispense Refill   amoxicillin -clavulanate (AUGMENTIN ) 875-125 MG tablet Take 1 tablet by mouth 2 (two) times daily. 20 tablet 0   aspirin  325 MG tablet Take 325 mg by mouth as needed for mild pain or headache.     atorvastatin  (LIPITOR) 40 MG tablet Take 1 tablet (40 mg total) by mouth daily. 90 tablet 3   benzonatate  (TESSALON ) 100 MG capsule Take 1 capsule (100 mg total) by mouth 3 (three) times daily as needed for cough. 30 capsule 2   BIOTIN PO Take 2 capsules by mouth in the morning.     DULoxetine  (CYMBALTA ) 60 MG capsule TAKE 1 CAPSULE EVERY DAY 90 capsule 3   empagliflozin  (JARDIANCE ) 10 MG TABS tablet Take 1 tablet (10 mg total) by mouth  daily before breakfast. 90 tablet 3   Homeopathic Products (LEG CRAMP RELIEF) SUBL Place 1 tablet under the tongue daily as needed (leg cramps). Hyland [employer]     hydrochlorothiazide  (HYDRODIURIL ) 25 MG tablet Take 1 tablet (25 mg  total) by mouth daily. 90 tablet 3   Magnesium Bisglycinate (MAG GLYCINATE) 100 MG TABS Take 300 mg by mouth at bedtime.     tiZANidine (ZANAFLEX) 2 MG tablet Take 1 tablet (2 mg total) by mouth every 8 (eight) hours as needed for muscle spasms. 30 tablet 1   zolpidem  (AMBIEN ) 5 MG tablet TAKE 1 TABLET BY MOUTH AT BEDTIME AS NEEDED FOR SLEEP 30 tablet 0   diazepam  (VALIUM ) 5 MG tablet Take 5-15 mg by mouth daily as needed for anxiety. (Patient not taking: Reported on 04/03/2024)     oxyCODONE -acetaminophen  (PERCOCET) 5-325 MG tablet Take 1 tablet by mouth every 6 (six) hours as needed. (Patient not taking: Reported on 04/03/2024) 20 tablet 0   predniSONE  (DELTASONE ) 10 MG tablet Take 4 tablets daily times 6 days with food (Patient not taking: Reported on 04/03/2024) 24 tablet 0   rosuvastatin  (CRESTOR ) 20 MG tablet Take 1 tablet (20 mg total) by mouth daily. (Patient not taking: Reported on 04/03/2024) 90 tablet 3   Facility-Administered Medications Prior to Visit  Medication Dose Route Frequency Provider Last Rate Last Admin   0.9 %  sodium chloride  infusion  500 mL Intravenous Once Cirigliano, Vito V, DO        ROS Reviewed all systems and reported negative except as above     Objective:   Vitals:   04/03/24 1509  BP: (!) 144/83  Pulse: 77  Temp: 98.5 F (36.9 C)  TempSrc: Oral  SpO2: 96%  Weight: 168 lb (76.2 kg)  Height: 5' (1.524 m)    Physical Exam Physical Exam GENERAL: Appropriate to age, no acute distress. HEAD EYES EARS NOSE THROAT: Moist mucous membranes, atraumatic, normocephalic. CHEST: Clear to auscultation bilaterally, no wheezing, no crackles, no rales CARDIAC: Regular rate and rhythm, normal S1, normal S2, no murmurs, no rubs, no  gallops. ABDOMEN: Soft, nontender. NEUROLOGICAL: Motor and sensation grossly intact, alert and oriented times X 3. EXTREMITIES: Warm, well perfused, no edema.     CBC    Component Value Date/Time   WBC 5.2 07/22/2023 1217   RBC 4.13 07/22/2023 1217   HGB 12.5 07/22/2023 1217   HCT 37.5 07/22/2023 1217   PLT 307 07/22/2023 1217   MCV 90.8 07/22/2023 1217   MCH 30.3 07/22/2023 1217   MCHC 33.3 07/22/2023 1217   RDW 15.0 07/22/2023 1217   LYMPHSABS 1.0 07/22/2023 1217   MONOABS 0.6 07/22/2023 1217   EOSABS 0.3 07/22/2023 1217   BASOSABS 0.0 07/22/2023 1217     Chest imaging:  PFT:     No data to display          Labs:    Echo:       Assessment & Plan:   Assessment and Plan Assessment & Plan Shortness of breath Acute onset with wheezing. - Ordered chest x-ray to evaluate for new pulmonary changes. - Provided albuterol inhaler for symptomatic relief. - Trial of Stiolto free sample given - Educated on inhaler use with assistance from Montia.  Pulmonary nodules, left upper lobe post-radiation and right lung stable, under surveillance Left upper lobe nodule post-radiation, right lung nodule stable on CT. - Continue surveillance with periodic imaging. - She has a Appointment with radiation oncology, I discussed with her her right lung nodule and whether she would be interested in a biopsy in the future.  Patient states that she is not interested in a biopsy for her right lung nodule at this time.  She can discuss her right lung  nodule with radiation oncology but for now she seems more interested in surveillance imaging and no intervention.  Chronic obstructive pulmonary disease (COPD) COPD with recent exacerbation, no current inhaler use due to technique and dental issues. - Provided albuterol inhaler for symptomatic relief. - Educated on inhaler use with assistance from Montia.  Tobacco use disorder Long-standing tobacco use, smoking nearly two packs per  day, acknowledges impact on COPD and lung cancer. - Advised smoking cessation and discussed potential benefits. Smoking/Tobacco Cessation Counseling LAYLAMARIE MEUSER is a current user of tobacco or nicotine products. She is not ready to quit at this time. Counseling provided today addressed the risks of continued use and the benefits of cessation. Discussed tobacco/nicotine use history, readiness to quit, and evidence-based treatment options including behavioral strategies, support resources, and pharmacologic therapies. Provided encouragement and educational materials on steps and resources to quit smoking. Patient questions were addressed, and follow-up recommended for continued support. Total time spent on counseling: 3 minutes.         Zola Herter, MD [facility] Office: [phone]

## 2024-04-03 NOTE — Patient Instructions (Signed)
  VISIT SUMMARY: Today, you were seen for shortness of breath that started on March 31, 2024. You have a history of lung nodules , and you are a current smoker. We discussed your symptoms, reviewed your medical history, and provided a plan to help manage your condition.  YOUR PLAN: -SHORTNESS OF BREATH: Shortness of breath can be caused by various conditions, including lung issues. We ordered a chest x-ray to check for any new changes in your lungs. You were given an albuterol inhaler to help relieve your symptoms, and Montia assisted you with learning how to use it properly.  -PULMONARY NODULES: Pulmonary nodules are small growths in the lungs. Your left upper lobe nodule has been treated with radiation, and the right lung nodule is stable. We will continue to monitor these nodules with periodic imaging.  -CHRONIC OBSTRUCTIVE PULMONARY DISEASE (COPD): COPD is a chronic lung disease that makes it hard to breathe. You were given an albuterol inhaler to help with your symptoms.   -TOBACCO USE DISORDER: Tobacco use disorder is a condition where you are dependent on smoking. We discussed the importance of quitting smoking and how it can benefit your lung health and overall well-being.  INSTRUCTIONS: Please follow up with the chest x-ray as ordered. Continue using the albuterol inhaler as directed, and contact us  if you have any questions or issues with its use. We will continue to monitor your lung nodules with periodic imaging. Consider the benefits of quitting smoking, and let us  know if you need support or resources to help you quit.

## 2024-04-04 ENCOUNTER — Telehealth: Payer: Self-pay | Admitting: *Deleted

## 2024-04-04 NOTE — Telephone Encounter (Signed)
 RETURNED PATIENT'S PHONE CALL, SPOKE WITH PATIENT. ?

## 2024-04-08 NOTE — Progress Notes (Signed)
 Follow up call to discuss chest CT scan results from 04/01/24.  Pain/cough/SOB?  IMPRESSION: 1. No significant change in heterogeneous consolidation in a bandlike distribution in the peripheral left upper lobe, consistent with radiation pneumonitis/fibrosis. Although there has been little interval change this may continue to evolve given appearance. Attention on follow-up. 2. Unchanged nodular opacity in the posterior right upper lobe measuring 1.2 x 0.7 cm. 3. Unchanged subsolid nodule in the dependent left lower lobe measuring 0.4 cm. 4. Emphysema.

## 2024-04-10 ENCOUNTER — Ambulatory Visit: Payer: Self-pay | Admitting: Urology

## 2024-04-10 DIAGNOSIS — I700 Atherosclerosis of aorta: Secondary | ICD-10-CM | POA: Diagnosis not present

## 2024-04-10 DIAGNOSIS — Z9989 Dependence on other enabling machines and devices: Secondary | ICD-10-CM | POA: Diagnosis not present

## 2024-04-10 DIAGNOSIS — E785 Hyperlipidemia, unspecified: Secondary | ICD-10-CM | POA: Diagnosis not present

## 2024-04-10 DIAGNOSIS — F32A Depression, unspecified: Secondary | ICD-10-CM | POA: Diagnosis not present

## 2024-04-10 DIAGNOSIS — I3489 Other nonrheumatic mitral valve disorders: Secondary | ICD-10-CM | POA: Diagnosis not present

## 2024-04-10 DIAGNOSIS — M62838 Other muscle spasm: Secondary | ICD-10-CM | POA: Diagnosis not present

## 2024-04-10 DIAGNOSIS — N1831 Chronic kidney disease, stage 3a: Secondary | ICD-10-CM | POA: Diagnosis not present

## 2024-04-10 DIAGNOSIS — Z6834 Body mass index (BMI) 34.0-34.9, adult: Secondary | ICD-10-CM | POA: Diagnosis not present

## 2024-04-10 DIAGNOSIS — Z85118 Personal history of other malignant neoplasm of bronchus and lung: Secondary | ICD-10-CM | POA: Diagnosis not present

## 2024-04-10 DIAGNOSIS — J439 Emphysema, unspecified: Secondary | ICD-10-CM | POA: Diagnosis not present

## 2024-04-10 DIAGNOSIS — F411 Generalized anxiety disorder: Secondary | ICD-10-CM | POA: Diagnosis not present

## 2024-04-10 DIAGNOSIS — K219 Gastro-esophageal reflux disease without esophagitis: Secondary | ICD-10-CM | POA: Diagnosis not present

## 2024-04-10 DIAGNOSIS — Z8249 Family history of ischemic heart disease and other diseases of the circulatory system: Secondary | ICD-10-CM | POA: Diagnosis not present

## 2024-04-10 DIAGNOSIS — I129 Hypertensive chronic kidney disease with stage 1 through stage 4 chronic kidney disease, or unspecified chronic kidney disease: Secondary | ICD-10-CM | POA: Diagnosis not present

## 2024-04-10 DIAGNOSIS — E669 Obesity, unspecified: Secondary | ICD-10-CM | POA: Diagnosis not present

## 2024-04-10 DIAGNOSIS — Z8612 Personal history of poliomyelitis: Secondary | ICD-10-CM | POA: Diagnosis not present

## 2024-04-11 ENCOUNTER — Ambulatory Visit
Admission: RE | Admit: 2024-04-11 | Discharge: 2024-04-11 | Disposition: A | Discharge status: Home / Self Care | Source: Ambulatory Visit | Attending: Urology | Admitting: Urology

## 2024-04-11 DIAGNOSIS — C3412 Malignant neoplasm of upper lobe, left bronchus or lung: Secondary | ICD-10-CM

## 2024-04-11 DIAGNOSIS — C3432 Malignant neoplasm of lower lobe, left bronchus or lung: Secondary | ICD-10-CM | POA: Diagnosis not present

## 2024-04-11 NOTE — Progress Notes (Signed)
 Radiation Oncology         [phone] ________________________________  Name: [name] MRN: [id number]  Date: 04/11/2024  DOB: [birth year]  Post Treatment Note  CC: Purcell Emil Schanz, MD  Purcell Pope, [place]  Diagnosis:   78 y/o woman with putative Stage IA, NSCLC of the LUL lung   Interval Since Last Radiation:  1 year and 2 months  02/13/23 - 02/21/23: SBRT// The target in the LUL lung was treated to 54 Gy in 3 fractions of 18 Gy each.   Narrative:  I spoke with the patient to conduct her routine scheduled follow up visit to review the results of her recent posttreatment CT chest scan via telephone to spare the patient unnecessary potential exposure in the healthcare setting.  The patient was notified in advance and gave permission to proceed with this visit format.  She tolerated the stereotactic body radiotherapy well with only mild fatigue.       Her CT chest scan from 05/05/2023 did show a good response to treatment of the treated nodule in the LUL lung which had decreased in size.  However, it did show a new, small, 4 mm rounded nodule in the LLL, too small to characterize and warranting close monitoring.  Therefore, we got a repeat CT chest scan on 09/13/2023 that showed posttreatment changes in the LLL but no substantial change in size of the additional scattered pulmonary nodules.  There was a new, 2 mm, subsolid nodule in the RLL, nonspecific and likely infectious/inflammatory.  Her most recent CT Chest imaging from 03/29/24 shows a stable appearance of the treatment related changes in the LUL lung and stable appearance of the sub-centimeter nodule in the LLL and 1.2 cm nodule in the RUL with no new or concerning nodules or masses seen. We reviewed these results today by telephone.                     On review of systems, the patient states that she is doing well in general.  She has had a recent cough and wheezing that started 2 weeks ago. She was seen in the  pulmonology office with Dr. Zaida and started on Stiolto daily and provided an albuterol inhaler to use prn. She has noted significant improvement in her symptoms since starting the Stiolto. She denies any fevers, chills, increased shortness of breath, chest pain or hemoptysis.  She has not had recent nausea, vomiting or unintentional weight loss.  Overall, she is pleased with her progress to date.  ALLERGIES:  is allergic to amlodipine, rosuvastatin , sulfa antibiotics, wellbutrin [bupropion], and lisinopril.  Meds: Current Outpatient Medications  Medication Sig Dispense Refill   albuterol (VENTOLIN HFA) 108 (90 Base) MCG/ACT inhaler Inhale 2 puffs into the lungs every 6 (six) hours as needed for wheezing or shortness of breath. 8 g 6   amoxicillin -clavulanate (AUGMENTIN ) 875-125 MG tablet Take 1 tablet by mouth 2 (two) times daily. 20 tablet 0   aspirin  325 MG tablet Take 325 mg by mouth as needed for mild pain or headache.     atorvastatin  (LIPITOR) 40 MG tablet Take 1 tablet (40 mg total) by mouth daily. 90 tablet 3   benzonatate  (TESSALON ) 100 MG capsule Take 1 capsule (100 mg total) by mouth 3 (three) times daily as needed for cough. 30 capsule 2   BIOTIN PO Take 2 capsules by mouth in the morning.     diazepam  (VALIUM ) 5 MG tablet Take 5-15 mg by  mouth daily as needed for anxiety. (Patient not taking: Reported on 04/03/2024)     DULoxetine  (CYMBALTA ) 60 MG capsule TAKE 1 CAPSULE EVERY DAY 90 capsule 3   empagliflozin  (JARDIANCE ) 10 MG TABS tablet Take 1 tablet (10 mg total) by mouth daily before breakfast. 90 tablet 3   Homeopathic Products (LEG CRAMP RELIEF) SUBL Place 1 tablet under the tongue daily as needed (leg cramps). [employer]     hydrochlorothiazide  (HYDRODIURIL ) 25 MG tablet Take 1 tablet (25 mg total) by mouth daily. 90 tablet 3   Magnesium Bisglycinate (MAG GLYCINATE) 100 MG TABS Take 300 mg by mouth at bedtime.     oxyCODONE -acetaminophen  (PERCOCET) 5-325 MG tablet Take 1  tablet by mouth every 6 (six) hours as needed. (Patient not taking: Reported on 04/03/2024) 20 tablet 0   predniSONE  (DELTASONE ) 10 MG tablet Take 4 tablets daily times 6 days with food (Patient not taking: Reported on 04/03/2024) 24 tablet 0   rosuvastatin  (CRESTOR ) 20 MG tablet Take 1 tablet (20 mg total) by mouth daily. (Patient not taking: Reported on 04/03/2024) 90 tablet 3   tiZANidine (ZANAFLEX) 2 MG tablet Take 1 tablet (2 mg total) by mouth every 8 (eight) hours as needed for muscle spasms. 30 tablet 1   zolpidem  (AMBIEN ) 5 MG tablet TAKE 1 TABLET BY MOUTH AT BEDTIME AS NEEDED FOR SLEEP 30 tablet 0   Current Facility-Administered Medications  Medication Dose Route Frequency Provider Last Rate Last Admin   0.9 %  sodium chloride  infusion  500 mL Intravenous Once Cirigliano, Vito V, DO        Physical Findings:  vitals were not taken for this visit.   /10 Unable to assess due to telephone follow-up visit format.  Lab Findings: Lab Results  Component Value Date   WBC 5.2 07/22/2023   HGB 12.5 07/22/2023   HCT 37.5 07/22/2023   MCV 90.8 07/22/2023   PLT 307 07/22/2023     Radiographic Findings: CT Chest W Contrast Result Date: 04/01/2024 CLINICAL DATA:  Non-small-cell lung cancer restaging, status post SBRT * Tracking Code: BO * EXAM: CT CHEST WITH CONTRAST TECHNIQUE: Multidetector CT imaging of the chest was performed during intravenous contrast administration. RADIATION DOSE REDUCTION: This exam was performed according to the departmental dose-optimization program which includes automated exposure control, adjustment of the mA and/or kV according to patient size and/or use of iterative reconstruction technique. CONTRAST:  75mL OMNIPAQUE  IOHEXOL  300 MG/ML  SOLN COMPARISON:  09/13/2023 FINDINGS: Cardiovascular: Aortic atherosclerosis. 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 Normal heart size. No pericardial effusion. Mediastinum/Nodes: No  enlarged mediastinal, hilar, or axillary lymph nodes. Thyroid  gland, trachea, and esophagus demonstrate no significant findings. Lungs/Pleura: No significant change in heterogeneous consolidation in a bandlike distribution in the peripheral left upper lobe (series 8, image 38). Unchanged nodular opacity in the posterior right upper lobe measuring 1.2 x 0.7 cm (series 8, image 31). Unchanged subsolid nodule in the dependent left lower lobe measuring 0.4 cm (series 8, image 61). Unchanged benign calcified nodule in the anterior right middle lobe. Moderate centrilobular and paraseptal emphysema. No pleural effusion or pneumothorax. Upper Abdomen: No acute abnormality. Musculoskeletal: No chest wall abnormality. No acute osseous findings. IMPRESSION: 1. No significant change in heterogeneous consolidation in a bandlike distribution in the peripheral left upper lobe, consistent with radiation pneumonitis/fibrosis. Although there has been little interval change this may continue to evolve given appearance. Attention on follow-up. 2. Unchanged nodular opacity in the posterior right  upper lobe measuring 1.2 x 0.7 cm. 3. Unchanged subsolid nodule in the dependent left lower lobe measuring 0.4 cm. 4. Emphysema. Aortic Atherosclerosis (ICD10-I70.0) and Emphysema (ICD10-J43.9). Electronically Signed   By: Marolyn JONETTA Jaksch M.D.   On: 04/01/2024 16:00   XR Lumbar Spine 2-3 Views Result Date: 03/18/2024 2 views of the lumbar spine showed significant degenerative changes at multiple levels especially at L5-S1.   Impression/Plan: 1. 78 y/o woman with putative Stage IA, NSCLC of the LUL lung   She has recovered well from the effects of her SBRT and is currently without complaints.  Her recent posttreatment CT chest scan from 03/29/24 shows a stable appearance of the treatment related changes in the LUL lung and stable appearance of the sub-centimeter nodule in the LLL and 1.2 cm nodule in the RUL with no new or concerning  nodules or masses seen.  We reviewed these results today and the recommendation to continue to monitor closely with serial CT chest scans every 6 months until we reach 5 years disease-free.  I will plan to contact her by telephone following each scan to review the results and recommendations.  She appears to have a good understanding of her disease and is comfortable and in agreement with the stated plan.  She knows that she is welcome to call at anytime in the interim with any questions or concerns and she will continue routine follow up with pulmonology for management of her underlying COPD.  I personally spent 30 minutes in this encounter including chart review, reviewing radiological studies, telephone conversation with the patient, entering orders and completing documentation.     Sabra MICAEL Rusk, PA-C

## 2024-05-02 NOTE — Telephone Encounter (Signed)
 PCP removed.

## 2024-05-06 ENCOUNTER — Other Ambulatory Visit: Payer: Self-pay | Admitting: Emergency Medicine

## 2024-05-06 DIAGNOSIS — I10 Essential (primary) hypertension: Secondary | ICD-10-CM

## 2024-05-24 ENCOUNTER — Other Ambulatory Visit: Payer: Self-pay | Admitting: Orthopaedic Surgery

## 2024-06-03 ENCOUNTER — Other Ambulatory Visit: Payer: Self-pay | Admitting: Emergency Medicine

## 2024-07-25 ENCOUNTER — Ambulatory Visit: Admitting: Orthopaedic Surgery

## 2024-10-15 ENCOUNTER — Ambulatory Visit: Admitting: Dermatology

## 2024-10-16 ENCOUNTER — Ambulatory Visit: Admitting: Urology
